# Patient Record
Sex: Male | Born: 1960 | Race: White | Hispanic: No | Marital: Married | Smoking: Never smoker
Health system: Southern US, Community
[De-identification: ages and names within clinical notes are randomized; demographics above are authoritative.]

## PROBLEM LIST (undated history)

## (undated) DIAGNOSIS — E119 Type 2 diabetes mellitus without complications: Secondary | ICD-10-CM

## (undated) DIAGNOSIS — G629 Polyneuropathy, unspecified: Secondary | ICD-10-CM

---

## 2008-07-16 ENCOUNTER — Encounter: Admission: RE | Admit: 2008-07-16 | Discharge: 2008-07-16 | Payer: Self-pay | Admitting: Orthopedic Surgery

## 2008-08-02 ENCOUNTER — Encounter: Admission: RE | Admit: 2008-08-02 | Discharge: 2008-08-02 | Payer: Self-pay | Admitting: Orthopedic Surgery

## 2016-03-28 DIAGNOSIS — E119 Type 2 diabetes mellitus without complications: Secondary | ICD-10-CM

## 2016-03-28 DIAGNOSIS — J181 Lobar pneumonia, unspecified organism: Secondary | ICD-10-CM

## 2016-03-28 DIAGNOSIS — J9691 Respiratory failure, unspecified with hypoxia: Secondary | ICD-10-CM

## 2016-03-28 DIAGNOSIS — R112 Nausea with vomiting, unspecified: Secondary | ICD-10-CM | POA: Diagnosis not present

## 2016-03-28 DIAGNOSIS — D72829 Elevated white blood cell count, unspecified: Secondary | ICD-10-CM

## 2016-03-29 DIAGNOSIS — D72829 Elevated white blood cell count, unspecified: Secondary | ICD-10-CM | POA: Diagnosis not present

## 2016-03-29 DIAGNOSIS — R112 Nausea with vomiting, unspecified: Secondary | ICD-10-CM | POA: Diagnosis not present

## 2016-03-29 DIAGNOSIS — J181 Lobar pneumonia, unspecified organism: Secondary | ICD-10-CM | POA: Diagnosis not present

## 2016-03-29 DIAGNOSIS — J9691 Respiratory failure, unspecified with hypoxia: Secondary | ICD-10-CM | POA: Diagnosis not present

## 2016-03-30 DIAGNOSIS — E119 Type 2 diabetes mellitus without complications: Secondary | ICD-10-CM

## 2016-03-30 DIAGNOSIS — I10 Essential (primary) hypertension: Secondary | ICD-10-CM

## 2016-03-30 DIAGNOSIS — J181 Lobar pneumonia, unspecified organism: Secondary | ICD-10-CM

## 2016-03-30 DIAGNOSIS — D72829 Elevated white blood cell count, unspecified: Secondary | ICD-10-CM

## 2016-03-30 DIAGNOSIS — R05 Cough: Secondary | ICD-10-CM

## 2016-03-30 DIAGNOSIS — J9691 Respiratory failure, unspecified with hypoxia: Secondary | ICD-10-CM

## 2016-03-30 DIAGNOSIS — R112 Nausea with vomiting, unspecified: Secondary | ICD-10-CM

## 2019-03-24 ENCOUNTER — Inpatient Hospital Stay: Admit: 2019-03-24 | Payer: Commercial Managed Care - PPO | Admitting: Family Medicine

## 2019-03-25 ENCOUNTER — Inpatient Hospital Stay (HOSPITAL_COMMUNITY): Payer: PRIVATE HEALTH INSURANCE

## 2019-03-25 ENCOUNTER — Inpatient Hospital Stay: Payer: Self-pay

## 2019-03-25 ENCOUNTER — Inpatient Hospital Stay (HOSPITAL_COMMUNITY)
Admission: AD | Admit: 2019-03-25 | Discharge: 2019-05-31 | DRG: 004 | Disposition: E | Payer: PRIVATE HEALTH INSURANCE | Source: Other Acute Inpatient Hospital | Attending: Pulmonary Disease | Admitting: Pulmonary Disease

## 2019-03-25 DIAGNOSIS — A4189 Other specified sepsis: Principal | ICD-10-CM | POA: Diagnosis present

## 2019-03-25 DIAGNOSIS — G9341 Metabolic encephalopathy: Secondary | ICD-10-CM | POA: Diagnosis not present

## 2019-03-25 DIAGNOSIS — I10 Essential (primary) hypertension: Secondary | ICD-10-CM | POA: Diagnosis present

## 2019-03-25 DIAGNOSIS — Z930 Tracheostomy status: Secondary | ICD-10-CM

## 2019-03-25 DIAGNOSIS — E8770 Fluid overload, unspecified: Secondary | ICD-10-CM | POA: Diagnosis not present

## 2019-03-25 DIAGNOSIS — E876 Hypokalemia: Secondary | ICD-10-CM | POA: Diagnosis present

## 2019-03-25 DIAGNOSIS — J15211 Pneumonia due to Methicillin susceptible Staphylococcus aureus: Secondary | ICD-10-CM | POA: Diagnosis present

## 2019-03-25 DIAGNOSIS — Z66 Do not resuscitate: Secondary | ICD-10-CM | POA: Diagnosis not present

## 2019-03-25 DIAGNOSIS — E669 Obesity, unspecified: Secondary | ICD-10-CM | POA: Diagnosis present

## 2019-03-25 DIAGNOSIS — E875 Hyperkalemia: Secondary | ICD-10-CM | POA: Diagnosis not present

## 2019-03-25 DIAGNOSIS — T380X5A Adverse effect of glucocorticoids and synthetic analogues, initial encounter: Secondary | ICD-10-CM | POA: Diagnosis present

## 2019-03-25 DIAGNOSIS — Z794 Long term (current) use of insulin: Secondary | ICD-10-CM

## 2019-03-25 DIAGNOSIS — Z6834 Body mass index (BMI) 34.0-34.9, adult: Secondary | ICD-10-CM | POA: Diagnosis not present

## 2019-03-25 DIAGNOSIS — J1289 Other viral pneumonia: Secondary | ICD-10-CM | POA: Diagnosis present

## 2019-03-25 DIAGNOSIS — G9340 Encephalopathy, unspecified: Secondary | ICD-10-CM | POA: Diagnosis not present

## 2019-03-25 DIAGNOSIS — J9501 Hemorrhage from tracheostomy stoma: Secondary | ICD-10-CM | POA: Diagnosis not present

## 2019-03-25 DIAGNOSIS — I4891 Unspecified atrial fibrillation: Secondary | ICD-10-CM | POA: Diagnosis not present

## 2019-03-25 DIAGNOSIS — D6489 Other specified anemias: Secondary | ICD-10-CM | POA: Diagnosis not present

## 2019-03-25 DIAGNOSIS — E870 Hyperosmolality and hypernatremia: Secondary | ICD-10-CM | POA: Diagnosis not present

## 2019-03-25 DIAGNOSIS — E785 Hyperlipidemia, unspecified: Secondary | ICD-10-CM | POA: Diagnosis present

## 2019-03-25 DIAGNOSIS — J810 Acute pulmonary edema: Secondary | ICD-10-CM | POA: Diagnosis not present

## 2019-03-25 DIAGNOSIS — Z9289 Personal history of other medical treatment: Secondary | ICD-10-CM

## 2019-03-25 DIAGNOSIS — Z79899 Other long term (current) drug therapy: Secondary | ICD-10-CM

## 2019-03-25 DIAGNOSIS — Z9911 Dependence on respirator [ventilator] status: Secondary | ICD-10-CM | POA: Diagnosis not present

## 2019-03-25 DIAGNOSIS — E860 Dehydration: Secondary | ICD-10-CM | POA: Diagnosis not present

## 2019-03-25 DIAGNOSIS — L8990 Pressure ulcer of unspecified site, unspecified stage: Secondary | ICD-10-CM | POA: Insufficient documentation

## 2019-03-25 DIAGNOSIS — M7989 Other specified soft tissue disorders: Secondary | ICD-10-CM | POA: Diagnosis not present

## 2019-03-25 DIAGNOSIS — E440 Moderate protein-calorie malnutrition: Secondary | ICD-10-CM | POA: Diagnosis present

## 2019-03-25 DIAGNOSIS — J9601 Acute respiratory failure with hypoxia: Secondary | ICD-10-CM

## 2019-03-25 DIAGNOSIS — J151 Pneumonia due to Pseudomonas: Secondary | ICD-10-CM | POA: Diagnosis present

## 2019-03-25 DIAGNOSIS — E874 Mixed disorder of acid-base balance: Secondary | ICD-10-CM | POA: Diagnosis present

## 2019-03-25 DIAGNOSIS — Z9049 Acquired absence of other specified parts of digestive tract: Secondary | ICD-10-CM | POA: Diagnosis not present

## 2019-03-25 DIAGNOSIS — R6521 Severe sepsis with septic shock: Secondary | ICD-10-CM | POA: Diagnosis present

## 2019-03-25 DIAGNOSIS — Z833 Family history of diabetes mellitus: Secondary | ICD-10-CM | POA: Diagnosis not present

## 2019-03-25 DIAGNOSIS — J9690 Respiratory failure, unspecified, unspecified whether with hypoxia or hypercapnia: Secondary | ICD-10-CM

## 2019-03-25 DIAGNOSIS — R009 Unspecified abnormalities of heart beat: Secondary | ICD-10-CM | POA: Diagnosis not present

## 2019-03-25 DIAGNOSIS — J9600 Acute respiratory failure, unspecified whether with hypoxia or hypercapnia: Secondary | ICD-10-CM

## 2019-03-25 DIAGNOSIS — Z0189 Encounter for other specified special examinations: Secondary | ICD-10-CM

## 2019-03-25 DIAGNOSIS — Y848 Other medical procedures as the cause of abnormal reaction of the patient, or of later complication, without mention of misadventure at the time of the procedure: Secondary | ICD-10-CM | POA: Diagnosis not present

## 2019-03-25 DIAGNOSIS — E11649 Type 2 diabetes mellitus with hypoglycemia without coma: Secondary | ICD-10-CM | POA: Diagnosis not present

## 2019-03-25 DIAGNOSIS — N179 Acute kidney failure, unspecified: Secondary | ICD-10-CM | POA: Diagnosis present

## 2019-03-25 DIAGNOSIS — Z4659 Encounter for fitting and adjustment of other gastrointestinal appliance and device: Secondary | ICD-10-CM | POA: Diagnosis not present

## 2019-03-25 DIAGNOSIS — J80 Acute respiratory distress syndrome: Secondary | ICD-10-CM | POA: Diagnosis not present

## 2019-03-25 DIAGNOSIS — E1165 Type 2 diabetes mellitus with hyperglycemia: Secondary | ICD-10-CM | POA: Diagnosis present

## 2019-03-25 DIAGNOSIS — R0600 Dyspnea, unspecified: Secondary | ICD-10-CM | POA: Diagnosis not present

## 2019-03-25 DIAGNOSIS — U071 COVID-19: Secondary | ICD-10-CM

## 2019-03-25 DIAGNOSIS — Z7982 Long term (current) use of aspirin: Secondary | ICD-10-CM

## 2019-03-25 DIAGNOSIS — J9602 Acute respiratory failure with hypercapnia: Secondary | ICD-10-CM | POA: Diagnosis not present

## 2019-03-25 DIAGNOSIS — J189 Pneumonia, unspecified organism: Secondary | ICD-10-CM | POA: Diagnosis not present

## 2019-03-25 DIAGNOSIS — Z0184 Encounter for antibody response examination: Secondary | ICD-10-CM

## 2019-03-25 DIAGNOSIS — A498 Other bacterial infections of unspecified site: Secondary | ICD-10-CM | POA: Diagnosis not present

## 2019-03-25 DIAGNOSIS — L89156 Pressure-induced deep tissue damage of sacral region: Secondary | ICD-10-CM | POA: Diagnosis not present

## 2019-03-25 DIAGNOSIS — R509 Fever, unspecified: Secondary | ICD-10-CM | POA: Diagnosis not present

## 2019-03-25 DIAGNOSIS — Z452 Encounter for adjustment and management of vascular access device: Secondary | ICD-10-CM

## 2019-03-25 DIAGNOSIS — Z7189 Other specified counseling: Secondary | ICD-10-CM | POA: Diagnosis not present

## 2019-03-25 DIAGNOSIS — J1282 Pneumonia due to coronavirus disease 2019: Secondary | ICD-10-CM | POA: Diagnosis present

## 2019-03-25 DIAGNOSIS — L89892 Pressure ulcer of other site, stage 2: Secondary | ICD-10-CM | POA: Diagnosis not present

## 2019-03-25 HISTORY — DX: Type 2 diabetes mellitus without complications: E11.9

## 2019-03-25 HISTORY — DX: Polyneuropathy, unspecified: G62.9

## 2019-03-25 LAB — CBC WITH DIFFERENTIAL/PLATELET
Abs Immature Granulocytes: 0.93 10*3/uL — ABNORMAL HIGH (ref 0.00–0.07)
Basophils Absolute: 0 10*3/uL (ref 0.0–0.1)
Basophils Relative: 0 %
Eosinophils Absolute: 0 10*3/uL (ref 0.0–0.5)
Eosinophils Relative: 0 %
HCT: 37.1 % — ABNORMAL LOW (ref 39.0–52.0)
Hemoglobin: 11.5 g/dL — ABNORMAL LOW (ref 13.0–17.0)
Immature Granulocytes: 4 %
Lymphocytes Relative: 6 %
Lymphs Abs: 1.4 10*3/uL (ref 0.7–4.0)
MCH: 28.5 pg (ref 26.0–34.0)
MCHC: 31 g/dL (ref 30.0–36.0)
MCV: 91.8 fL (ref 80.0–100.0)
Monocytes Absolute: 1.3 10*3/uL — ABNORMAL HIGH (ref 0.1–1.0)
Monocytes Relative: 5 %
Neutro Abs: 21.5 10*3/uL — ABNORMAL HIGH (ref 1.7–7.7)
Neutrophils Relative %: 85 %
Platelets: 625 10*3/uL — ABNORMAL HIGH (ref 150–400)
RBC: 4.04 MIL/uL — ABNORMAL LOW (ref 4.22–5.81)
RDW: 14.1 % (ref 11.5–15.5)
WBC: 25.2 10*3/uL — ABNORMAL HIGH (ref 4.0–10.5)
nRBC: 0.1 % (ref 0.0–0.2)

## 2019-03-25 LAB — COMPREHENSIVE METABOLIC PANEL
ALT: 18 U/L (ref 0–44)
AST: 28 U/L (ref 15–41)
Albumin: 2.7 g/dL — ABNORMAL LOW (ref 3.5–5.0)
Alkaline Phosphatase: 106 U/L (ref 38–126)
Anion gap: 9 (ref 5–15)
BUN: 14 mg/dL (ref 6–20)
CO2: 25 mmol/L (ref 22–32)
Calcium: 8.1 mg/dL — ABNORMAL LOW (ref 8.9–10.3)
Chloride: 102 mmol/L (ref 98–111)
Creatinine, Ser: 0.83 mg/dL (ref 0.61–1.24)
GFR calc Af Amer: >60 mL/min (ref >60)
GFR calc non Af Amer: >60 mL/min (ref >60)
Glucose, Bld: 294 mg/dL — ABNORMAL HIGH (ref 70–99)
Potassium: 5.4 mmol/L — ABNORMAL HIGH (ref 3.5–5.1)
Sodium: 136 mmol/L (ref 135–145)
Total Bilirubin: 0.4 mg/dL (ref 0.3–1.2)
Total Protein: 7.2 g/dL (ref 6.5–8.1)

## 2019-03-25 LAB — POCT I-STAT 7, (LYTES, BLD GAS, ICA,H+H)
Acid-base deficit: 2 mmol/L (ref 0.0–2.0)
Acid-base deficit: 6 mmol/L — ABNORMAL HIGH (ref 0.0–2.0)
Acid-base deficit: 3 mmol/L — ABNORMAL HIGH (ref 0.0–2.0)
Bicarbonate: 23.6 mmol/L (ref 20.0–28.0)
Bicarbonate: 26 mmol/L (ref 20.0–28.0)
Bicarbonate: 23.9 mmol/L (ref 20.0–28.0)
Calcium, Ion: 1.15 mmol/L (ref 1.15–1.40)
Calcium, Ion: 1.24 mmol/L (ref 1.15–1.40)
Calcium, Ion: 1.17 mmol/L (ref 1.15–1.40)
HCT: 31 % — ABNORMAL LOW (ref 39.0–52.0)
HCT: 37 % — ABNORMAL LOW (ref 39.0–52.0)
HCT: 31 % — ABNORMAL LOW (ref 39.0–52.0)
Hemoglobin: 10.5 g/dL — ABNORMAL LOW (ref 13.0–17.0)
Hemoglobin: 12.6 g/dL — ABNORMAL LOW (ref 13.0–17.0)
Hemoglobin: 10.5 g/dL — ABNORMAL LOW (ref 13.0–17.0)
O2 Saturation: 88 %
O2 Saturation: 97 %
O2 Saturation: 91 %
Patient temperature: 36.3
Patient temperature: 37.3
Patient temperature: 37.4
Potassium: 5.1 mmol/L (ref 3.5–5.1)
Potassium: 5.2 mmol/L — ABNORMAL HIGH (ref 3.5–5.1)
Potassium: 5.5 mmol/L — ABNORMAL HIGH (ref 3.5–5.1)
Sodium: 135 mmol/L (ref 135–145)
Sodium: 138 mmol/L (ref 135–145)
Sodium: 136 mmol/L (ref 135–145)
TCO2: 25 mmol/L (ref 22–32)
TCO2: 29 mmol/L (ref 22–32)
TCO2: 25 mmol/L (ref 22–32)
pCO2 arterial: 43.6 mmHg (ref 32.0–48.0)
pCO2 arterial: 87.7 mmHg (ref 32.0–48.0)
pCO2 arterial: 52.3 mmHg — ABNORMAL HIGH (ref 32.0–48.0)
pH, Arterial: 7.337 — ABNORMAL LOW (ref 7.350–7.450)
pH, Arterial: 7.082 — CL (ref 7.350–7.450)
pH, Arterial: 7.269 — ABNORMAL LOW (ref 7.350–7.450)
pO2, Arterial: 56 mmHg — ABNORMAL LOW (ref 83.0–108.0)
pO2, Arterial: 127 mmHg — ABNORMAL HIGH (ref 83.0–108.0)
pO2, Arterial: 72 mmHg — ABNORMAL LOW (ref 83.0–108.0)

## 2019-03-25 LAB — FERRITIN: Ferritin: 235 ng/mL (ref 24–336)

## 2019-03-25 LAB — GLUCOSE, CAPILLARY
Glucose-Capillary: 284 mg/dL — ABNORMAL HIGH (ref 70–99)
Glucose-Capillary: 288 mg/dL — ABNORMAL HIGH (ref 70–99)
Glucose-Capillary: 281 mg/dL — ABNORMAL HIGH (ref 70–99)
Glucose-Capillary: 309 mg/dL — ABNORMAL HIGH (ref 70–99)

## 2019-03-25 LAB — PROCALCITONIN: Procalcitonin: 0.27 ng/mL

## 2019-03-25 LAB — C-REACTIVE PROTEIN: CRP: 23.1 mg/dL — ABNORMAL HIGH (ref <1.0)

## 2019-03-25 LAB — D-DIMER, QUANTITATIVE: D-Dimer, Quant: 5.71 ug/mL-FEU — ABNORMAL HIGH (ref 0.00–0.50)

## 2019-03-25 LAB — HEMOGLOBIN A1C
Hgb A1c MFr Bld: 7.5 % — ABNORMAL HIGH (ref 4.8–5.6)
Mean Plasma Glucose: 168.55 mg/dL

## 2019-03-25 LAB — ABO/RH: ABO/RH(D): A POS

## 2019-03-25 LAB — HIV ANTIBODY (ROUTINE TESTING W REFLEX): HIV Screen 4th Generation wRfx: NONREACTIVE

## 2019-03-25 MED ORDER — INSULIN ASPART 100 UNIT/ML ~~LOC~~ SOLN
0.0000 [IU] | SUBCUTANEOUS | Status: DC
  Administered 2019-03-25: 15 [IU] via SUBCUTANEOUS
  Administered 2019-03-25 – 2019-03-26 (×4): 11 [IU] via SUBCUTANEOUS
  Administered 2019-03-26 (×3): 7 [IU] via SUBCUTANEOUS
  Administered 2019-03-26: 11 [IU] via SUBCUTANEOUS
  Administered 2019-03-27: 15 [IU] via SUBCUTANEOUS
  Administered 2019-03-27: 7 [IU] via SUBCUTANEOUS
  Administered 2019-03-27: 20 [IU] via SUBCUTANEOUS
  Administered 2019-03-27 (×2): 11 [IU] via SUBCUTANEOUS
  Administered 2019-03-27: 15 [IU] via SUBCUTANEOUS
  Administered 2019-03-28: 11 [IU] via SUBCUTANEOUS
  Administered 2019-03-28: 4 [IU] via SUBCUTANEOUS
  Administered 2019-03-28: 16:00:00 20 [IU] via SUBCUTANEOUS
  Administered 2019-03-28: 11 [IU] via SUBCUTANEOUS
  Administered 2019-03-28: 20:00:00 15 [IU] via SUBCUTANEOUS
  Administered 2019-03-28: 11 [IU] via SUBCUTANEOUS
  Administered 2019-03-28: 15 [IU] via SUBCUTANEOUS
  Administered 2019-03-29: 20 [IU] via SUBCUTANEOUS
  Administered 2019-03-29 (×4): 15 [IU] via SUBCUTANEOUS
  Administered 2019-03-30 (×4): 20 [IU] via SUBCUTANEOUS

## 2019-03-25 MED ORDER — VECURONIUM BROMIDE 10 MG IV SOLR
INTRAVENOUS | Status: AC
  Administered 2019-03-25: 10 mg via INTRAVENOUS
  Filled 2019-03-25: qty 10

## 2019-03-25 MED ORDER — ONDANSETRON HCL 4 MG/2ML IJ SOLN: 4.0000 mg | Freq: Four times a day (QID) | INTRAMUSCULAR | Status: DC | PRN

## 2019-03-25 MED ORDER — HYDROMORPHONE BOLUS VIA INFUSION: 0.5000 mg | INTRAVENOUS | Status: DC | PRN

## 2019-03-25 MED ORDER — ZINC SULFATE 220 (50 ZN) MG PO CAPS
220.0000 mg | ORAL_CAPSULE | Freq: Every day | ORAL | Status: DC
  Administered 2019-03-25: 220 mg via ORAL
  Filled 2019-03-25: qty 1

## 2019-03-25 MED ORDER — CLONAZEPAM 0.1 MG/ML ORAL SUSPENSION
1.0000 mg | Freq: Two times a day (BID) | ORAL | Status: DC
  Filled 2019-03-25 (×3): qty 10

## 2019-03-25 MED ORDER — SODIUM CHLORIDE 0.9 % IV SOLN: 0.5000 mg/h | INTRAVENOUS | Status: DC

## 2019-03-25 MED ORDER — ACETAMINOPHEN 325 MG PO TABS: 650.0000 mg | ORAL_TABLET | Freq: Four times a day (QID) | ORAL | Status: DC | PRN

## 2019-03-25 MED ORDER — HYDROCOD POLST-CPM POLST ER 10-8 MG/5ML PO SUER: 5.0000 mL | Freq: Two times a day (BID) | ORAL | Status: DC | PRN

## 2019-03-25 MED ORDER — VECURONIUM BROMIDE 10 MG IV SOLR
0.0000 ug/kg/min | INTRAVENOUS | Status: DC
  Administered 2019-03-25: 1 ug/kg/min via INTRAVENOUS
  Administered 2019-03-26: 0.7 ug/kg/min via INTRAVENOUS
  Filled 2019-03-25 (×4): qty 100

## 2019-03-25 MED ORDER — VITAL HIGH PROTEIN PO LIQD
1000.0000 mL | ORAL | Status: DC
  Administered 2019-03-25: 09:00:00 1000 mL

## 2019-03-25 MED ORDER — VITAL AF 1.2 CAL PO LIQD
1000.0000 mL | ORAL | Status: DC
  Administered 2019-03-26 – 2019-03-27 (×2): 1000 mL

## 2019-03-25 MED ORDER — FENTANYL 2500MCG IN NS 250ML (10MCG/ML) PREMIX INFUSION
50.0000 ug/h | INTRAVENOUS | Status: DC
  Administered 2019-03-25: 250 ug/h via INTRAVENOUS
  Administered 2019-03-25 – 2019-03-27 (×4): 200 ug/h via INTRAVENOUS
  Filled 2019-03-25 (×3): qty 250

## 2019-03-25 MED ORDER — FENTANYL BOLUS VIA INFUSION
50.0000 ug | INTRAVENOUS | Status: DC | PRN
  Filled 2019-03-25: qty 50

## 2019-03-25 MED ORDER — CLONAZEPAM 1 MG PO TABS
1.0000 mg | ORAL_TABLET | Freq: Two times a day (BID) | ORAL | Status: DC
  Administered 2019-03-26 – 2019-04-15 (×40): 1 mg
  Filled 2019-03-25 (×41): qty 1

## 2019-03-25 MED ORDER — CHLORHEXIDINE GLUCONATE 0.12 % MT SOLN
15.0000 mL | Freq: Two times a day (BID) | OROMUCOSAL | Status: DC
  Administered 2019-03-25 – 2019-04-30 (×72): 15 mL via OROMUCOSAL
  Filled 2019-03-25 (×40): qty 15

## 2019-03-25 MED ORDER — VITAMIN C 500 MG PO TABS
500.0000 mg | ORAL_TABLET | Freq: Every day | ORAL | Status: DC
  Administered 2019-03-26 – 2019-04-02 (×8): 500 mg
  Filled 2019-03-25 (×8): qty 1

## 2019-03-25 MED ORDER — SENNA 8.6 MG PO TABS
1.0000 | ORAL_TABLET | Freq: Two times a day (BID) | ORAL | Status: DC
  Administered 2019-03-26 – 2019-03-28 (×6): 8.6 mg
  Filled 2019-03-25 (×6): qty 1

## 2019-03-25 MED ORDER — DEXAMETHASONE SODIUM PHOSPHATE 10 MG/ML IJ SOLN
6.0000 mg | INTRAMUSCULAR | Status: DC
  Administered 2019-03-25 – 2019-03-30 (×6): 6 mg via INTRAVENOUS
  Filled 2019-03-25 (×5): qty 1

## 2019-03-25 MED ORDER — MIDAZOLAM HCL 2 MG/2ML IJ SOLN: 2.0000 mg | INTRAMUSCULAR | Status: DC | PRN

## 2019-03-25 MED ORDER — ORAL CARE MOUTH RINSE
15.0000 mL | OROMUCOSAL | Status: DC
  Administered 2019-03-25 – 2019-04-30 (×365): 15 mL via OROMUCOSAL

## 2019-03-25 MED ORDER — MIDAZOLAM 50MG/50ML (1MG/ML) PREMIX INFUSION
0.5000 mg/h | INTRAVENOUS | Status: DC
  Filled 2019-03-25: qty 50

## 2019-03-25 MED ORDER — PRO-STAT SUGAR FREE PO LIQD
30.0000 mL | Freq: Two times a day (BID) | ORAL | Status: DC
  Administered 2019-03-25: 09:00:00 30 mL
  Filled 2019-03-25: qty 30

## 2019-03-25 MED ORDER — PRO-STAT SUGAR FREE PO LIQD
60.0000 mL | Freq: Three times a day (TID) | ORAL | Status: DC
  Administered 2019-03-26 – 2019-03-28 (×7): 60 mL
  Filled 2019-03-25 (×7): qty 60

## 2019-03-25 MED ORDER — FENTANYL 2500MCG IN NS 250ML (10MCG/ML) PREMIX INFUSION
0.0000 ug/h | INTRAVENOUS | Status: DC
  Administered 2019-03-25: 100 ug/h via INTRAVENOUS
  Filled 2019-03-25 (×2): qty 250

## 2019-03-25 MED ORDER — ONDANSETRON HCL 4 MG PO TABS: 4.0000 mg | ORAL_TABLET | Freq: Four times a day (QID) | ORAL | Status: DC | PRN

## 2019-03-25 MED ORDER — SODIUM CHLORIDE 0.9 % IV SOLN
100.0000 mg | INTRAVENOUS | Status: AC
  Administered 2019-03-26 – 2019-03-29 (×4): 100 mg via INTRAVENOUS
  Filled 2019-03-25 (×4): qty 20

## 2019-03-25 MED ORDER — VITAMIN C 500 MG PO TABS
500.0000 mg | ORAL_TABLET | Freq: Every day | ORAL | Status: DC
  Administered 2019-03-25: 500 mg via ORAL
  Filled 2019-03-25: qty 1

## 2019-03-25 MED ORDER — HYDROCOD POLST-CPM POLST ER 10-8 MG/5ML PO SUER
5.0000 mL | Freq: Two times a day (BID) | ORAL | Status: DC | PRN
  Administered 2019-03-27 – 2019-04-13 (×2): 5 mL
  Filled 2019-03-25 (×2): qty 5

## 2019-03-25 MED ORDER — OXYCODONE HCL 5 MG/5ML PO SOLN
5.0000 mg | Freq: Four times a day (QID) | ORAL | Status: DC
  Administered 2019-03-26 – 2019-03-27 (×5): 5 mg via ORAL
  Filled 2019-03-25 (×6): qty 5

## 2019-03-25 MED ORDER — GUAIFENESIN-DM 100-10 MG/5ML PO SYRP: 10.0000 mL | ORAL_SOLUTION | ORAL | Status: DC | PRN

## 2019-03-25 MED ORDER — ARTIFICIAL TEARS OPHTHALMIC OINT
1.0000 "application " | TOPICAL_OINTMENT | Freq: Three times a day (TID) | OPHTHALMIC | Status: DC
  Administered 2019-03-25 – 2019-03-27 (×6): 1 via OPHTHALMIC
  Filled 2019-03-25: qty 3.5

## 2019-03-25 MED ORDER — BISACODYL 10 MG RE SUPP
10.0000 mg | Freq: Every day | RECTAL | Status: DC | PRN
  Filled 2019-03-25: qty 1

## 2019-03-25 MED ORDER — NOREPINEPHRINE 4 MG/250ML-% IV SOLN
0.0000 ug/min | INTRAVENOUS | Status: DC
  Administered 2019-03-25 – 2019-03-29 (×2): 2 ug/min via INTRAVENOUS
  Administered 2019-03-30: 8 ug/min via INTRAVENOUS
  Administered 2019-03-31: 2 ug/min via INTRAVENOUS
  Administered 2019-04-03: 3 ug/min via INTRAVENOUS
  Administered 2019-04-03: 4 ug/min via INTRAVENOUS
  Administered 2019-04-04: 2 ug/min via INTRAVENOUS
  Filled 2019-03-25 (×7): qty 250

## 2019-03-25 MED ORDER — CHLORHEXIDINE GLUCONATE 0.12% ORAL RINSE (MEDLINE KIT)
15.0000 mL | Freq: Two times a day (BID) | OROMUCOSAL | Status: DC
  Administered 2019-03-25: 15 mL via OROMUCOSAL

## 2019-03-25 MED ORDER — SODIUM CHLORIDE 0.9% IV SOLUTION
Freq: Once | INTRAVENOUS | Status: AC
  Administered 2019-03-25: 10 mL/h via INTRAVENOUS

## 2019-03-25 MED ORDER — ACETAMINOPHEN 325 MG PO TABS
650.0000 mg | ORAL_TABLET | Freq: Four times a day (QID) | ORAL | Status: DC | PRN
  Administered 2019-03-26 – 2019-04-01 (×16): 650 mg
  Filled 2019-03-25 (×16): qty 2

## 2019-03-25 MED ORDER — MIDAZOLAM BOLUS VIA INFUSION
1.0000 mg | INTRAVENOUS | Status: DC | PRN
  Administered 2019-03-25: 2 mg via INTRAVENOUS
  Filled 2019-03-25: qty 2

## 2019-03-25 MED ORDER — VECURONIUM BROMIDE 10 MG IV SOLR
10.0000 mg | Freq: Once | INTRAVENOUS | Status: AC
  Administered 2019-03-25: 12:00:00 10 mg via INTRAVENOUS

## 2019-03-25 MED ORDER — HYDROMORPHONE HCL 1 MG/ML IJ SOLN: 1.0000 mg | Freq: Once | INTRAMUSCULAR | Status: DC

## 2019-03-25 MED ORDER — SENNOSIDES 8.8 MG/5ML PO SYRP
5.0000 mL | ORAL_SOLUTION | Freq: Two times a day (BID) | ORAL | Status: DC | PRN
  Filled 2019-03-25: qty 5

## 2019-03-25 MED ORDER — PANTOPRAZOLE SODIUM 40 MG PO PACK
40.0000 mg | PACK | Freq: Every day | ORAL | Status: DC
  Administered 2019-03-26 – 2019-04-15 (×21): 40 mg
  Filled 2019-03-25 (×23): qty 20

## 2019-03-25 MED ORDER — VECURONIUM BROMIDE 10 MG IV SOLR: 10.0000 mg | INTRAVENOUS | Status: DC | PRN

## 2019-03-25 MED ORDER — ENOXAPARIN SODIUM 60 MG/0.6ML ~~LOC~~ SOLN
55.0000 mg | Freq: Two times a day (BID) | SUBCUTANEOUS | Status: DC
  Administered 2019-03-25 – 2019-04-07 (×26): 55 mg via SUBCUTANEOUS
  Filled 2019-03-25 (×25): qty 0.6

## 2019-03-25 MED ORDER — ZINC SULFATE 220 (50 ZN) MG PO CAPS
220.0000 mg | ORAL_CAPSULE | Freq: Every day | ORAL | Status: DC
  Administered 2019-03-26 – 2019-04-02 (×8): 220 mg
  Filled 2019-03-25 (×8): qty 1

## 2019-03-25 MED ORDER — ENOXAPARIN SODIUM 40 MG/0.4ML ~~LOC~~ SOLN
40.0000 mg | SUBCUTANEOUS | Status: DC
  Administered 2019-03-25: 40 mg via SUBCUTANEOUS
  Filled 2019-03-25: qty 0.4

## 2019-03-25 MED ORDER — MIDAZOLAM 50MG/50ML (1MG/ML) PREMIX INFUSION
2.0000 mg/h | INTRAVENOUS | Status: DC
  Administered 2019-03-25: 4 mg/h via INTRAVENOUS
  Administered 2019-03-25: 5 mg/h via INTRAVENOUS
  Administered 2019-03-26 – 2019-03-27 (×4): 6 mg/h via INTRAVENOUS
  Filled 2019-03-25 (×5): qty 50

## 2019-03-25 MED ORDER — CHLORHEXIDINE GLUCONATE CLOTH 2 % EX PADS
6.0000 | MEDICATED_PAD | Freq: Every day | CUTANEOUS | Status: DC
  Administered 2019-03-25 – 2019-04-29 (×38): 6 via TOPICAL

## 2019-03-25 MED ORDER — SODIUM BICARBONATE 8.4 % IV SOLN
INTRAVENOUS | Status: DC
  Administered 2019-03-25: 15:00:00 via INTRAVENOUS
  Filled 2019-03-25 (×4): qty 150

## 2019-03-25 MED ORDER — SENNA 8.6 MG PO TABS: 1.0000 | ORAL_TABLET | Freq: Two times a day (BID) | ORAL | Status: DC

## 2019-03-25 MED ORDER — STERILE WATER FOR INJECTION IJ SOLN
INTRAMUSCULAR | Status: AC
  Administered 2019-03-25: 12:00:00 10 mL
  Filled 2019-03-25: qty 10

## 2019-03-25 NOTE — Progress Notes (Signed)
NAME:  Angel Stuart, MRN:  [id number], DOB:  13-Feb-1961, LOS: 0 ADMISSION DATE:  03/31/19, CONSULTATION DATE:  10/26 REFERRING MD:  Thereasa Solo, CHIEF COMPLAINT:  Dyspnea   Brief History   58 y/o male admitted on 10/26 in setting of ARDS from [place] 19 pneumonia, required intubation in the [facility] hospital ER.  History of present illness   This is a 58 y/o male with HTN, HLD, DM2 who was admitted to [facility] ICU on 10/26 in the setting of ARDS from COVID 19 pneumonia.  He went to the [facility] ER where he was noted to have an O2 saturation of 53% on RA.  He was briefly treated with BIPAP, then intubated.  He was intubated on arrival here so I could not obtain a history from him.  History was obtained by reviewing records from [place].  Past Medical History  HTN HLD DM2  Significant Hospital Events   10/26 admission to ICU  Consults:  PCCM  Procedures:  10/26 ETT >  10/26 L subclavian CVL >   Significant Diagnostic Tests:    Micro Data:  10/26 SARS COV 2 [place]) > positive  Antimicrobials:  10/26 decadron>  10/26 remdesivir >   Interim history/subjective:    Objective   Blood pressure 98/69, pulse (!) 112, temperature (!) 97.3 F (36.3 C), temperature source Oral, resp. rate (!) 25, height 5\' 11"  (1.803 m), weight 111.4 kg, SpO2 96 %.    Vent Mode: PRVC FiO2 (%):  [100 %] 100 % Set Rate:  [24 bmp-35 bmp] 35 bmp Vt Set:  [450 mL-600 mL] 450 mL PEEP:  [10 cmH20-16 cmH20] 16 cmH20 Plateau Pressure:  [35 cmH20] 35 cmH20  No intake or output data in the 24 hours ending March 31, 2019 1311 Filed Weights   03/31/19 0945  Weight: 111.4 kg    Examination:  General:  In bed on vent HENT: NCAT ETT in place PULM: Crackles bilaterally B, vent supported breathing CV: RRR, no mgr GI: BS+, soft, nontender MSK: normal bulk and tone Derm: diaphoretic Neuro: sedated on vent   Resolved Hospital Problem list     Assessment & Plan:  ARDS due to COVID 19 pneumonia:  severe hypoxemia, worsening hypercarbia Continue mechanical ventilation per ARDS protocol Target TVol 6-8cc/kgIBW Target Plateau Pressure < 30cm H20 Target driving pressure less than 15 cm of water Target PaO2 55-65: titrate PEEP/FiO2 per protocol As long as PaO2 to FiO2 ratio is less than 1:150 position in prone position for 16 hours a day Check CVP daily if CVL in place Target CVP less than 4, diurese as necessary Ventilator associated pneumonia prevention protocol 10/26 given worsening respiratory acidosis, change to 8cc/kg/IBW, start paralytic protocol, prone position, decadron, remdesivir, consent for plasma (if family interested)  Need for sedation for mechanical ventilation: high sedation needs Start oral sedation: oxycodone, clonazepam Start continuous paralytic protocol given severe ventilator dyssynchrony RASS target -5  Admit ICU Check standard labs 12 lead EKG CXR ABG   Best practice:  Diet: start tube feeding Pain/Anxiety/Delirium protocol (if indicated): as above, continuous paralytic protocol VAP protocol (if indicated): yes DVT prophylaxis: lovenox per protocol, discuss COVID PACT with the patient's family GI prophylaxis: pantoprazole Glucose control: SSI Mobility: bed rest Code Status: full Family Communication: per TRH Disposition: remain in ICU  Labs   CBC: Recent Labs  Lab 31-Mar-2019 0932 2019/03/31 1249  HGB 10.5* 12.6*  HCT 31.0* 37.0*    Basic Metabolic Panel: Recent Labs  Lab March 31, 2019 0932 Mar 31, 2019 1249  NA  135 138  K 5.1 5.2*   GFR: CrCl cannot be calculated (No successful lab value found.). No results for input(s): PROCALCITON, WBC, LATICACIDVEN in the last 168 hours.  Liver Function Tests: No results for input(s): AST, ALT, ALKPHOS, BILITOT, PROT, ALBUMIN in the last 168 hours. No results for input(s): LIPASE, AMYLASE in the last 168 hours. No results for input(s): AMMONIA in the last 168 hours.  ABG    Component Value Date/Time    PHART 7.082 (LL) 02/28/2019 1249   PCO2ART 87.7 (HH) 03/07/2019 1249   PO2ART 127.0 (H) 03/14/2019 1249   HCO3 26.0 03/13/2019 1249   TCO2 29 03/28/2019 1249   ACIDBASEDEF 6.0 (H) 03/11/2019 1249   O2SAT 97.0 03/10/2019 1249     Coagulation Profile: No results for input(s): INR, PROTIME in the last 168 hours.  Cardiac Enzymes: No results for input(s): CKTOTAL, CKMB, CKMBINDEX, TROPONINI in the last 168 hours.  HbA1C: No results found for: HGBA1C  CBG: Recent Labs  Lab 03/12/2019 0754  GLUCAP 284*    Review of Systems:   Cannot obtain due to intubation  Past Medical History  Hypertenion, hyperlipidemia, DM2  Surgical History   Cannot obtain due to intubation  Social History    Cannot obtain due to intubation  Family History   Cannot obtain due to intubation  Allergies Not on File   Home Medications  Prior to Admission medications   Medication Sig Start Date End Date Taking? Authorizing Provider  linagliptin (TRADJENTA) 5 MG TABS tablet Take 5 mg by mouth daily. 06/22/16  Yes [provider]  losartan (COZAAR) 50 MG tablet Take 50 mg by mouth daily. 02/01/19   [provider]  rosuvastatin (CRESTOR) 20 MG tablet Take 20 mg by mouth at bedtime. 01/21/19   [provider]     Critical care time: 70     Heber Somers, MD [place] PCCM Pager: [phone] Cell: [phone] If no response, call [phone]

## 2019-03-25 NOTE — Progress Notes (Signed)
Spoke with patient's wife and gave her brief update.  Patient's nurse currently busy with patient care.  Wife will call back to speak with nurse later tonight.

## 2019-03-25 NOTE — Progress Notes (Signed)
Patient prepared for prone position. Foam pads placed on his back of the neck, cheeks, and above the upper lip.  ETT secured in the center with cloth tape at 27 cm. Patient moved to prone bed and then placed in prone position by RT x 2 and RN x 4 without complications.  Z-Flo prone pillow used.

## 2019-03-25 NOTE — Progress Notes (Signed)
Initial Nutrition Assessment RD working remotely.  DOCUMENTATION CODES:   Obesity unspecified  INTERVENTION:    Vital AF 1.2 at 40 ml/h (960 ml per day)   Pro-stat 30 ml TID   Provides 1752 kcal, 162 gm protein, 779 ml free water daily  NUTRITION DIAGNOSIS:   Increased nutrient needs related to acute illness as evidenced by estimated needs.  GOAL:   Provide needs based on ASPEN/SCCM guidelines  MONITOR:   Vent status, Labs, I & O's, TF tolerance  REASON FOR ASSESSMENT:   Ventilator, Consult Enteral/tube feeding initiation and management  ASSESSMENT:   58 yo male admitted with progressive SOB after testing positive for COVID-19 on 10/9 (symptoms started 10/5). PMH includes DM, HLD.   Received MD Consult for TF initiation and management.  Patient is currently intubated on ventilator support MV: 18.9 L/min Temp (24hrs), Avg:97.3 F (36.3 C), Min:97.3 F (36.3 C), Max:97.3 F (36.3 C)    Labs reviewed.  CBG's: 284  Medications reviewed and include decadron, novolog, senokot, vitamin C, zinc sulfate, levophed.  NUTRITION - FOCUSED PHYSICAL EXAM:  deferred  Diet Order:   Diet Order            Diet NPO time specified  Diet effective now              EDUCATION NEEDS:   Not appropriate for education at this time  Skin:  Skin Assessment: Reviewed RN Assessment  Last BM:  no BM documented  Height:   Ht Readings from Last 1 Encounters:  03/12/2019 5\' 11"  (1.803 m)    Weight:   Wt Readings from Last 1 Encounters:  03/08/2019 111.4 kg    Ideal Body Weight:  78.2 kg  BMI:  Body mass index is 34.25 kg/m.  Estimated Nutritional Needs:   Kcal:  1460-1700  Protein:  >/= 156 gm  Fluid:  >/= 1.8 L    Molli Barrows, RD, LDN, [place] Pager [phone] After Hours Pager [phone]

## 2019-03-25 NOTE — Procedures (Signed)
Central Venous Catheter Insertion Procedure Note Angel Stuart [id number] Apr 26, 1961  Procedure: Insertion of Central Venous Catheter Indications: Drug and/or fluid administration  Procedure Details Consent: Unable to obtain consent because of emergent medical necessity. Time Out: Verified patient identification, verified procedure, site/side was marked, verified correct patient position, special equipment/implants available, medications/allergies/relevent history reviewed, required imaging and test results available.  Performed  Maximum sterile technique was used including antiseptics, cap, gloves, gown, hand hygiene, mask and sheet. Skin prep: Chlorhexidine; local anesthetic administered A antimicrobial bonded/coated triple lumen catheter was placed in the left subclavian vein using the Seldinger technique.  Ultrasound was used to verify the patency of the vein and for real time needle guidance.  Evaluation Blood flow good Complications: No apparent complications Patient did tolerate procedure well. Chest X-ray ordered to verify placement.  CXR: normal.  Angel Stuart 2019-04-13, 1:10 PM

## 2019-03-25 NOTE — H&P (Addendum)
History and Physical    Angel Stuart KTG:[id number] DOB: 1960-06-10 DOA: 03/17/2019  PCP: No primary care provider on file. Patient coming from: [facility] ED  Chief Complaint: Shortness of breath  HPI: 58 year old with a history of hyperlipidemia and DM 2 who began to develop upper respiratory symptoms on 10/5, and ultimately tested positive for Covid on 10/9.  Records suggest he experienced a gradual progression of symptoms with acute worsening of shortness of breath on 10/25, which prompted him to present to the [facility] ED.  In the [place] ED he was noted to have a saturation of 63% on room air.  Chest x-ray noted severe bilateral infiltrates.  He rapidly decompensated in the emergency room.  He failed a trial of BiPAP and had to be intubated.  He was subsequently transferred to [facility] to the ICU.  At the time of my exam he is intubated and sedated.  Significant Events: 10/05 symptom onset  10/09 positive COVID test 10/25 presented to [facility] ED - intubated  10/26 transfer to [facility] ICU  Assessment/Plan  Covid pneumonia -ARDS -acute hypoxic respiratory failure Ventilator management per PCCM -Decadron and remdesivir -consider Actemra -consider convalescent plasma  Hypotension - SIRS  Due to above -pressors per PCCM -central line to be placed  DM 2 Status of home management not clear at this time -administer sliding scale insulin -check A1c  Obesity - Body mass index is 34.25 kg/m.   DVT prophylaxis: lovenox  Code Status: FULL Family Communication:  Disposition Plan: ICU Consults called: PCCM   Review of Systems: Unable to be accomplished as patient critically ill/intubated on ventilator  Med and Surgical Hx: HLD, DM S/P cholecystectomy, tonsillectomy, R knee surgery   Family History  Noncontributory per review of records  Social History  Retired EMS - married to Therapist, sports - no tobacco   Allergies Clindamycin (anaphylaxis) Hydromorphone  (nausea/vomiting)  Prior to Admission medications   Not on File    Physical Exam: Vitals:   03/14/2019 0900 03/26/2019 0915 03/15/2019 0930 03/28/2019 0945  BP: (!) 80/61 97/71 93/67 105/72  Pulse: 89 92 92 93  Resp: (!) 33 (!) 34 (!) 36 (!) 35  Temp:      TempSrc:      SpO2: 92% 94% 94% 96%  Weight:    111.4 kg  Height:        Constitutional: Critically ill on ventilator ENMT: Dry mucous membranes Neck: normal, supple, no masses, no thyromegaly Respiratory: Fine crackles throughout all fields with no wheezing Cardiovascular: Tachycardic but regular with no appreciable murmur or rub Abdomen: No masses palpated. No hepatosplenomegaly. Bowel sounds positive.  Musculoskeletal: no clubbing / cyanosis. No joint deformity upper and lower extremities. Good ROM, no contractures. Normal muscle tone.  Skin: Superficial wound mid aspect upper abdomen proximately 1 cm in diameter with no evidence of surrounding induration or fluctuance Neurologic: Sedated on ventilator   Labs on Admission:   WBC 20.9 - Hgb 13.1 MCV 86 PLT larg plt present   INR 1.2 - D-dimer 2390 (<500) - CRP 173.9   Procalcitonin 0.15 - lactic acid 1.9  Na 137 K 4.2 CO2 24 gluc 213 BUN 7 crt 0.5 LDH 1110 SGOT 37 SGPT 198 alk phos 125  Radiological Exams on Admission: Dg Chest Port 1 View  Result Date: 03/03/2019 CLINICAL DATA:  Acute respiratory failure EXAM: PORTABLE CHEST 1 VIEW COMPARISON:  Chest radiograph 03/19/2019 FINDINGS: The endotracheal tube tip terminates between the thoracic inlet and carina. Nasogastric tube remains in place  with distal aspect below the diaphragm and out of field of view. Similar appearance of the lungs with extensive bilateral airspace opacities. No pneumothorax or large pleural effusion. No acute finding in the visualized skeleton. IMPRESSION: Stable support apparatus and extensive bilateral airspace disease. Electronically Signed   By: Audie Pinto M.D.   On: 03/30/2019 09:03   [place] Ekg  Site Rite  Result Date: 03/17/2019 If Site Rite image not attached, placement could not be confirmed due to current cardiac rhythm.   EKG: Independently reviewed.   Cherene Altes, MD [facility] Office  [phone] Pager - Text Page per Amion as per below:  On-Call/Text Page:      [email or web address]  If 7PM-7AM, please contact night-coverage [email or web address] 03/29/2019, 11:50 AM

## 2019-03-25 NOTE — Progress Notes (Signed)
Spoke with pt wife.  Updated on status and all questions answered at this time.

## 2019-03-25 NOTE — Progress Notes (Addendum)
Patients head repositioned. RN x3, RT x2 at bedside. ETT remains secured 27 cm @lip  with cloth tape. Arms rotated. Z-flo prone pillow used.

## 2019-03-25 NOTE — Progress Notes (Signed)
Vent changes made post critical ABG results per Dr. Lake Bells.  VT 600 ( 8ccs).

## 2019-03-25 NOTE — Progress Notes (Signed)
pts wife [name] called and updated on patients condition. All questions answered. Will continue to update.

## 2019-03-25 NOTE — Progress Notes (Signed)
Turned pt.  HR increased to 140s BP increased.  Bolus of Versed given.  HR still 140s and BP increased.  Fentanyl increased.  See MAR.

## 2019-03-25 NOTE — Progress Notes (Signed)
ETT flushed with 10 mL normal saline followed by suctioning.  Small tan/pink secretions obtained without complications.

## 2019-03-25 NOTE — Progress Notes (Addendum)
Patient remains proned. Head repositioned to the right. Arms rotated. ETT secured in center, with cloth tape at 27cm.  Z-Flo prone pillow used.

## 2019-03-26 ENCOUNTER — Inpatient Hospital Stay (HOSPITAL_COMMUNITY): Payer: PRIVATE HEALTH INSURANCE

## 2019-03-26 DIAGNOSIS — G9340 Encephalopathy, unspecified: Secondary | ICD-10-CM

## 2019-03-26 DIAGNOSIS — E8770 Fluid overload, unspecified: Secondary | ICD-10-CM | POA: Diagnosis not present

## 2019-03-26 DIAGNOSIS — J9601 Acute respiratory failure with hypoxia: Secondary | ICD-10-CM | POA: Diagnosis not present

## 2019-03-26 DIAGNOSIS — U071 COVID-19: Secondary | ICD-10-CM | POA: Diagnosis not present

## 2019-03-26 LAB — POCT I-STAT 7, (LYTES, BLD GAS, ICA,H+H)
Acid-Base Excess: 8 mmol/L — ABNORMAL HIGH (ref 0.0–2.0)
Bicarbonate: 32 mmol/L — ABNORMAL HIGH (ref 20.0–28.0)
Calcium, Ion: 1.11 mmol/L — ABNORMAL LOW (ref 1.15–1.40)
HCT: 27 % — ABNORMAL LOW (ref 39.0–52.0)
Hemoglobin: 9.2 g/dL — ABNORMAL LOW (ref 13.0–17.0)
O2 Saturation: 99 %
Potassium: 4.4 mmol/L (ref 3.5–5.1)
Sodium: 140 mmol/L (ref 135–145)
TCO2: 33 mmol/L — ABNORMAL HIGH (ref 22–32)
pCO2 arterial: 41.9 mmHg (ref 32.0–48.0)
pH, Arterial: 7.49 — ABNORMAL HIGH (ref 7.350–7.450)
pO2, Arterial: 119 mmHg — ABNORMAL HIGH (ref 83.0–108.0)

## 2019-03-26 LAB — CBC WITH DIFFERENTIAL/PLATELET
Abs Immature Granulocytes: 0.18 10*3/uL — ABNORMAL HIGH (ref 0.00–0.07)
Basophils Absolute: 0 10*3/uL (ref 0.0–0.1)
Basophils Relative: 0 %
Eosinophils Absolute: 0 10*3/uL (ref 0.0–0.5)
Eosinophils Relative: 0 %
HCT: 31.5 % — ABNORMAL LOW (ref 39.0–52.0)
Hemoglobin: 9.8 g/dL — ABNORMAL LOW (ref 13.0–17.0)
Immature Granulocytes: 1 %
Lymphocytes Relative: 10 %
Lymphs Abs: 1.6 10*3/uL (ref 0.7–4.0)
MCH: 28.1 pg (ref 26.0–34.0)
MCHC: 31.1 g/dL (ref 30.0–36.0)
MCV: 90.3 fL (ref 80.0–100.0)
Monocytes Absolute: 1.3 10*3/uL — ABNORMAL HIGH (ref 0.1–1.0)
Monocytes Relative: 8 %
Neutro Abs: 13 10*3/uL — ABNORMAL HIGH (ref 1.7–7.7)
Neutrophils Relative %: 81 %
Platelets: 544 10*3/uL — ABNORMAL HIGH (ref 150–400)
RBC: 3.49 MIL/uL — ABNORMAL LOW (ref 4.22–5.81)
RDW: 14 % (ref 11.5–15.5)
WBC: 16.1 10*3/uL — ABNORMAL HIGH (ref 4.0–10.5)
nRBC: 0.1 % (ref 0.0–0.2)

## 2019-03-26 LAB — GLUCOSE, CAPILLARY
Glucose-Capillary: 201 mg/dL — ABNORMAL HIGH (ref 70–99)
Glucose-Capillary: 209 mg/dL — ABNORMAL HIGH (ref 70–99)
Glucose-Capillary: 224 mg/dL — ABNORMAL HIGH (ref 70–99)
Glucose-Capillary: 244 mg/dL — ABNORMAL HIGH (ref 70–99)
Glucose-Capillary: 276 mg/dL — ABNORMAL HIGH (ref 70–99)
Glucose-Capillary: 286 mg/dL — ABNORMAL HIGH (ref 70–99)
Glucose-Capillary: 293 mg/dL — ABNORMAL HIGH (ref 70–99)

## 2019-03-26 LAB — COMPREHENSIVE METABOLIC PANEL
ALT: 17 U/L (ref 0–44)
AST: 27 U/L (ref 15–41)
Albumin: 2.3 g/dL — ABNORMAL LOW (ref 3.5–5.0)
Alkaline Phosphatase: 90 U/L (ref 38–126)
Anion gap: 9 (ref 5–15)
BUN: 28 mg/dL — ABNORMAL HIGH (ref 6–20)
CO2: 30 mmol/L (ref 22–32)
Calcium: 7.9 mg/dL — ABNORMAL LOW (ref 8.9–10.3)
Chloride: 102 mmol/L (ref 98–111)
Creatinine, Ser: 1.2 mg/dL (ref 0.61–1.24)
GFR calc Af Amer: >60 mL/min (ref >60)
GFR calc non Af Amer: >60 mL/min (ref >60)
Glucose, Bld: 204 mg/dL — ABNORMAL HIGH (ref 70–99)
Potassium: 4.4 mmol/L (ref 3.5–5.1)
Sodium: 141 mmol/L (ref 135–145)
Total Bilirubin: 0.2 mg/dL — ABNORMAL LOW (ref 0.3–1.2)
Total Protein: 6.4 g/dL — ABNORMAL LOW (ref 6.5–8.1)

## 2019-03-26 LAB — MAGNESIUM: Magnesium: 1.9 mg/dL (ref 1.7–2.4)

## 2019-03-26 LAB — PREPARE FRESH FROZEN PLASMA

## 2019-03-26 LAB — BPAM FFP
Blood Product Expiration Date: 202010271618
ISSUE DATE / TIME: 202010261645
Unit Type and Rh: 6200

## 2019-03-26 LAB — PROCALCITONIN: Procalcitonin: 0.21 ng/mL

## 2019-03-26 LAB — C-REACTIVE PROTEIN: CRP: 16.6 mg/dL — ABNORMAL HIGH (ref <1.0)

## 2019-03-26 LAB — FERRITIN: Ferritin: 267 ng/mL (ref 24–336)

## 2019-03-26 LAB — MRSA PCR SCREENING: MRSA by PCR: NEGATIVE

## 2019-03-26 LAB — D-DIMER, QUANTITATIVE: D-Dimer, Quant: 3.78 ug/mL-FEU — ABNORMAL HIGH (ref 0.00–0.50)

## 2019-03-26 MED ORDER — METOLAZONE 10 MG PO TABS
10.0000 mg | ORAL_TABLET | Freq: Once | ORAL | Status: AC
  Administered 2019-03-26: 10 mg via ORAL
  Filled 2019-03-26: qty 1

## 2019-03-26 MED ORDER — INSULIN GLARGINE 100 UNIT/ML ~~LOC~~ SOLN
24.0000 [IU] | Freq: Every day | SUBCUTANEOUS | Status: DC
  Administered 2019-03-26 – 2019-03-28 (×3): 24 [IU] via SUBCUTANEOUS
  Filled 2019-03-26 (×4): qty 0.24

## 2019-03-26 MED ORDER — FUROSEMIDE 10 MG/ML IJ SOLN
40.0000 mg | Freq: Four times a day (QID) | INTRAMUSCULAR | Status: AC
  Administered 2019-03-26 – 2019-03-27 (×3): 40 mg via INTRAVENOUS
  Filled 2019-03-26 (×3): qty 4

## 2019-03-26 NOTE — Progress Notes (Signed)
NAME:  Angel Stuart, MRN:  [id number], DOB:  [birth year], LOS: 1 ADMISSION DATE:  03/20/2019, CONSULTATION DATE:  10/26 REFERRING MD:  Sharon Seller, CHIEF COMPLAINT:  Dyspnea   Brief History   58 y/o male admitted on 10/26 in setting of ARDS from COVID 19 pneumonia, required intubation in the [facility] hospital ER.  History of present illness   This is a 58 y/o male with HTN, HLD, DM2 who was admitted to [facility] ICU on 10/26 in the setting of ARDS from COVID 19 pneumonia.  He went to the [facility] ER where he was noted to have an O2 saturation of 53% on RA.  He was briefly treated with BIPAP, then intubated.  He was intubated on arrival here so I could not obtain a history from him.  History was obtained by reviewing records from [place].  Past Medical History  HTN HLD DM2  Significant Hospital Events   10/26 admission to ICU  Consults:  PCCM  Procedures:  10/26 ETT >  10/26 L subclavian CVL >   Significant Diagnostic Tests:    Micro Data:  10/26 SARS COV 2 [place]) > positive  Antimicrobials:  10/26 decadron>  10/26 remdesivir >   Interim history/subjective:    Objective   Blood pressure 105/68, pulse (!) 105, temperature 99.9 F (37.7 C), temperature source Axillary, resp. rate (!) 35, height 5\' 11"  (1.803 m), weight 114.1 kg, SpO2 100 %. CVP:  [19 mmHg] 19 mmHg  Vent Mode: PRVC FiO2 (%):  [70 %-100 %] 80 % Set Rate:  [35 bmp] 35 bmp Vt Set:  [450 mL-600 mL] 600 mL PEEP:  [16 cmH20] 16 cmH20 Plateau Pressure:  [33 cmH20-43 cmH20] 34 cmH20   Intake/Output Summary (Last 24 hours) at 03/26/2019 0920 Last data filed at 03/26/2019 0900 Gross per 24 hour  Intake 3088.71 ml  Output 725 ml  Net 2363.71 ml   Filed Weights   03/29/2019 0945 03/26/19 0500  Weight: 111.4 kg 114.1 kg   Examination:  General:  Acutely ill appearing male, NAD on vent HENT: [place]/AT, PERRL, EOM-I and MMM, ETT in place PULM: Diffuse crackles CV: RRR, Nl S1/S2 and -M/R/G GI: Soft,  NT, ND and +BS MSK: 1+ edema and -tenderness Neuro: Sedated and intubated Skin: Intact  I reviewed CXR myself, ETT too high and infiltrate noted  Resolved Hospital Problem list   N/A  Assessment & Plan:  ARDS due to COVID 19 pneumonia: severe hypoxemia, worsening hypercarbia Continue mechanical ventilation per ARDS protocol Target TVol 6-8cc/kgIBW Target Plateau Pressure < 30cm H20 Target driving pressure less than 15 cm of water Target PaO2 55-65: titrate PEEP/FiO2 per protocol As long as PaO2 to FiO2 ratio is less than 1:150 position in prone position for 16 hours a day Follow CVP with target of 4 Hold diureses given pressor need but will need to Ventilator associated pneumonia prevention protocol 10/26 given worsening respiratory acidosis, change to 8cc/kg/IBW, start paralytic protocol, prone position, decadron, remdesivir, consent for plasma (if family interested) Active diureses today  Need for sedation for mechanical ventilation: high sedation needs Start oral sedation: oxycodone, clonazepam Start continuous paralytic protocol given severe ventilator dyssynchrony RASS target -5  Admit ICU Check standard labs 12 lead EKG CXR ABG   Best practice:  Diet: start tube feeding Pain/Anxiety/Delirium protocol (if indicated): as above, continuous paralytic protocol VAP protocol (if indicated): yes DVT prophylaxis: lovenox per protocol, discuss COVID PACT with the patient's family GI prophylaxis: pantoprazole Glucose control: SSI Mobility: bed rest Code  Status: full Family Communication: per [facility] Disposition: remain in ICU  Labs   CBC: Recent Labs  Lab 03/13/2019 0932 03/17/2019 1249 03/22/2019 1300 03/24/2019 1442 03/26/19 0529  WBC  --   --  25.2*  --  16.1*  NEUTROABS  --   --  21.5*  --  13.0*  HGB 10.5* 12.6* 11.5* 10.5* 9.8*  HCT 31.0* 37.0* 37.1* 31.0* 31.5*  MCV  --   --  91.8  --  90.3  PLT  --   --  625*  --  544*    Basic Metabolic Panel: Recent Labs   Lab 03/07/2019 0932 03/26/2019 1249 03/07/2019 1300 03/11/2019 1442 03/26/19 0529  NA 135 138 136 136 141  K 5.1 5.2* 5.4* 5.5* 4.4  CL  --   --  102  --  102  CO2  --   --  25  --  30  GLUCOSE  --   --  294*  --  204*  BUN  --   --  14  --  28*  CREATININE  --   --  0.83  --  1.20  CALCIUM  --   --  8.1*  --  7.9*  MG  --   --   --   --  1.9   GFR: Estimated Creatinine Clearance: 85.1 mL/min (by C-G formula based on SCr of 1.2 mg/dL). Recent Labs  Lab 03/02/2019 1300 03/26/19 0529  PROCALCITON 0.27 0.21  WBC 25.2* 16.1*    Liver Function Tests: Recent Labs  Lab 03/30/2019 1300 03/26/19 0529  AST 28 27  ALT 18 17  ALKPHOS 106 90  BILITOT 0.4 0.2*  PROT 7.2 6.4*  ALBUMIN 2.7* 2.3*   No results for input(s): LIPASE, AMYLASE in the last 168 hours. No results for input(s): AMMONIA in the last 168 hours.  ABG    Component Value Date/Time   PHART 7.269 (L) 03/20/2019 1442   PCO2ART 52.3 (H) 03/16/2019 1442   PO2ART 72.0 (L) 03/24/2019 1442   HCO3 23.9 03/26/2019 1442   TCO2 25 03/30/2019 1442   ACIDBASEDEF 3.0 (H) 03/15/2019 1442   O2SAT 91.0 03/23/2019 1442     Coagulation Profile: No results for input(s): INR, PROTIME in the last 168 hours.  Cardiac Enzymes: No results for input(s): CKTOTAL, CKMB, CKMBINDEX, TROPONINI in the last 168 hours.  HbA1C: Hgb A1c MFr Bld  Date/Time Value Ref Range Status  03/18/2019 01:00 PM 7.5 (H) 4.8 - 5.6 % Final    Comment:    (NOTE) Pre diabetes:          5.7%-6.4% Diabetes:              >6.4% Glycemic control for   <7.0% adults with diabetes     CBG: Recent Labs  Lab 03/14/2019 2028 02/28/2019 2354 03/26/19 0335 03/26/19 0425 03/26/19 0806  GLUCAP 309* 276* 224* 209* 201*   The patient is critically ill with multiple organ systems failure and requires high complexity decision making for assessment and support, frequent evaluation and titration of therapies, application of advanced monitoring technologies and extensive  interpretation of multiple databases.   Critical Care Time devoted to patient care services described in this note is  32  Minutes. This time reflects time of care of this signee Dr [name]. This critical care time does not reflect procedure time, or teaching time or supervisory time of PA/NP/Med student/Med Resident etc but could involve care discussion time.  Rush Farmer, M.D. [facility]  Pulmonary/Critical Care Medicine.

## 2019-03-26 NOTE — Progress Notes (Signed)
ETT flushed with 11mL normal saline.

## 2019-03-26 NOTE — Progress Notes (Signed)
Patient head repositioned to the left. Arms rotated. Z-Flow proning pillow in place. ETT remains secured 27 cm @ lip. RNx3 RT at bedside. Tolerated well.

## 2019-03-26 NOTE — Progress Notes (Signed)
ETT flushed with 77mL normal saline per CCm Q-shift.  Small, thick yellow secretions obtained.

## 2019-03-26 NOTE — Progress Notes (Signed)
ETT flushed with 10 mL normal saline. Small clear/tan secretions obtained. Patient tolerated well.

## 2019-03-26 NOTE — Progress Notes (Signed)
Rt Note:  Patient remains proned. Head repositioned to the right.  ETT remains secured 27 cm @lip .  Arms rotated.  Z-flo pillow in use. Tolerated well. No issues.

## 2019-03-26 NOTE — Progress Notes (Signed)
Spoke to RN, patient does not need PICC at this time. Temporary subclavian was placed yesterday and PICC is not needed tonight as per RN. Order will be D/C'd by RN if PICC is not needed.

## 2019-03-26 NOTE — Progress Notes (Signed)
Patient turned to supine position by RT x 2 and RN x 5 without complications.  ETT re-secured with a commercial tube holder at 27 cm on the left.

## 2019-03-26 NOTE — Progress Notes (Signed)
Inpatient Diabetes Program Recommendations  AACE/ADA: New Consensus Statement on Inpatient Glycemic Control   Target Ranges:  Prepandial:   less than 140 mg/dL      Peak postprandial:   less than 180 mg/dL (1-2 hours)      Critically ill patients:  140 - 180 mg/dL   Results for SYLUS, STGERMAIN (MRN [id number]) as of 03/26/2019 11:35  Ref. Range 04/12/19 07:54 2019-04-12 13:28 04-12-2019 17:36 04-12-2019 20:28 April 12, 2019 23:54 03/26/2019 03:35 03/26/2019 04:25 03/26/2019 08:06  Glucose-Capillary Latest Ref Range: 70 - 99 mg/dL 284 (H)   288 (H)  Novolog 11 units 281 (H)  Novolog 11 units 309 (H)  Novolog 15 units 276 (H)  Novolog 11 units 224 (H)  Novolog 7 units 209 (H) 201 (H)  Novolog 7 units  Results for CONWAY, FEDORA (MRN [id number]) as of 03/26/2019 11:35  Ref. Range 04/12/19 13:00  Hemoglobin A1C Latest Ref Range: 4.8 - 5.6 % 7.5 (H)   Review of Glycemic Control  Diabetes history: DM2 Outpatient Diabetes medications: Lantus 60 units daily, Tradjenta 5 mg daily, Metformin 1000 mg BID Current orders for Inpatient glycemic control: Novolog 0-20 units Q4H; Decadron 6 mg Q24H, Vital @ 40 ml/hr  Inpatient Diabetes Program Recommendations:   Insulin-Basal: Please consider ordering Lantus 23 units Q24H starting now (based on 114 kg x 0.2 units).  Insulin-Tube Feeding Coverage: Please consider ordering Novolog 4 units Q4H for tube feeding coverage. If tube feeding is stopped or held then Novolog tube feeding coverage should also be stopped or held.  Thanks, Barnie Alderman, RN, MSN, CDE Diabetes Coordinator Inpatient Diabetes Program [phone] (Team Pager from 8am to 5pm)

## 2019-03-26 NOTE — Progress Notes (Addendum)
Angel Stuart  OIP:[id number] DOB: 01/22/1961 DOA: 04-10-2019 PCP: Lenox Ponds, MD    Brief Narrative:  58 year old with a history of hyperlipidemia and DM 2 who began to develop upper respiratory symptoms on 10/5, and ultimately tested positive for Covid on 10/9.  Records suggest he experienced a gradual progression of symptoms with acute worsening of shortness of breath on 10/25, which prompted him to present to the [facility] ED.  In the [place] ED he was noted to have a saturation of 63% on room air.  Chest x-ray noted severe bilateral infiltrates.  He rapidly decompensated in the emergency room.  He failed a trial of BiPAP and had to be intubated.  He was subsequently transferred to [facility] to the ICU.  At the time of my exam he is intubated and sedated.  Significant Events: 10/05 symptom onset  10/09 positive COVID test 10/25 presented to [facility] ED - intubated  10/26 transfer to [facility] ICU  COVID-19 specific Treatment: Decadron 10/26 > Remdesivir 10/26 > Convalescent plasma 10/27  Subjective: Sedated on ventilator in prone position.  Assessment & Plan:  Covid pneumonia -ARDS -acute hypoxic respiratory failure Ventilator management per PCCM - Decadron and remdesivir continue - transfused convalescent plasma 10/27 AM  Recent Labs  Lab 2019/04/10 1300 03/26/19 0529  DDIMER 5.71* 3.78*  FERRITIN 235 267  CRP 23.1* 16.6*  ALT 18 17  PROCALCITON 0.27 0.21    Hypotension - SIRS  Due to above -pressors per PCCM -blood pressure has stabilized on norepinephrine  DM 2 A1c 7.5 -CBG not at goal -adjust treatment and follow trend  Obesity - Estimated body mass index is 35.08 kg/m as calculated from the following:   Height as of this encounter: 5\' 11"  (1.803 m).   Weight as of this encounter: 114.1 kg.   DVT prophylaxis: lovenox  Code Status: FULL CODE Family Communication: called and spoke w/ patient's wife at 7:30PM Disposition Plan: ICU   Consultants:  none  Antimicrobials:  None  Objective: Blood pressure 105/68, pulse (!) 105, temperature 99.2 F (37.3 C), resp. rate (!) 35, height 5\' 11"  (1.803 m), weight 114.1 kg, SpO2 100 %.  Intake/Output Summary (Last 24 hours) at 03/26/2019 0830 Last data filed at 03/26/2019 0600 Gross per 24 hour  Intake 2638.34 ml  Output 625 ml  Net 2013.34 ml   Filed Weights   Apr 10, 2019 0945 03/26/19 0500  Weight: 111.4 kg 114.1 kg    Examination: General: In prone position and sedated Lungs: Fine diffuse crackles Cardiovascular: RRR without murmur Abdomen: Obese, soft Extremities: No significant edema bilateral lower extremities  CBC: Recent Labs  Lab 10-Apr-2019 1300 Apr 10, 2019 1442 03/26/19 0529  WBC 25.2*  --  16.1*  NEUTROABS 21.5*  --  13.0*  HGB 11.5* 10.5* 9.8*  HCT 37.1* 31.0* 31.5*  MCV 91.8  --  90.3  PLT 625*  --  544*   Basic Metabolic Panel: Recent Labs  Lab 10-Apr-2019 1300 2019/04/10 1442 03/26/19 0529  NA 136 136 141  K 5.4* 5.5* 4.4  CL 102  --  102  CO2 25  --  30  GLUCOSE 294*  --  204*  BUN 14  --  28*  CREATININE 0.83  --  1.20  CALCIUM 8.1*  --  7.9*  MG  --   --  1.9   GFR: Estimated Creatinine Clearance: 85.1 mL/min (by C-G formula based on SCr of 1.2 mg/dL).  Liver Function Tests: Recent Labs  Lab 2019-04-10 1300 03/26/19  0529  AST 28 27  ALT 18 17  ALKPHOS 106 90  BILITOT 0.4 0.2*  PROT 7.2 6.4*  ALBUMIN 2.7* 2.3*    HbA1C: Hgb A1c MFr Bld  Date/Time Value Ref Range Status  02/28/2019 01:00 PM 7.5 (H) 4.8 - 5.6 % Final    Comment:    (NOTE) Pre diabetes:          5.7%-6.4% Diabetes:              >6.4% Glycemic control for   <7.0% adults with diabetes     CBG: Recent Labs  Lab 03/09/2019 2028 03/27/2019 2354 03/26/19 0335 03/26/19 0425 03/26/19 0806  GLUCAP 309* 276* 224* 209* 201*    Recent Results (from the past 240 hour(s))  MRSA PCR Screening     Status: None   Collection Time: 03/07/2019  8:57 PM   Specimen:  Nasal Mucosa; Nasopharyngeal  Result Value Ref Range Status   MRSA by PCR NEGATIVE NEGATIVE Final    Comment:        The GeneXpert MRSA Assay (FDA approved for NASAL specimens only), is one component of a comprehensive MRSA colonization surveillance program. It is not intended to diagnose MRSA infection nor to guide or monitor treatment for MRSA infections. Performed at [facility], [place] [address] [place], [place] [address]      Scheduled Meds: . artificial tears  1 application Both Eyes G2I  . chlorhexidine  15 mL Mouth/Throat BID  . Chlorhexidine Gluconate Cloth  6 each Topical Daily  . clonazePAM  1 mg Per Tube BID  . dexamethasone (DECADRON) injection  6 mg Intravenous Q24H  . enoxaparin (LOVENOX) injection  55 mg Subcutaneous Q12H  . feeding supplement (PRO-STAT SUGAR FREE 64)  60 mL Per Tube TID  . insulin aspart  0-20 Units Subcutaneous Q4H  . mouth rinse  15 mL Mouth Rinse 10 times per day  . oxyCODONE  5 mg Oral Q6H  . pantoprazole sodium  40 mg Per Tube Daily  . senna  1 tablet Per Tube BID  . vitamin C  500 mg Per Tube Daily  . zinc sulfate  220 mg Per Tube Daily   Continuous Infusions: . feeding supplement (VITAL AF 1.2 CAL)    . fentaNYL infusion INTRAVENOUS 200 mcg/hr (03/26/19 0650)  . midazolam 6 mg/hr (03/26/19 0749)  . norepinephrine (LEVOPHED) Adult infusion Stopped (03/17/2019 1847)  . remdesivir 100 mg in NS 250 mL    .  sodium bicarbonate  infusion 1000 mL 100 mL/hr at 03/01/2019 1900  . vecuronium (NORCURON) infusion 0.5 mcg/kg/min (03/15/2019 1900)     LOS: 1 day   Cherene Altes, MD [facility] Office  [phone] Pager - Text Page per Amion  If 7PM-7AM, please contact night-coverage per Amion 03/26/2019, 8:30 AM

## 2019-03-26 NOTE — Progress Notes (Signed)
Wife called and updated on patient status and plan of care. Let her know that Ventilator requirements were decreasing and he tolerated being supine well. No further questions at this time.

## 2019-03-27 ENCOUNTER — Inpatient Hospital Stay (HOSPITAL_COMMUNITY): Payer: PRIVATE HEALTH INSURANCE

## 2019-03-27 DIAGNOSIS — G9340 Encephalopathy, unspecified: Secondary | ICD-10-CM | POA: Diagnosis not present

## 2019-03-27 DIAGNOSIS — E1165 Type 2 diabetes mellitus with hyperglycemia: Secondary | ICD-10-CM

## 2019-03-27 DIAGNOSIS — U071 COVID-19: Secondary | ICD-10-CM | POA: Diagnosis not present

## 2019-03-27 DIAGNOSIS — J810 Acute pulmonary edema: Secondary | ICD-10-CM

## 2019-03-27 DIAGNOSIS — J9601 Acute respiratory failure with hypoxia: Secondary | ICD-10-CM | POA: Diagnosis not present

## 2019-03-27 LAB — COMPREHENSIVE METABOLIC PANEL
ALT: 21 U/L (ref 0–44)
AST: 39 U/L (ref 15–41)
Albumin: 2.4 g/dL — ABNORMAL LOW (ref 3.5–5.0)
Alkaline Phosphatase: 82 U/L (ref 38–126)
Anion gap: 13 (ref 5–15)
BUN: 45 mg/dL — ABNORMAL HIGH (ref 6–20)
CO2: 33 mmol/L — ABNORMAL HIGH (ref 22–32)
Calcium: 8.7 mg/dL — ABNORMAL LOW (ref 8.9–10.3)
Chloride: 94 mmol/L — ABNORMAL LOW (ref 98–111)
Creatinine, Ser: 1.7 mg/dL — ABNORMAL HIGH (ref 0.61–1.24)
GFR calc Af Amer: 50 mL/min — ABNORMAL LOW (ref >60)
GFR calc non Af Amer: 43 mL/min — ABNORMAL LOW (ref >60)
Glucose, Bld: 263 mg/dL — ABNORMAL HIGH (ref 70–99)
Potassium: 3.8 mmol/L (ref 3.5–5.1)
Sodium: 140 mmol/L (ref 135–145)
Total Bilirubin: 0.4 mg/dL (ref 0.3–1.2)
Total Protein: 6.8 g/dL (ref 6.5–8.1)

## 2019-03-27 LAB — POCT I-STAT 7, (LYTES, BLD GAS, ICA,H+H)
Acid-Base Excess: 13 mmol/L — ABNORMAL HIGH (ref 0.0–2.0)
Acid-Base Excess: 11 mmol/L — ABNORMAL HIGH (ref 0.0–2.0)
Bicarbonate: 37.4 mmol/L — ABNORMAL HIGH (ref 20.0–28.0)
Bicarbonate: 35.3 mmol/L — ABNORMAL HIGH (ref 20.0–28.0)
Calcium, Ion: 1.17 mmol/L (ref 1.15–1.40)
Calcium, Ion: 1.18 mmol/L (ref 1.15–1.40)
HCT: 29 % — ABNORMAL LOW (ref 39.0–52.0)
HCT: 30 % — ABNORMAL LOW (ref 39.0–52.0)
Hemoglobin: 9.9 g/dL — ABNORMAL LOW (ref 13.0–17.0)
Hemoglobin: 10.2 g/dL — ABNORMAL LOW (ref 13.0–17.0)
O2 Saturation: 95 %
O2 Saturation: 86 %
Patient temperature: 101.4
Patient temperature: 101.5
Potassium: 3.8 mmol/L (ref 3.5–5.1)
Potassium: 4.2 mmol/L (ref 3.5–5.1)
Sodium: 138 mmol/L (ref 135–145)
Sodium: 138 mmol/L (ref 135–145)
TCO2: 39 mmol/L — ABNORMAL HIGH (ref 22–32)
TCO2: 37 mmol/L — ABNORMAL HIGH (ref 22–32)
pCO2 arterial: 48.9 mmHg — ABNORMAL HIGH (ref 32.0–48.0)
pCO2 arterial: 49.7 mmHg — ABNORMAL HIGH (ref 32.0–48.0)
pH, Arterial: 7.497 — ABNORMAL HIGH (ref 7.350–7.450)
pH, Arterial: 7.465 — ABNORMAL HIGH (ref 7.350–7.450)
pO2, Arterial: 77 mmHg — ABNORMAL LOW (ref 83.0–108.0)
pO2, Arterial: 54 mmHg — ABNORMAL LOW (ref 83.0–108.0)

## 2019-03-27 LAB — GLUCOSE, CAPILLARY
Glucose-Capillary: 242 mg/dL — ABNORMAL HIGH (ref 70–99)
Glucose-Capillary: 272 mg/dL — ABNORMAL HIGH (ref 70–99)
Glucose-Capillary: 287 mg/dL — ABNORMAL HIGH (ref 70–99)
Glucose-Capillary: 313 mg/dL — ABNORMAL HIGH (ref 70–99)
Glucose-Capillary: 329 mg/dL — ABNORMAL HIGH (ref 70–99)
Glucose-Capillary: 362 mg/dL — ABNORMAL HIGH (ref 70–99)

## 2019-03-27 LAB — CBC WITH DIFFERENTIAL/PLATELET
Abs Immature Granulocytes: 0.1 10*3/uL — ABNORMAL HIGH (ref 0.00–0.07)
Basophils Absolute: 0 10*3/uL (ref 0.0–0.1)
Basophils Relative: 0 %
Eosinophils Absolute: 0 10*3/uL (ref 0.0–0.5)
Eosinophils Relative: 0 %
HCT: 32.1 % — ABNORMAL LOW (ref 39.0–52.0)
Hemoglobin: 10 g/dL — ABNORMAL LOW (ref 13.0–17.0)
Immature Granulocytes: 1 %
Lymphocytes Relative: 15 %
Lymphs Abs: 1.8 10*3/uL (ref 0.7–4.0)
MCH: 28 pg (ref 26.0–34.0)
MCHC: 31.2 g/dL (ref 30.0–36.0)
MCV: 89.9 fL (ref 80.0–100.0)
Monocytes Absolute: 1.3 10*3/uL — ABNORMAL HIGH (ref 0.1–1.0)
Monocytes Relative: 11 %
Neutro Abs: 8.6 10*3/uL — ABNORMAL HIGH (ref 1.7–7.7)
Neutrophils Relative %: 73 %
Platelets: 476 10*3/uL — ABNORMAL HIGH (ref 150–400)
RBC: 3.57 MIL/uL — ABNORMAL LOW (ref 4.22–5.81)
RDW: 13.9 % (ref 11.5–15.5)
WBC: 11.8 10*3/uL — ABNORMAL HIGH (ref 4.0–10.5)
nRBC: 0 % (ref 0.0–0.2)

## 2019-03-27 LAB — FERRITIN: Ferritin: 286 ng/mL (ref 24–336)

## 2019-03-27 LAB — MAGNESIUM: Magnesium: 1.8 mg/dL (ref 1.7–2.4)

## 2019-03-27 LAB — C-REACTIVE PROTEIN: CRP: 9.7 mg/dL — ABNORMAL HIGH (ref <1.0)

## 2019-03-27 LAB — TRIGLYCERIDES: Triglycerides: 79 mg/dL (ref <150)

## 2019-03-27 LAB — D-DIMER, QUANTITATIVE: D-Dimer, Quant: 3.11 ug/mL-FEU — ABNORMAL HIGH (ref 0.00–0.50)

## 2019-03-27 LAB — PROCALCITONIN: Procalcitonin: 0.22 ng/mL

## 2019-03-27 LAB — PHOSPHORUS: Phosphorus: 3 mg/dL (ref 2.5–4.6)

## 2019-03-27 MED ORDER — DEXMEDETOMIDINE HCL IN NACL 400 MCG/100ML IV SOLN
0.4000 ug/kg/h | INTRAVENOUS | Status: DC
  Administered 2019-03-27: 0.4 ug/kg/h via INTRAVENOUS
  Filled 2019-03-27: qty 100

## 2019-03-27 MED ORDER — FENTANYL 2500MCG IN NS 250ML (10MCG/ML) PREMIX INFUSION
50.0000 ug/h | INTRAVENOUS | Status: DC
  Administered 2019-03-27 (×2): 200 ug/h via INTRAVENOUS
  Filled 2019-03-27: qty 250

## 2019-03-27 MED ORDER — METOLAZONE 10 MG PO TABS
10.0000 mg | ORAL_TABLET | Freq: Once | ORAL | Status: AC
  Administered 2019-03-27: 10 mg via ORAL
  Filled 2019-03-27: qty 1

## 2019-03-27 MED ORDER — PROPOFOL 1000 MG/100ML IV EMUL
5.0000 ug/kg/min | INTRAVENOUS | Status: DC
  Administered 2019-03-27: 40 ug/kg/min via INTRAVENOUS
  Administered 2019-03-27: 30 ug/kg/min via INTRAVENOUS
  Administered 2019-03-27: 5 ug/kg/min via INTRAVENOUS
  Administered 2019-03-28: 30 ug/kg/min via INTRAVENOUS
  Administered 2019-03-28: 40 ug/kg/min via INTRAVENOUS
  Administered 2019-03-28 (×2): 30 ug/kg/min via INTRAVENOUS
  Administered 2019-03-29: 40 ug/kg/min via INTRAVENOUS
  Administered 2019-03-29: 20 ug/kg/min via INTRAVENOUS
  Administered 2019-03-29: 40 ug/kg/min via INTRAVENOUS
  Filled 2019-03-27 (×11): qty 100

## 2019-03-27 MED ORDER — FENTANYL BOLUS VIA INFUSION
50.0000 ug | INTRAVENOUS | Status: DC | PRN
  Administered 2019-03-29 – 2019-04-02 (×12): 50 ug via INTRAVENOUS
  Filled 2019-03-27: qty 50

## 2019-03-27 MED ORDER — FUROSEMIDE 10 MG/ML IJ SOLN
40.0000 mg | Freq: Three times a day (TID) | INTRAMUSCULAR | Status: AC
  Administered 2019-03-27 (×2): 40 mg via INTRAVENOUS
  Filled 2019-03-27 (×2): qty 4

## 2019-03-27 MED ORDER — POTASSIUM CHLORIDE 20 MEQ/15ML (10%) PO SOLN
40.0000 meq | Freq: Once | ORAL | Status: AC
  Administered 2019-03-27: 40 meq
  Filled 2019-03-27: qty 30

## 2019-03-27 MED ORDER — INSULIN GLARGINE 100 UNIT/ML ~~LOC~~ SOLN
10.0000 [IU] | Freq: Every day | SUBCUTANEOUS | Status: DC
  Administered 2019-03-27: 10 [IU] via SUBCUTANEOUS
  Filled 2019-03-27 (×2): qty 0.1

## 2019-03-27 MED ORDER — OXYCODONE HCL 5 MG/5ML PO SOLN
5.0000 mg | Freq: Four times a day (QID) | ORAL | Status: DC
  Administered 2019-03-27 – 2019-04-01 (×19): 5 mg
  Filled 2019-03-27 (×19): qty 5

## 2019-03-27 NOTE — Procedures (Signed)
Cortrak  Person Inserting Tube:  Choua Chalker C, RD Tube Type:  Cortrak - 43 inches Tube Location:  Left nare Initial Placement:  Postpyloric Secured by: Bridle Technique Used to Measure Tube Placement:  Documented cm marking at nare/ corner of mouth Cortrak Secured At:  89 cm    Cortrak Tube Team Note:  Consult received to place a Cortrak feeding tube.   No x-ray is required. RN may begin using tube.   If the tube becomes dislodged please keep the tube and contact the Cortrak team at [email or web address] (password [email or web address]) for replacement.  If after hours and replacement cannot be delayed, place a NG tube and confirm placement with an abdominal x-ray.    Garnetta Fedrick RD, LDN, CNSC [phone] Pager [phone] After Hours Pager   

## 2019-03-27 NOTE — Progress Notes (Signed)
Spoke to patient's wife, [name] and gave her updates on patient's care and condition. Answered all of Melissa's questions regarding patient care. She will call back later and check on patient.

## 2019-03-27 NOTE — Progress Notes (Signed)
NAME:  Arun Herrod, MRN:  [id number], DOB:  1960/10/18, LOS: 2 ADMISSION DATE:  04/02/19, CONSULTATION DATE:  10/26 REFERRING MD:  Thereasa Solo, CHIEF COMPLAINT:  Dyspnea   Brief History   58 y/o male admitted on 10/26 in setting of ARDS from [place] 19 pneumonia, required intubation in the [facility] hospital ER.  History of present illness   This is a 58 y/o male with HTN, HLD, DM2 who was admitted to [facility] ICU on 10/26 in the setting of ARDS from COVID 19 pneumonia.  He went to the [place] ER where he was noted to have an O2 saturation of 53% on RA.  He was briefly treated with BIPAP, then intubated.  He was intubated on arrival here so I could not obtain a history from him.  History was obtained by reviewing records from [place].  Past Medical History  HTN HLD DM2  Significant Hospital Events   10/26 admission to ICU  Consults:  PCCM  Procedures:  10/26 ETT >  10/26 L subclavian CVL >   Significant Diagnostic Tests:    Micro Data:  10/26 SARS COV 2 [place]) > positive  Antimicrobials:  10/26 decadron>  10/26 remdesivir >   Interim history/subjective:  Tolerated supine positioning well overnight FiO2 and PEEP demand improving Diuresed well overnight but Cr increased  Objective   Blood pressure (!) 162/90, pulse (!) 113, temperature (!) 101.7 F (38.7 C), temperature source Axillary, resp. rate (!) 35, height 5\' 11"  (1.803 m), weight 109.2 kg, SpO2 98 %.    Vent Mode: PRVC FiO2 (%):  [50 %-60 %] 50 % Set Rate:  [35 bmp] 35 bmp Vt Set:  [600 mL] 600 mL PEEP:  [10 cmH20-14 cmH20] 10 cmH20 Plateau Pressure:  [27 cmH20-31 cmH20] 30 cmH20   Intake/Output Summary (Last 24 hours) at 03/27/2019 0823 Last data filed at 03/27/2019 0500 Gross per 24 hour  Intake 1486.42 ml  Output 4225 ml  Net -2738.58 ml   Filed Weights   04/02/19 0945 03/26/19 0500 03/27/19 0500  Weight: 111.4 kg 114.1 kg 109.2 kg   Examination:  General:  Acutely ill appearing  male, on vent, NAD HENT: [place]/AT, PERRL, EOM-I and MMM, ETT in place at 28 cm PULM: Coarse diffusely CV: RRR, Nl S1/S2 and -M/R/G GI: Soft, NT, ND and +BS MSK: 1+ edema and -tenderness Neuro: Sedated and intubated Skin: Intact  I reviewed CXR myself, ETT remains high but below cords and infiltrate noted  Discussed with TRH-MD  Resolved Hospital Problem list   N/A  Assessment & Plan:  ARDS due to COVID 19 pneumonia: severe hypoxemia, worsening hypercarbia Continue mechanical ventilation per ARDS protocol Target TVol 6-8cc/kgIBW Target Plateau Pressure < 30cm H20 Target driving pressure less than 15 cm of water Target PaO2 55-65: titrate PEEP/FiO2 per protocol As long as PaO2 to FiO2 ratio is less than 1:150 position in prone position for 16 hours a day Follow CVP with target of 4 Decrease lasix given Cr bump but continue to diurese Ventilator associated pneumonia prevention protocol Goal is 40 and 8 today D/C proning D/C versed drip and change to PRN pushes Add precedex but careful with hemodynamics  Need for sedation for mechanical ventilation: high sedation needs Continue oral sedation: oxycodone, clonazepam D/C paralytic orderset, no longer needing proning Change RASS score to -2 as target  PCCM will continue to follow  Best practice:  Diet: start tube feeding Pain/Anxiety/Delirium protocol (if indicated): as above, continuous paralytic protocol VAP protocol (if indicated): yes  DVT prophylaxis: lovenox per protocol, discuss COVID PACT with the patient's family GI prophylaxis: pantoprazole Glucose control: SSI Mobility: bed rest Code Status: full Family Communication: per [facility] Disposition: remain in ICU  Labs   CBC: Recent Labs  Lab 03/23/2019 1300 03/21/2019 1442 03/26/19 0529 03/26/19 1319 03/27/19 0445 03/27/19 0523  WBC 25.2*  --  16.1*  --  11.8*  --   NEUTROABS 21.5*  --  13.0*  --  8.6*  --   HGB 11.5* 10.5* 9.8* 9.2* 10.0* 9.9*  HCT 37.1* 31.0* 31.5*  27.0* 32.1* 29.0*  MCV 91.8  --  90.3  --  89.9  --   PLT 625*  --  544*  --  476*  --     Basic Metabolic Panel: Recent Labs  Lab 03/29/2019 1300 03/24/2019 1442 03/26/19 0529 03/26/19 1319 03/27/19 0445 03/27/19 0523  NA 136 136 141 140 140 138  K 5.4* 5.5* 4.4 4.4 3.8 3.8  CL 102  --  102  --  94*  --   CO2 25  --  30  --  33*  --   GLUCOSE 294*  --  204*  --  263*  --   BUN 14  --  28*  --  45*  --   CREATININE 0.83  --  1.20  --  1.70*  --   CALCIUM 8.1*  --  7.9*  --  8.7*  --   MG  --   --  1.9  --  1.8  --   PHOS  --   --   --   --  3.0  --    GFR: Estimated Creatinine Clearance: 58.8 mL/min (A) (by C-G formula based on SCr of 1.7 mg/dL (H)). Recent Labs  Lab 02/28/2019 1300 03/26/19 0529 03/27/19 0445  PROCALCITON 0.27 0.21  --   WBC 25.2* 16.1* 11.8*    Liver Function Tests: Recent Labs  Lab 03/06/2019 1300 03/26/19 0529 03/27/19 0445  AST 28 27 39  ALT 18 17 21   ALKPHOS 106 90 82  BILITOT 0.4 0.2* 0.4  PROT 7.2 6.4* 6.8  ALBUMIN 2.7* 2.3* 2.4*   No results for input(s): LIPASE, AMYLASE in the last 168 hours. No results for input(s): AMMONIA in the last 168 hours.  ABG    Component Value Date/Time   PHART 7.497 (H) 03/27/2019 0523   PCO2ART 48.9 (H) 03/27/2019 0523   PO2ART 77.0 (L) 03/27/2019 0523   HCO3 37.4 (H) 03/27/2019 0523   TCO2 39 (H) 03/27/2019 0523   ACIDBASEDEF 3.0 (H) 03/19/2019 1442   O2SAT 95.0 03/27/2019 0523     Coagulation Profile: No results for input(s): INR, PROTIME in the last 168 hours.  Cardiac Enzymes: No results for input(s): CKTOTAL, CKMB, CKMBINDEX, TROPONINI in the last 168 hours.  HbA1C: Hgb A1c MFr Bld  Date/Time Value Ref Range Status  03/01/2019 01:00 PM 7.5 (H) 4.8 - 5.6 % Final    Comment:    (NOTE) Pre diabetes:          5.7%-6.4% Diabetes:              >6.4% Glycemic control for   <7.0% adults with diabetes     CBG: Recent Labs  Lab 03/26/19 1202 03/26/19 1602 03/26/19 2043 03/27/19 0024  03/27/19 0356  GLUCAP 244* 286* 293* 272* 287*   The patient is critically ill with multiple organ systems failure and requires high complexity decision making for assessment and support, frequent evaluation and  titration of therapies, application of advanced monitoring technologies and extensive interpretation of multiple databases.   Critical Care Time devoted to patient care services described in this note is  32  Minutes. This time reflects time of care of this signee Dr Koren BoundWesam Yacoub. This critical care time does not reflect procedure time, or teaching time or supervisory time of PA/NP/Med student/Med Resident etc but could involve care discussion time.  Alyson ReedyWesam G. Yacoub, M.D. [facility].

## 2019-03-27 NOTE — Progress Notes (Signed)
PROGRESS NOTE  Angel Stuart ZOX:[id number] DOB: 08/16/1960 DOA: 03/17/2019  PCP: Lenox PondsSilva Zapata, Edwin, MD  Brief History/Interval Summary: 58 year old with a history of hyperlipidemia and DM 2 who began to develop upper respiratory symptoms on 10/5, and ultimately tested positive for Covid on 10/9. Records suggest he experienced a gradual progression of symptoms with acute worsening of shortness of breath on 10/25, which prompted him to [facility]. In [facility] he was noted to have a saturation of 63% on room air. Chest x-ray noted severe bilateral infiltrates. He rapidly decompensated in the emergency room. He failed a trial of BiPAP and had to be intubated. [facility]. At the time of my exam he is intubated and sedated.  Reason for Visit: Acute respiratory failure with hypoxia.  Pneumonia due to COVID-19  Consultants: Pulmonology  Procedures: [facility]  Antibiotics: Anti-infectives (From admission, onward)   Start     Dose/Rate Route Frequency Ordered Stop   03/26/19 1000  remdesivir 100 mg in sodium chloride 0.9 % 250 mL IVPB     100 mg 500 mL/hr over 30 Minutes Intravenous Every 24 hours 03/24/2019 1031 03/30/19 0959      Subjective/Interval History: Patient is intubated and sedated.   Assessment/Plan:  Acute Hypoxic Resp. Failure/Pneumonia due to COVID-19  Vent Mode: PRVC FiO2 (%):  [40 %-60 %] 40 % Set Rate:  [24 bmp-35 bmp] 24 bmp Vt Set:  [600 mL] 600 mL PEEP:  [8 cmH20-12 cmH20] 10 cmH20 Plateau Pressure:  [26 cmH20-31 cmH20] 26 cmH20     Component Value Date/Time   PHART 7.465 (H) 03/27/2019 1141   PCO2ART 49.7 (H) 03/27/2019 1141   PO2ART 54.0 (L) 03/27/2019 1141   HCO3 35.3 (H) 03/27/2019 1141   TCO2 37 (H) 03/27/2019 1141   ACIDBASEDEF 3.0 (H) 03/30/2019 1442   O2SAT 86.0 03/27/2019 1141    COVID-19 Labs  Recent Labs    03/24/2019 1300 03/26/19 0529  03/27/19 0445  DDIMER 5.71* 3.78* 3.11*  FERRITIN 235 267 286  CRP 23.1* 16.6* 9.7*    Patient had positive COVID-19 result on 10/9.  This [facility] or one of the clinics in that area.  Fever: Noted to be febrile with T-max of 101.7 Oxygen requirements: 50% FiO2.  Saturating in the 90s. Antibacterials: None Remdesivir: Day 3 Steroids: Dexamethasone 6 mg daily Diuretics: Given 3 doses of Lasix yesterday and 1 dose of metolazone. Actemra: Not given Convalescent plasma: Transfused on 10/26 Vitamin C and Zinc: Continue DVT Prophylaxis:  Lovenox 55 mg every 12 hours  Patient remains intubated and sedated.  Pulmonology is following and managing.  From a COVID-19 standpoint he remains on steroids and remdesivir.  He was given convalescent plasma on 10/26.  No Actemra was given.  He was given Lasix as well yesterday.  Additional doses of furosemide to be given today.  Patient's inflammatory markers have improved.  CRP is down to 9.7.  D-dimer has improved to 3.11 from a peak of 5.71.  Ferritin is normal.  Levophed being weaned down.  Fever Most likely due to COVID-19.  Procalcitonin was 0.21.  At this time do not suspect any superimposed bacterial component.  Septic shock Started on pressors per CCM.  Patient is on Levophed which is being weaned down.  Critical care medicine is following.  Diabetes mellitus type 2, uncontrolled with hyperglycemia HbA1c 7.5.  Elevated CBGs most likely due  to steroids.  Continue SSI.  Patient on Lantus 24 units daily.  May need to go up on the dose of Lantus.  Acute renal failure  Rise in BUN and creatinine noted this morning.  Most likely due to diuretics.  Monitor urine output.  Recheck labs tomorrow.  Normocytic anemia No evidence of overt bleeding.  Continue to monitor hemoglobin.  Obesity Estimated body mass index is 33.58 kg/m as calculated from the following:   Height as of this encounter:  (1.803 m).   Weight as of  this encounter: 109.2 kg.  FEN Pressors being weaned down.  Monitor electrolytes.  He is getting tube feedings.  DVT Prophylaxis: On Lovenox adjusted for weight PUD Prophylaxis: Protonix once daily Code Status: Full code Family Communication: We will discuss with family Disposition Plan: Remain in ICU for now   Medications:  Scheduled:  chlorhexidine  15 mL Mouth/Throat BID   Chlorhexidine Gluconate Cloth  6 each Topical Daily   clonazePAM  1 mg Per Tube BID   dexamethasone (DECADRON) injection  6 mg Intravenous Q24H   enoxaparin (LOVENOX) injection  55 mg Subcutaneous Q12H   feeding supplement (PRO-STAT SUGAR FREE 64)  60 mL Per Tube TID   furosemide  40 mg Intravenous Q8H   insulin aspart  0-20 Units Subcutaneous Q4H   insulin glargine  24 Units Subcutaneous Daily   mouth rinse  15 mL Mouth Rinse 10 times per day   metolazone  10 mg Oral Once   oxyCODONE  5 mg Oral Q6H   pantoprazole sodium  40 mg Per Tube Daily   potassium chloride  40 mEq Per Tube Once   senna  1 tablet Per Tube BID   vitamin C  500 mg Per Tube Daily   zinc sulfate  220 mg Per Tube Daily   Continuous:  dexmedetomidine (PRECEDEX) IV infusion 0.4 mcg/kg/hr (03/27/19 1140)   feeding supplement (VITAL AF 1.2 CAL) 1,000 mL (03/26/19 1609)   norepinephrine (LEVOPHED) Adult infusion Stopped (03/26/19 1006)   propofol (DIPRIVAN) infusion     remdesivir 100 mg in NS 250 mL 500 mL/hr at 03/27/19 1000   ZOX:WRUEAVWUJWJXB, bisacodyl, chlorpheniramine-HYDROcodone, guaiFENesin-dextromethorphan, ondansetron **OR** ondansetron (ZOFRAN) IV, sennosides   Objective:  Vital Signs  Vitals:   03/27/19 0910 03/27/19 1009 03/27/19 1100 03/27/19 1142  BP: 121/74   131/76  Pulse: 93     Resp: (!) 35     Temp:    (!) 101.5 F (38.6 C)  TempSrc:    Axillary  SpO2: 99% 94% 90% 92%  Weight:      Height:        Intake/Output Summary (Last 24 hours) at 03/27/2019 1204 Last data filed at  03/27/2019 1011 Gross per 24 hour  Intake 1468.41 ml  Output 4775 ml  Net -3306.59 ml   Filed Weights   03/26/2019 0945 03/26/19 0500 03/27/19 0500  Weight: 111.4 kg 114.1 kg 109.2 kg    General appearance: Intubated and sedated Resp: Coarse breath sounds bilaterally.  Crackles bilateral bases.  No wheezing or rhonchi. Cardio: S1-S2 is normal regular.  No S3-S4.  No rubs murmurs or bruit GI: Abdomen is soft.  Nontender nondistended.  Bowel sounds are present normal.  No masses organomegaly Extremities: Minimal edema bilateral lower extremities Neurologic: No obvious focal neurological deficits.  He is intubated and sedated and was on paralytic agents.   Lab Results:  Data Reviewed: I have personally reviewed following labs and imaging studies  CBC: Recent Labs  Lab 04/06/2019 1300  03/26/19 0529 03/26/19 1319 03/27/19 0445 03/27/19 0523 03/27/19 1141  WBC 25.2*  --  16.1*  --  11.8*  --   --   NEUTROABS 21.5*  --  13.0*  --  8.6*  --   --   HGB 11.5*   < > 9.8* 9.2* 10.0* 9.9* 10.2*  HCT 37.1*   < > 31.5* 27.0* 32.1* 29.0* 30.0*  MCV 91.8  --  90.3  --  89.9  --   --   PLT 625*  --  544*  --  476*  --   --    < > = values in this interval not displayed.    Basic Metabolic Panel: Recent Labs  Lab April 06, 2019 1300  03/26/19 0529 03/26/19 1319 03/27/19 0445 03/27/19 0523 03/27/19 1141  NA 136   < > 141 140 140 138 138  K 5.4*   < > 4.4 4.4 3.8 3.8 4.2  CL 102  --  102  --  94*  --   --   CO2 25  --  30  --  33*  --   --   GLUCOSE 294*  --  204*  --  263*  --   --   BUN 14  --  28*  --  45*  --   --   CREATININE 0.83  --  1.20  --  1.70*  --   --   CALCIUM 8.1*  --  7.9*  --  8.7*  --   --   MG  --   --  1.9  --  1.8  --   --   PHOS  --   --   --   --  3.0  --   --    < > = values in this interval not displayed.    GFR: Estimated Creatinine Clearance: 58.8 mL/min (A) (by C-G formula based on SCr of 1.7 mg/dL (H)).  Liver Function Tests: Recent Labs  Lab  Apr 06, 2019 1300 03/26/19 0529 03/27/19 0445  AST 28 27 39  ALT ALKPHOS 106 90 82  BILITOT 0.4 0.2* 0.4  PROT 7.2 6.4* 6.8  ALBUMIN 2.7* 2.3* 2.4*    HbA1C: Recent Labs    04/06/19 1300  HGBA1C 7.5*    CBG: Recent Labs  Lab 03/26/19 1602 03/26/19 2043 03/27/19 0024 03/27/19 0356 03/27/19 0854  GLUCAP 286* 293* 272* 287* 242*     Anemia Panel: Recent Labs    03/26/19 0529 03/27/19 0445  FERRITIN 267 286    Recent Results (from the past 240 hour(s))  MRSA PCR Screening     Status: None   Collection Time: 04-06-19  8:57 PM   Specimen: Nasal Mucosa; Nasopharyngeal  Result Value Ref Range Status   MRSA by PCR NEGATIVE NEGATIVE Final    Comment:        The GeneXpert MRSA Assay (FDA approved for NASAL specimens only), is one component of a comprehensive MRSA colonization surveillance program. It is not intended to diagnose MRSA infection nor to guide or monitor treatment for MRSA infections. Performed at [facility], [address], [place], [place] [address]       Radiology Studies: Dg Chest 1 View  Result Date: 03/26/2019 CLINICAL DATA:  Intubation.  NG tube placement. EXAM: CHEST  1 VIEW COMPARISON:  Prior chest x-ray same day. FINDINGS: Endotracheal tube and left subclavian line stable position. NG tube noted with tip below  left hemidiaphragm. Heart size stable. Diffuse bilateral pulmonary infiltrates/edema again noted. No pneumothorax. IMPRESSION: 1. Endotracheal tube and left subclavian line stable position. Interim placement of NG tube with noted with tip below left hemidiaphragm. 2. Diffuse bilateral pulmonary infiltrates/edema again noted. No interim change. Electronically Signed   By: Marcello Moores  Register   On: 03/26/2019 12:55   Dg Abd 1 View  Result Date: 03/26/2019 CLINICAL DATA:  NG tube advancement. EXAM: ABDOMEN - 1 VIEW COMPARISON:  Chest x-ray same day.  KUB 03/19/2019. FINDINGS: NG tube noted with tip coiled  stomach. No bowel distention. Bibasilar pulmonary infiltrates. Surgical clips right upper quadrant. IMPRESSION: NG tube noted with tip coiled in stomach. Electronically Signed   By: Marcello Moores  Register   On: 03/26/2019 12:57   Dg Abd 1 View  Result Date: 03/30/2019 CLINICAL DATA:  Central line and NG tube placement EXAM: ABDOMEN - 1 VIEW COMPARISON:  Abdominal radiograph 03/14/2019 at 12:24 p.m. FINDINGS: The nasogastric tube side port projects over the distal esophagus, similar to prior. Gastric distention. Extensive bilateral pulmonary airspace disease. The bowel gas pattern cannot be adequately assessed given the limited field of view. IMPRESSION: The nasogastric tube side port projects over the distal esophagus, similar to prior. Recommend advancement of approximately 15 cm into the stomach. Electronically Signed   By: Audie Pinto M.D.   On: 03/12/2019 13:25   Dg Abd 1 View  Result Date: 03/08/2019 CLINICAL DATA:  Central line and NG tube placement EXAM: ABDOMEN - 1 VIEW COMPARISON:  None. FINDINGS: The nasogastric tube side port projects over the distal esophagus. Gastric distention. The bowel gas pattern cannot be adequately assessed given the limited field of view. No evidence for free air. IMPRESSION: 1. The nasogastric tube side-port projects over the distal esophagus. Recommend advancement of approximately 15 cm into the stomach. 2. Gastric distention. Electronically Signed   By: Audie Pinto M.D.   On: 03/12/2019 13:24   Dg Chest Port 1 View  Result Date: 03/27/2019 CLINICAL DATA:  Endotracheal tube placement. EXAM: PORTABLE CHEST 1 VIEW COMPARISON:  March 26, 2019. FINDINGS: Stable cardiomediastinal silhouette. Endotracheal and nasogastric tubes are unchanged in position. Left subclavian catheter is unchanged. No pneumothorax is noted. Stable bilateral lung opacities are noted concerning for multifocal pneumonia. Bony thorax is unremarkable. IMPRESSION: Stable support apparatus.  Stable bilateral lung opacities are noted concerning for multifocal pneumonia. Electronically Signed   By: Marijo Conception M.D.   On: 03/27/2019 07:51   Dg Chest Port 1 View  Result Date: 03/26/2019 CLINICAL DATA:  Pneumonia due to COVID-19. EXAM: PORTABLE CHEST 1 VIEW COMPARISON:  March 25, 2019. FINDINGS: Stable cardiomegaly. Endotracheal tube is in good position. Nasogastric tube has been removed. Left subclavian catheter is unchanged. No pneumothorax is noted. No significant pleural effusion is noted. Stable bilateral lung opacities are noted most consistent with multifocal pneumonia. Bony thorax is unremarkable. IMPRESSION: Endotracheal tube in good position. Stable bilateral lung opacities consistent with multifocal pneumonia. Electronically Signed   By: Marijo Conception M.D.   On: 03/26/2019 07:52   Dg Chest Port 1 View  Result Date: 03/12/2019 CLINICAL DATA:  Central line and NG tube placement EXAM: PORTABLE CHEST 1 VIEW COMPARISON:  Chest radiograph 03/28/2019 FINDINGS: The endotracheal tube tip terminates between the thoracic inlet and carina. The nasogastric tube tip is poorly visualized beyond the diaphragm, see separate abdominal radiograph. Interval placement of a left central venous catheter with tip projecting over the distal SVC. No pneumothorax. No large pleural effusion.  Bilateral diffuse airspace opacities. IMPRESSION: 1. Interval placement of a left central venous catheter with tip projecting over the distal SVC. No pneumothorax. 2. The nasogastric tube tip is poorly visualized beyond the diaphragm. See separately dictated abdominal radiograph. Electronically Signed   By: Emmaline Kluver M.D.   On: 03/03/2019 13:22       LOS: 2 days   Ellorie Kindall Rito Ehrlich  [facility] Pager on [email or web address]  03/27/2019, 12:04 PM

## 2019-03-27 NOTE — Progress Notes (Signed)
Inpatient Diabetes Program Recommendations  AACE/ADA: New Consensus Statement on Inpatient Glycemic Control   Target Ranges:  Prepandial:   less than 140 mg/dL      Peak postprandial:   less than 180 mg/dL (1-2 hours)      Critically ill patients:  140 - 180 mg/dL   Results for Angel Stuart, Angel Stuart (MRN [id number]) as of 03/27/2019 13:22  Ref. Range 03/26/2019 08:06 03/26/2019 12:02 03/26/2019 16:02 03/26/2019 20:43 03/27/2019 00:24 03/27/2019 03:56 03/27/2019 08:54  Glucose-Capillary Latest Ref Range: 70 - 99 mg/dL 201 (H) 244 (H) 286 (H) 293 (H) 272 (H) 287 (H) 242 (H)   Review of Glycemic Control  Diabetes history: DM2 Outpatient Diabetes medications: Lantus 60 units daily, Tradjenta 5 mg daily, Metformin 1000 mg BID Current orders for Inpatient glycemic control: Lantus 24 units daily, Lantus 10 units QHS, Novolog 0-20 units Q4H; Decadron 6 mg Q24H, Vital @ 40 ml/hr  Inpatient Diabetes Program Recommendations:   Insulin-Basal: Noted Lantus 10 units QHS added today. May want to consider increasing Lanuts further to Lantus 24 units BID (to start at bedtime today).  Insulin-Tube Feeding Coverage: Please consider ordering Novolog 4 units Q4H for tube feeding coverage. If tube feeding is stopped or held then Novolog tube feeding coverage should also be stopped or held.  Thanks, Barnie Alderman, RN, MSN, CDE Diabetes Coordinator Inpatient Diabetes Program [phone] (Team Pager from 8am to 5pm)

## 2019-03-28 ENCOUNTER — Inpatient Hospital Stay (HOSPITAL_COMMUNITY): Payer: PRIVATE HEALTH INSURANCE

## 2019-03-28 DIAGNOSIS — U071 COVID-19: Secondary | ICD-10-CM | POA: Diagnosis not present

## 2019-03-28 DIAGNOSIS — J810 Acute pulmonary edema: Secondary | ICD-10-CM | POA: Diagnosis not present

## 2019-03-28 DIAGNOSIS — J9601 Acute respiratory failure with hypoxia: Secondary | ICD-10-CM | POA: Diagnosis not present

## 2019-03-28 DIAGNOSIS — G9340 Encephalopathy, unspecified: Secondary | ICD-10-CM | POA: Diagnosis not present

## 2019-03-28 LAB — GLUCOSE, CAPILLARY
Glucose-Capillary: 197 mg/dL — ABNORMAL HIGH (ref 70–99)
Glucose-Capillary: 204 mg/dL — ABNORMAL HIGH (ref 70–99)
Glucose-Capillary: 281 mg/dL — ABNORMAL HIGH (ref 70–99)
Glucose-Capillary: 297 mg/dL — ABNORMAL HIGH (ref 70–99)
Glucose-Capillary: 322 mg/dL — ABNORMAL HIGH (ref 70–99)
Glucose-Capillary: 331 mg/dL — ABNORMAL HIGH (ref 70–99)
Glucose-Capillary: 360 mg/dL — ABNORMAL HIGH (ref 70–99)

## 2019-03-28 LAB — CBC WITH DIFFERENTIAL/PLATELET
Abs Immature Granulocytes: 0.08 10*3/uL — ABNORMAL HIGH (ref 0.00–0.07)
Basophils Absolute: 0 10*3/uL (ref 0.0–0.1)
Basophils Relative: 0 %
Eosinophils Absolute: 0 10*3/uL (ref 0.0–0.5)
Eosinophils Relative: 0 %
HCT: 33.2 % — ABNORMAL LOW (ref 39.0–52.0)
Hemoglobin: 10.3 g/dL — ABNORMAL LOW (ref 13.0–17.0)
Immature Granulocytes: 1 %
Lymphocytes Relative: 19 %
Lymphs Abs: 2.1 10*3/uL (ref 0.7–4.0)
MCH: 27.9 pg (ref 26.0–34.0)
MCHC: 31 g/dL (ref 30.0–36.0)
MCV: 90 fL (ref 80.0–100.0)
Monocytes Absolute: 1.7 10*3/uL — ABNORMAL HIGH (ref 0.1–1.0)
Monocytes Relative: 15 %
Neutro Abs: 7.3 10*3/uL (ref 1.7–7.7)
Neutrophils Relative %: 65 %
Platelets: 452 10*3/uL — ABNORMAL HIGH (ref 150–400)
RBC: 3.69 MIL/uL — ABNORMAL LOW (ref 4.22–5.81)
RDW: 14 % (ref 11.5–15.5)
WBC: 11.3 10*3/uL — ABNORMAL HIGH (ref 4.0–10.5)
nRBC: 0 % (ref 0.0–0.2)

## 2019-03-28 LAB — POCT I-STAT 7, (LYTES, BLD GAS, ICA,H+H)
Acid-Base Excess: 13 mmol/L — ABNORMAL HIGH (ref 0.0–2.0)
Bicarbonate: 38.4 mmol/L — ABNORMAL HIGH (ref 20.0–28.0)
Calcium, Ion: 1.17 mmol/L (ref 1.15–1.40)
HCT: 31 % — ABNORMAL LOW (ref 39.0–52.0)
Hemoglobin: 10.5 g/dL — ABNORMAL LOW (ref 13.0–17.0)
O2 Saturation: 91 %
Patient temperature: 99.5
Potassium: 4.1 mmol/L (ref 3.5–5.1)
Sodium: 138 mmol/L (ref 135–145)
TCO2: 40 mmol/L — ABNORMAL HIGH (ref 22–32)
pCO2 arterial: 54.9 mmHg — ABNORMAL HIGH (ref 32.0–48.0)
pH, Arterial: 7.455 — ABNORMAL HIGH (ref 7.350–7.450)
pO2, Arterial: 62 mmHg — ABNORMAL LOW (ref 83.0–108.0)

## 2019-03-28 LAB — COMPREHENSIVE METABOLIC PANEL
ALT: 24 U/L (ref 0–44)
AST: 34 U/L (ref 15–41)
Albumin: 2.5 g/dL — ABNORMAL LOW (ref 3.5–5.0)
Alkaline Phosphatase: 73 U/L (ref 38–126)
Anion gap: 10 (ref 5–15)
BUN: 55 mg/dL — ABNORMAL HIGH (ref 6–20)
CO2: 35 mmol/L — ABNORMAL HIGH (ref 22–32)
Calcium: 8.9 mg/dL (ref 8.9–10.3)
Chloride: 92 mmol/L — ABNORMAL LOW (ref 98–111)
Creatinine, Ser: 1.7 mg/dL — ABNORMAL HIGH (ref 0.61–1.24)
GFR calc Af Amer: 50 mL/min — ABNORMAL LOW (ref >60)
GFR calc non Af Amer: 43 mL/min — ABNORMAL LOW (ref >60)
Glucose, Bld: 221 mg/dL — ABNORMAL HIGH (ref 70–99)
Potassium: 4 mmol/L (ref 3.5–5.1)
Sodium: 137 mmol/L (ref 135–145)
Total Bilirubin: 0.4 mg/dL (ref 0.3–1.2)
Total Protein: 7.2 g/dL (ref 6.5–8.1)

## 2019-03-28 LAB — D-DIMER, QUANTITATIVE: D-Dimer, Quant: 3.1 ug/mL-FEU — ABNORMAL HIGH (ref 0.00–0.50)

## 2019-03-28 LAB — PHOSPHORUS: Phosphorus: 4.1 mg/dL (ref 2.5–4.6)

## 2019-03-28 LAB — MAGNESIUM: Magnesium: 1.7 mg/dL (ref 1.7–2.4)

## 2019-03-28 LAB — FERRITIN: Ferritin: 289 ng/mL (ref 24–336)

## 2019-03-28 LAB — C-REACTIVE PROTEIN: CRP: 5.2 mg/dL — ABNORMAL HIGH (ref <1.0)

## 2019-03-28 MED ORDER — INSULIN GLARGINE 100 UNIT/ML ~~LOC~~ SOLN
20.0000 [IU] | Freq: Every day | SUBCUTANEOUS | Status: DC
  Administered 2019-03-28: 20 [IU] via SUBCUTANEOUS
  Filled 2019-03-28: qty 0.2

## 2019-03-28 MED ORDER — FENTANYL CITRATE (PF) 2500 MCG/50ML IJ SOLN
50.0000 ug/h | INTRAMUSCULAR | Status: DC
  Administered 2019-03-28 (×2): 200 ug/h via INTRAVENOUS
  Administered 2019-03-29: 100 ug/h via INTRAVENOUS
  Administered 2019-03-30: 200 ug/h via INTRAVENOUS
  Administered 2019-03-31: 150 ug/h via INTRAVENOUS
  Administered 2019-03-31 – 2019-04-02 (×4): 200 ug/h via INTRAVENOUS
  Administered 2019-04-03 (×2): 100 ug/h via INTRAVENOUS
  Filled 2019-03-28 (×11): qty 50

## 2019-03-28 MED ORDER — VITAL AF 1.2 CAL PO LIQD
1000.0000 mL | ORAL | Status: DC
  Administered 2019-03-28 – 2019-04-03 (×5): 1000 mL

## 2019-03-28 MED ORDER — METOLAZONE 5 MG PO TABS
5.0000 mg | ORAL_TABLET | Freq: Every day | ORAL | Status: AC
  Administered 2019-03-28: 5 mg via ORAL
  Filled 2019-03-28: qty 1

## 2019-03-28 MED ORDER — PRO-STAT SUGAR FREE PO LIQD
30.0000 mL | Freq: Two times a day (BID) | ORAL | Status: DC
  Administered 2019-03-28 – 2019-04-03 (×12): 30 mL
  Filled 2019-03-28 (×12): qty 30

## 2019-03-28 MED ORDER — FENTANYL CITRATE (PF) 2500 MCG/50ML IJ SOLN: 50.0000 ug/h | INTRAMUSCULAR | Status: DC

## 2019-03-28 MED ORDER — QUETIAPINE FUMARATE 25 MG PO TABS
25.0000 mg | ORAL_TABLET | Freq: Two times a day (BID) | ORAL | Status: DC
  Administered 2019-03-28 – 2019-04-04 (×15): 25 mg via ORAL
  Filled 2019-03-28 (×15): qty 1

## 2019-03-28 MED ORDER — FENTANYL 2500MCG IN NS 250ML (10MCG/ML) PREMIX INFUSION: 50.0000 ug/h | INTRAVENOUS | Status: DC

## 2019-03-28 MED ORDER — FUROSEMIDE 10 MG/ML IJ SOLN
40.0000 mg | Freq: Three times a day (TID) | INTRAMUSCULAR | Status: AC
  Administered 2019-03-28 (×2): 40 mg via INTRAVENOUS
  Filled 2019-03-28 (×2): qty 4

## 2019-03-28 NOTE — Progress Notes (Signed)
Inpatient Diabetes Program Recommendations  AACE/ADA: New Consensus Statement on Inpatient Glycemic Control   Target Ranges:  Prepandial:   less than 140 mg/dL      Peak postprandial:   less than 180 mg/dL (1-2 hours)      Critically ill patients:  140 - 180 mg/dL   Results for AMBER, GUTHRIDGE (MRN [id number]) as of 03/28/2019 09:30  Ref. Range 03/27/2019 08:54 03/27/2019 12:35 03/27/2019 17:00 03/27/2019 20:21 03/28/2019 00:15 03/28/2019 03:59 03/28/2019 07:49  Glucose-Capillary Latest Ref Range: 70 - 99 mg/dL 242 (H)  Novolog 7 units  Lantus 24 units 313 (H)  Novolog 15 units 329 (H)  Novolog 15 units 362 (H)  Novolog 20 units  Lantus 10 units 281 (H)  Novolog 11 units 204 (H)  Novolog 11 units 197 (H)  Novolog 4 units  Lantus 24 units   Review of Glycemic Control  Diabetes history:DM2 Outpatient Diabetes medications:Lantus 60 units daily, Tradjenta 5 mg daily, Metformin 1000 mg BID Current orders for Inpatient glycemic control: Lantus 24 units daily, Lantus 10 units QHS,Novolog 0-20 units Q4H; Decadron 6 mg Q24H, Vital @ 40 ml/hr  Inpatient Diabetes Program Recommendations:  Insulin-Basal: If steroids are continued, please consider increasing Lanuts to 30 units BID (to start at bedtime today).  Insulin-Tube Feeding Coverage:Please consider ordering Novolog 5 units Q4H for tube feeding coverage. If tube feeding is stopped or held then Novolog tube feeding coverage should also be stopped or held.  Thanks, Barnie Alderman, RN, MSN, CDE Diabetes Coordinator Inpatient Diabetes Program [phone] (Team Pager from 8am to 5pm)

## 2019-03-28 NOTE — Progress Notes (Signed)
Called and updated pt's spouse on pt's conditon.  Continue to fight fever today.  Other questions answered to best of RN ability.

## 2019-03-28 NOTE — Progress Notes (Addendum)
Phlebotomy at bedside to draw blood cx.

## 2019-03-28 NOTE — Progress Notes (Signed)
NAME:  Angel Stuart, MRN:  [id number], DOB:  25-Sep-1960, LOS: 3 ADMISSION DATE:  03/04/2019, CONSULTATION DATE:  10/26 REFERRING MD:  Thereasa Solo, CHIEF COMPLAINT:  Dyspnea   Brief History   58 y/o male admitted on 10/26 in setting of ARDS from [place] 19 pneumonia, required intubation in the [facility] hospital ER.  History of present illness   This is a 58 y/o male with HTN, HLD, DM2 who was admitted to [facility] ICU on 10/26 in the setting of ARDS from COVID 19 pneumonia.  He went to the [facility] ER where he was noted to have an O2 saturation of 53% on RA.  He was briefly treated with BIPAP, then intubated.  He was intubated on arrival here so I could not obtain a history from him.  History was obtained by reviewing records from [facility].  Past Medical History  HTN HLD DM2  Significant Hospital Events   10/26 admission to ICU  Consults:  PCCM  Procedures:  10/26 ETT >  10/26 L subclavian CVL >   Significant Diagnostic Tests:    Micro Data:  10/26 SARS COV 2 [place]) > positive  Antimicrobials:  10/26 decadron>  10/26 remdesivir >   Interim history/subjective:  Agitation overnight, required propofol  Objective   Blood pressure 113/78, pulse (!) 108, temperature (!) 101 F (38.3 C), temperature source Axillary, resp. rate (!) 31, height 5\' 11"  (1.803 m), weight 109 kg, SpO2 93 %.    Vent Mode: PRVC FiO2 (%):  [40 %-60 %] 40 % Set Rate:  [24 bmp-35 bmp] 24 bmp Vt Set:  [600 mL] 600 mL PEEP:  [8 cmH20-10 cmH20] 8 cmH20 Plateau Pressure:  [21 cmH20-26 cmH20] 24 cmH20   Intake/Output Summary (Last 24 hours) at 03/28/2019 0849 Last data filed at 03/28/2019 0700 Gross per 24 hour  Intake 2493.55 ml  Output 5710 ml  Net -3216.45 ml   Filed Weights   03/26/19 0500 03/27/19 0500 03/28/19 0500  Weight: 114.1 kg 109.2 kg 109 kg   Examination:  General:  Acutely ill appearing male, NAD HENT: [place]/AT, PERRL, EOM-I and MMM, ETT in place PULM: Diffuse rales CV:  RRR, Nl S1/S2 and -M/R/G GI: Soft, NT, ND and +BS MSK: 1+ edema and -tenderness Neuro: Sedated and intubated Skin: Intact  I reviewed CXR myself, ETT high but below cords, infiltrate noted  Discussed with TRH-MD  Resolved Hospital Problem list   N/A  Assessment & Plan:  ARDS due to COVID 19 pneumonia: severe hypoxemia, worsening hypercarbia Continue mechanical ventilation per ARDS protocol Target TVol 6-8cc/kgIBW Target Plateau Pressure < 30cm H20 Target driving pressure less than 15 cm of water Target PaO2 55-65: titrate PEEP/FiO2 per protocol As long as PaO2 to FiO2 ratio is less than 1:150 position in prone position for 16 hours a day Follow CVP with target of 4 Cr holding, reorder dose of lasix from yesterday Ventilator associated pneumonia prevention protocol Goal is 40 and 5 today If tolerated then begin PS trials D/Ced proning D/Ced versed drip and change to PRN pushes  Need for sedation for mechanical ventilation: high sedation needs Continue oral sedation: oxycodone, clonazepam D/C paralytic orderset, no longer needing proning Change RASS score to -2 as target Will reattempt precedex inplace of propofol to assist with weaning  PCCM will continue to follow  Best practice:  Diet: start tube feeding Pain/Anxiety/Delirium protocol (if indicated): as above, continuous paralytic protocol VAP protocol (if indicated): yes DVT prophylaxis: lovenox per protocol, discuss COVID PACT with the  patient's family GI prophylaxis: pantoprazole Glucose control: SSI Mobility: bed rest Code Status: full Family [facility] Disposition: remain in ICU  Labs   CBC: Recent Labs  Lab 10-07-18 1300  03/26/19 0529  03/27/19 0445 03/27/19 0523 03/27/19 1141 03/28/19 0344 03/28/19 0450  WBC 25.2*  --  16.1*  --  11.8*  --   --   --  11.3*  NEUTROABS 21.5*  --  13.0*  --  8.6*  --   --   --  7.3  HGB 11.5*   < > 9.8*   < > 10.0* 9.9* 10.2* 10.5* 10.3*  HCT 37.1*   <  > 31.5*   < > 32.1* 29.0* 30.0* 31.0* 33.2*  MCV 91.8  --  90.3  --  89.9  --   --   --  90.0  PLT 625*  --  544*  --  476*  --   --   --  452*   < > = values in this interval not displayed.    Basic Metabolic Panel: Recent Labs  Lab 10-07-18 1300  03/26/19 0529  03/27/19 0445 03/27/19 0523 03/27/19 1141 03/28/19 0344 03/28/19 0450  NA 136   < > 141   < > 140 138 138 138 137  K 5.4*   < > 4.4   < > 3.8 3.8 4.2 4.1 4.0  CL 102  --  102  --  94*  --   --   --  92*  CO2 25  --  30  --  33*  --   --   --  35*  GLUCOSE 294*  --  204*  --  263*  --   --   --  221*  BUN 14  --  28*  --  45*  --   --   --  55*  CREATININE 0.83  --  1.20  --  1.70*  --   --   --  1.70*  CALCIUM 8.1*  --  7.9*  --  8.7*  --   --   --  8.9  MG  --   --  1.9  --  1.8  --   --   --  1.7  PHOS  --   --   --   --  3.0  --   --   --  4.1   < > = values in this interval not displayed.   GFR: Estimated Creatinine Clearance: 58.8 mL/min (A) (by C-G formula based on SCr of 1.7 mg/dL (H)). Recent Labs  Lab 10-07-18 1300 03/26/19 0529 03/27/19 0445 03/28/19 0450  PROCALCITON 0.27 0.21 0.22  --   WBC 25.2* 16.1* 11.8* 11.3*    Liver Function Tests: Recent Labs  Lab 10-07-18 1300 03/26/19 0529 03/27/19 0445 03/28/19 0450  AST 28 27 39 34  ALT 18 17 21 24   ALKPHOS 106 90 82 73  BILITOT 0.4 0.2* 0.4 0.4  PROT 7.2 6.4* 6.8 7.2  ALBUMIN 2.7* 2.3* 2.4* 2.5*   No results for input(s): LIPASE, AMYLASE in the last 168 hours. No results for input(s): AMMONIA in the last 168 hours.  ABG    Component Value Date/Time   PHART 7.455 (H) 03/28/2019 0344   PCO2ART 54.9 (H) 03/28/2019 0344   PO2ART 62.0 (L) 03/28/2019 0344   HCO3 38.4 (H) 03/28/2019 0344   TCO2 40 (H) 03/28/2019 0344   ACIDBASEDEF 3.0 (H) 10-07-2018 1442   O2SAT 91.0 03/28/2019 0344  Coagulation Profile: No results for input(s): INR, PROTIME in the last 168 hours.  Cardiac Enzymes: No results for input(s): CKTOTAL, CKMB, CKMBINDEX,  TROPONINI in the last 168 hours.  HbA1C: Hgb A1c MFr Bld  Date/Time Value Ref Range Status  03/28/2019 01:00 PM 7.5 (H) 4.8 - 5.6 % Final    Comment:    (NOTE) Pre diabetes:          5.7%-6.4% Diabetes:              >6.4% Glycemic control for   <7.0% adults with diabetes     CBG: Recent Labs  Lab 03/27/19 1700 03/27/19 2021 03/28/19 0015 03/28/19 0359 03/28/19 0749  GLUCAP 329* 362* 281* 204* 197*   The patient is critically ill with multiple organ systems failure and requires high complexity decision making for assessment and support, frequent evaluation and titration of therapies, application of advanced monitoring technologies and extensive interpretation of multiple databases.   Critical Care Time devoted to patient care services described in this note is  32  Minutes. This time reflects time of care of this signee Dr Koren Bound. This critical care time does not reflect procedure time, or teaching time or supervisory time of PA/NP/Med student/Med Resident etc but could involve care discussion time.  Alyson Reedy, M.D. [facility] Pulmonary/Critical Care Medicine.

## 2019-03-28 NOTE — Progress Notes (Signed)
Spoke to patients wife and updated her on patient condition.

## 2019-03-28 NOTE — Progress Notes (Signed)
Spoke to [place], patient's wife and gave updates on patient and answered all questions regarding patient care.  Melissa will fax FMLA papers to [facility] to be filled out, signed and sent back to her. Spoke to charge nurse regarding  This and she will call Melissa with the fax number to [facility].

## 2019-03-28 NOTE — Progress Notes (Signed)
Per CCM MD pt ETT instilled with 10cc normal saline. Pt then suctioned for a moderate amount of pale yellow secretions. Pt tolerated well, RT will continue to monitor.

## 2019-03-28 NOTE — Progress Notes (Signed)
Wasted 60 of Versed in the [place] with second nurse, Estil Daft

## 2019-03-28 NOTE — Progress Notes (Signed)
Nutrition Follow-up RD working remotely.  DOCUMENTATION CODES:   Obesity unspecified  INTERVENTION:    Vital AF 1.2 at 70 ml/h (1680 ml per day)   Pro-stat 30 ml BID   Provides 2216 kcal (2736 kcal with Propofol), 156 gm protein, 1362 ml free water daily  NUTRITION DIAGNOSIS:   Increased nutrient needs related to acute illness as evidenced by estimated needs.  Ongoing   GOAL:   Provide needs based on ASPEN/SCCM guidelines   Met with TF  MONITOR:   Vent status, Labs, I & O's, TF tolerance  ASSESSMENT:   58 yo male admitted with progressive SOB after testing positive for COVID-19 on 10/9 (symptoms started 10/5). PMH includes DM, HLD.   Cortrak feeding tube placed 10/28, tip is postpyloric. Currently receiving Vital AF 1.2 at 40 ml/h with Pro-stat 60 ml TID.  Patient remains intubated on ventilator support, now requiring propofol for sedation. No longer being proned. MV: 17.4 L/min Temp (24hrs), Avg:101.4 F (38.6 C), Min:100.4 F (38 C), Max:102.8 F (39.3 C)  Propofol at 19.7 ml/h providing 520 kcal from lipid.   Labs reviewed.  CBG's: [phone]  Medications reviewed and include decadron, lasix, novolog, lantus, senokot, vitamin C, zinc sulfate, propofol.  I/O -3.2 L since admission  NUTRITION - FOCUSED PHYSICAL EXAM:  deferred  Diet Order:   Diet Order            Diet NPO time specified  Diet effective now              EDUCATION NEEDS:   Not appropriate for education at this time  Skin:  Skin Assessment: Reviewed RN Assessment  Last BM:  no BM documented  Height:   Ht Readings from Last 1 Encounters:  03/07/2019 5' 11" (1.803 m)    Weight:   Wt Readings from Last 1 Encounters:  03/28/19 109 kg    Ideal Body Weight:  78.2 kg  BMI:  Body mass index is 33.52 kg/m.  Estimated Nutritional Needs:   Kcal:  2700  Protein:  >/= 156 gm  Fluid:  >/= 1.8 L    Molli Barrows, RD, LDN, [place] Pager [phone] After Hours  Pager [phone]

## 2019-03-28 NOTE — Progress Notes (Signed)
PROGRESS NOTE  Angel Stuart OEU:[id number] DOB: April 09, 1961 DOA: 20-Apr-2019  PCP: Doreatha Lew, MD  Brief History/Interval Summary: 58 year old with a history of hyperlipidemia and DM 2 who began to develop upper respiratory symptoms on 10/5, and ultimately tested positive for Covid on 10/9. Records suggest he experienced a gradual progression of symptoms with acute worsening of shortness of breath on 10/25, which prompted him to present to the [facility] ED. In the [facility] ED he was noted to have a saturation of 63% on room air. Chest x-ray noted severe bilateral infiltrates. He rapidly decompensated in the emergency room. He failed a trial of BiPAP and had to be intubated. He was subsequently transferred to [facility] to the ICU. At the time of my exam he is intubated and sedated.  Reason for Visit: Acute respiratory failure with hypoxia.  Pneumonia due to COVID-19  Consultants: Pulmonology  Procedures: Intubated at [facility] ED  Antibiotics: Anti-infectives (From admission, onward)   Start     Dose/Rate Route Frequency Ordered Stop   03/26/19 1000  remdesivir 100 mg in sodium chloride 0.9 % 250 mL IVPB     100 mg 500 mL/hr over 30 Minutes Intravenous Every 24 hours 04-20-2019 1031 03/30/19 0959      Subjective/Interval History: Patient is intubated and sedated.   Assessment/Plan:  Acute Hypoxic Resp. Failure/Pneumonia due to COVID-19  Vent Mode: PRVC FiO2 (%):  [40 %-60 %] 40 % Set Rate:  [24 bmp] 24 bmp Vt Set:  [600 mL] 600 mL PEEP:  [8 cmH20-10 cmH20] 8 cmH20 Plateau Pressure:  [21 cmH20-24 cmH20] 24 cmH20     Component Value Date/Time   PHART 7.455 (H) 03/28/2019 0344   PCO2ART 54.9 (H) 03/28/2019 0344   PO2ART 62.0 (L) 03/28/2019 0344   HCO3 38.4 (H) 03/28/2019 0344   TCO2 40 (H) 03/28/2019 0344   ACIDBASEDEF 3.0 (H) Apr 20, 2019 1442   O2SAT 91.0 03/28/2019 0344    COVID-19 Labs  Recent Labs    03/26/19 0529 03/27/19 0445 03/28/19  0450  DDIMER 3.78* 3.11* 3.10*  FERRITIN 267 286 289  CRP 16.6* 9.7* 5.2*    Patient had positive COVID-19 result on 10/9.  This was either at [facility] or one of the clinics in that area.  Fever: Continues to be afebrile.  Temperature of 101 F this morning. Oxygen requirements: Mechanical ventilation.  40% FiO2.  Saturating in the 90s.  Antibacterials: None Remdesivir: Day 4 Steroids: Dexamethasone 6 mg daily Diuretics: Being given diuretics including Lasix and metolazone on a daily basis depending on volume status. Actemra: Not given Convalescent plasma: Transfused on 10/26 Vitamin C and Zinc: Continue DVT Prophylaxis:  Lovenox 55 mg every 12 hours   Patient remains intubated and sedated.  Pulmonology is following and managing.  From a COVID-19 standpoint patient remains on remdesivir and steroids.  He was given convalescent plasma.  Actemra was not given due to elevated procalcitonin.  Inflammatory markers have improved.  CRP is down to 5.2.  Ferritin is normal.  D-dimer relatively stable at 3.10 today.  Levophed being weaned down.  Fever Continues to be febrile.  Procalcitonin was 0.21.  Mildly elevated WBC most likely due to steroids.  Fever most likely due to COVID-19.  Continue to monitor for now without any further interventions.    Septic shock Started on pressors per CCM.  Patient on Levophed which is being weaned down gradually.  Blood pressures appear to be stable.    Diabetes mellitus type 2, uncontrolled  with hyperglycemia HbA1c 7.5.  Most likely due to steroids.  Patient on 24 units of Lantus during the daytime.  10 units of Lantus added during the nighttime.  Improvement noted.  Continue to monitor.  May need to further adjust the dose of Lantus.  Continue SSI.    Acute renal failure  BUN and creatinine elevated but stable compared to yesterday.  Most likely due to diuretics.  Monitor urine output.    Normocytic anemia No evidence of overt bleeding.  Continue  to monitor hemoglobin.  Obesity Estimated body mass index is 33.52 kg/m as calculated from the following:   Height as of this encounter:  (1.803 m).   Weight as of this encounter: 109 kg.  FEN Pressors being weaned down.  Monitor electrolytes.  He is getting tube feedings.  DVT Prophylaxis: On Lovenox adjusted for weight PUD Prophylaxis: Protonix once daily Code Status: Full code Family Communication: Wife was updated yesterday. Disposition Plan: Remain in ICU for now   Medications:  Scheduled: . chlorhexidine  15 mL Mouth/Throat BID  . Chlorhexidine Gluconate Cloth  6 each Topical Daily  . clonazePAM  1 mg Per Tube BID  . dexamethasone (DECADRON) injection  6 mg Intravenous Q24H  . enoxaparin (LOVENOX) injection  55 mg Subcutaneous Q12H  . feeding supplement (PRO-STAT SUGAR FREE 64)  60 mL Per Tube TID  . furosemide  40 mg Intravenous Q8H  . insulin aspart  0-20 Units Subcutaneous Q4H  . insulin glargine  10 Units Subcutaneous QHS  . insulin glargine  24 Units Subcutaneous Daily  . mouth rinse  15 mL Mouth Rinse 10 times per day  . metolazone  5 mg Oral Daily  . oxyCODONE  5 mg Per Tube Q6H  . pantoprazole sodium  40 mg Per Tube Daily  . QUEtiapine  25 mg Oral BID  . senna  1 tablet Per Tube BID  . vitamin C  500 mg Per Tube Daily  . zinc sulfate  220 mg Per Tube Daily   Continuous: . feeding supplement (VITAL AF 1.2 CAL) 40 mL/hr at 03/28/19 0600  . fentaNYL infusion INTRAVENOUS    . norepinephrine (LEVOPHED) Adult infusion Stopped (03/26/19 1006)  . propofol (DIPRIVAN) infusion 30 mcg/kg/min (03/28/19 1202)  . remdesivir 100 mg in NS 250 mL Stopped (03/28/19 1023)   ZOX:[id number], bisacodyl, chlorpheniramine-HYDROcodone, fentaNYL, guaiFENesin-dextromethorphan, ondansetron **OR** ondansetron (ZOFRAN) IV, sennosides   Objective:  Vital Signs  Vitals:   03/28/19 0800 03/28/19 0900 03/28/19 1000 03/28/19 1100  BP: 124/78 130/77 112/70 116/77  Pulse:  (!) 102 (!) 103 (!) 103 100  Resp: (!) 26 (!) 24 (!) 24 (!) 24  Temp:    (!) 102.8 F (39.3 C)  TempSrc:    Axillary  SpO2: 91% 92% 92% 90%  Weight:      Height:        Intake/Output Summary (Last 24 hours) at 03/28/2019 1224 Last data filed at 03/28/2019 1100 Gross per 24 hour  Intake 2380.24 ml  Output 5010 ml  Net -2629.76 ml   Filed Weights   03/26/19 0500 03/27/19 0500 03/28/19 0500  Weight: 114.1 kg 109.2 kg 109 kg    General appearance: Intubated and sedated Resp: Coarse breath sound bilaterally with crackles at the bases.  No wheezing or rhonchi.   Cardio: S1-S2 is normal regular.  No S3-S4.  No rubs murmurs or bruit GI: Abdomen is soft.  Nontender nondistended.  Bowel sounds are present normal.  No masses organomegaly Extremities:  Minimal edema noted bilateral lower extremities Neurologic: Sedated.  Agitated at times per nursing staff.    Lab Results:  Data Reviewed: I have personally reviewed following labs and imaging studies  CBC: Recent Labs  Lab 03/28/2019 1300  03/26/19 0529  03/27/19 0445 03/27/19 0523 03/27/19 1141 03/28/19 0344 03/28/19 0450  WBC 25.2*  --  16.1*  --  11.8*  --   --   --  11.3*  NEUTROABS 21.5*  --  13.0*  --  8.6*  --   --   --  7.3  HGB 11.5*   < > 9.8*   < > 10.0* 9.9* 10.2* 10.5* 10.3*  HCT 37.1*   < > 31.5*   < > 32.1* 29.0* 30.0* 31.0* 33.2*  MCV 91.8  --  90.3  --  89.9  --   --   --  90.0  PLT 625*  --  544*  --  476*  --   --   --  452*   < > = values in this interval not displayed.    Basic Metabolic Panel: Recent Labs  Lab 03/26/2019 1300  03/26/19 0529  03/27/19 0445 03/27/19 0523 03/27/19 1141 03/28/19 0344 03/28/19 0450  NA 136   < > 141   < > 140 138 138 138 137  K 5.4*   < > 4.4   < > 3.8 3.8 4.2 4.1 4.0  CL 102  --  102  --  94*  --   --   --  92*  CO2 25  --  30  --  33*  --   --   --  35*  GLUCOSE 294*  --  204*  --  263*  --   --   --  221*  BUN 14  --  28*  --  45*  --   --   --  55*  CREATININE  0.83  --  1.20  --  1.70*  --   --   --  1.70*  CALCIUM 8.1*  --  7.9*  --  8.7*  --   --   --  8.9  MG  --   --  1.9  --  1.8  --   --   --  1.7  PHOS  --   --   --   --  3.0  --   --   --  4.1   < > = values in this interval not displayed.    GFR: Estimated Creatinine Clearance: 58.8 mL/min (A) (by C-G formula based on SCr of 1.7 mg/dL (H)).  Liver Function Tests: Recent Labs  Lab 03/18/2019 1300 03/26/19 0529 03/27/19 0445 03/28/19 0450  AST 28 27 39 34  ALT 18 17 21 24   ALKPHOS 106 90 82 73  BILITOT 0.4 0.2* 0.4 0.4  PROT 7.2 6.4* 6.8 7.2  ALBUMIN 2.7* 2.3* 2.4* 2.5*    HbA1C: Recent Labs    03/13/2019 1300  HGBA1C 7.5*    CBG: Recent Labs  Lab 03/27/19 2021 03/28/19 0015 03/28/19 0359 03/28/19 0749 03/28/19 1140  GLUCAP 362* 281* 204* 197* 297*     Anemia Panel: Recent Labs    03/27/19 0445 03/28/19 0450  FERRITIN 286 289    Recent Results (from the past 240 hour(s))  MRSA PCR Screening     Status: None   Collection Time: 03/20/2019  8:57 PM   Specimen: Nasal Mucosa; Nasopharyngeal  Result Value Ref Range Status  MRSA by PCR NEGATIVE NEGATIVE Final    Comment:        The GeneXpert MRSA Assay (FDA approved for NASAL specimens only), is one component of a comprehensive MRSA colonization surveillance program. It is not intended to diagnose MRSA infection nor to guide or monitor treatment for MRSA infections. Performed at [facility], [address] [place], [place] [address]       Radiology Studies: Dg Chest 1 View  Result Date: 03/26/2019 CLINICAL DATA:  Intubation.  NG tube placement. EXAM: CHEST  1 VIEW COMPARISON:  Prior chest x-ray same day. FINDINGS: Endotracheal tube and left subclavian line stable position. NG tube noted with tip below left hemidiaphragm. Heart size stable. Diffuse bilateral pulmonary infiltrates/edema again noted. No pneumothorax. IMPRESSION: 1. Endotracheal tube and left subclavian line stable  position. Interim placement of NG tube with noted with tip below left hemidiaphragm. 2. Diffuse bilateral pulmonary infiltrates/edema again noted. No interim change. Electronically Signed   By: Maisie Fus  Register   On: 03/26/2019 12:55   Dg Abd 1 View  Result Date: 03/26/2019 CLINICAL DATA:  NG tube advancement. EXAM: ABDOMEN - 1 VIEW COMPARISON:  Chest x-ray same day.  KUB 03/04/2019. FINDINGS: NG tube noted with tip coiled stomach. No bowel distention. Bibasilar pulmonary infiltrates. Surgical clips right upper quadrant. IMPRESSION: NG tube noted with tip coiled in stomach. Electronically Signed   By: Maisie Fus  Register   On: 03/26/2019 12:57   Dg Chest Port 1 View  Result Date: 03/28/2019 CLINICAL DATA:  Intubation, COVID-19 EXAM: PORTABLE CHEST 1 VIEW COMPARISON:  Portable exam 0530 hours FINDINGS: Tip of endotracheal tube projects 6.2 cm above carina. Feeding tube extends into stomach. LEFT subclavian line with tip projecting over SVC. Stable heart size and mediastinal contours. BILATERAL airspace infiltrates greatest at RIGHT base, slightly improved. No pleural effusion or pneumothorax. Bones demineralized. IMPRESSION: Slightly improved airspace infiltrates. Electronically Signed   By: Ulyses Southward M.D.   On: 03/28/2019 08:45   Dg Chest Port 1 View  Result Date: 03/27/2019 CLINICAL DATA:  Endotracheal tube placement. EXAM: PORTABLE CHEST 1 VIEW COMPARISON:  March 26, 2019. FINDINGS: Stable cardiomediastinal silhouette. Endotracheal and nasogastric tubes are unchanged in position. Left subclavian catheter is unchanged. No pneumothorax is noted. Stable bilateral lung opacities are noted concerning for multifocal pneumonia. Bony thorax is unremarkable. IMPRESSION: Stable support apparatus. Stable bilateral lung opacities are noted concerning for multifocal pneumonia. Electronically Signed   By: Lupita Raider M.D.   On: 03/27/2019 07:51       LOS: 3 days   Kimberlin Scheel Rito Ehrlich  [facility]  Pager on [email or web address]  03/28/2019, 12:24 PM

## 2019-03-28 NOTE — Progress Notes (Signed)
Pt becomes agitated during assessment, pulling against wrist restraints, flopping legs. Respirations become labored, with abdominal. breathing, high pressuring on vent. Patient does not calm to command or emotional support, will not follow commands. Respiratory rate up in 40's, heart rate up to 120''s. Increased patients Propofol due to increased agitation and increase work of breathing.

## 2019-03-29 ENCOUNTER — Inpatient Hospital Stay (HOSPITAL_COMMUNITY): Payer: PRIVATE HEALTH INSURANCE

## 2019-03-29 DIAGNOSIS — J9601 Acute respiratory failure with hypoxia: Secondary | ICD-10-CM | POA: Diagnosis not present

## 2019-03-29 DIAGNOSIS — G9340 Encephalopathy, unspecified: Secondary | ICD-10-CM | POA: Diagnosis not present

## 2019-03-29 DIAGNOSIS — J810 Acute pulmonary edema: Secondary | ICD-10-CM | POA: Diagnosis not present

## 2019-03-29 DIAGNOSIS — U071 COVID-19: Secondary | ICD-10-CM | POA: Diagnosis not present

## 2019-03-29 LAB — CBC WITH DIFFERENTIAL/PLATELET
Abs Immature Granulocytes: 0.06 10*3/uL (ref 0.00–0.07)
Basophils Absolute: 0 10*3/uL (ref 0.0–0.1)
Basophils Relative: 0 %
Eosinophils Absolute: 0 10*3/uL (ref 0.0–0.5)
Eosinophils Relative: 0 %
HCT: 38.8 % — ABNORMAL LOW (ref 39.0–52.0)
Hemoglobin: 12 g/dL — ABNORMAL LOW (ref 13.0–17.0)
Immature Granulocytes: 0 %
Lymphocytes Relative: 12 %
Lymphs Abs: 1.7 10*3/uL (ref 0.7–4.0)
MCH: 27.5 pg (ref 26.0–34.0)
MCHC: 30.9 g/dL (ref 30.0–36.0)
MCV: 89 fL (ref 80.0–100.0)
Monocytes Absolute: 1.9 10*3/uL — ABNORMAL HIGH (ref 0.1–1.0)
Monocytes Relative: 13 %
Neutro Abs: 11 10*3/uL — ABNORMAL HIGH (ref 1.7–7.7)
Neutrophils Relative %: 75 %
Platelets: 465 10*3/uL — ABNORMAL HIGH (ref 150–400)
RBC: 4.36 MIL/uL (ref 4.22–5.81)
RDW: 13.7 % (ref 11.5–15.5)
WBC: 14.7 10*3/uL — ABNORMAL HIGH (ref 4.0–10.5)
nRBC: 0 % (ref 0.0–0.2)

## 2019-03-29 LAB — COMPREHENSIVE METABOLIC PANEL
ALT: 24 U/L (ref 0–44)
AST: 34 U/L (ref 15–41)
Albumin: 2.8 g/dL — ABNORMAL LOW (ref 3.5–5.0)
Alkaline Phosphatase: 73 U/L (ref 38–126)
Anion gap: 15 (ref 5–15)
BUN: 67 mg/dL — ABNORMAL HIGH (ref 6–20)
CO2: 33 mmol/L — ABNORMAL HIGH (ref 22–32)
Calcium: 9.1 mg/dL (ref 8.9–10.3)
Chloride: 88 mmol/L — ABNORMAL LOW (ref 98–111)
Creatinine, Ser: 1.94 mg/dL — ABNORMAL HIGH (ref 0.61–1.24)
GFR calc Af Amer: 43 mL/min — ABNORMAL LOW (ref >60)
GFR calc non Af Amer: 37 mL/min — ABNORMAL LOW (ref >60)
Glucose, Bld: 319 mg/dL — ABNORMAL HIGH (ref 70–99)
Potassium: 4 mmol/L (ref 3.5–5.1)
Sodium: 136 mmol/L (ref 135–145)
Total Bilirubin: 0.6 mg/dL (ref 0.3–1.2)
Total Protein: 8 g/dL (ref 6.5–8.1)

## 2019-03-29 LAB — GLUCOSE, CAPILLARY
Glucose-Capillary: 312 mg/dL — ABNORMAL HIGH (ref 70–99)
Glucose-Capillary: 384 mg/dL — ABNORMAL HIGH (ref 70–99)
Glucose-Capillary: 344 mg/dL — ABNORMAL HIGH (ref 70–99)
Glucose-Capillary: 331 mg/dL — ABNORMAL HIGH (ref 70–99)
Glucose-Capillary: 340 mg/dL — ABNORMAL HIGH (ref 70–99)
Glucose-Capillary: 393 mg/dL — ABNORMAL HIGH (ref 70–99)

## 2019-03-29 LAB — POCT I-STAT 7, (LYTES, BLD GAS, ICA,H+H)
Acid-Base Excess: 11 mmol/L — ABNORMAL HIGH (ref 0.0–2.0)
Acid-Base Excess: 8 mmol/L — ABNORMAL HIGH (ref 0.0–2.0)
Bicarbonate: 36.1 mmol/L — ABNORMAL HIGH (ref 20.0–28.0)
Bicarbonate: 33.1 mmol/L — ABNORMAL HIGH (ref 20.0–28.0)
Calcium, Ion: 1.14 mmol/L — ABNORMAL LOW (ref 1.15–1.40)
Calcium, Ion: 1.06 mmol/L — ABNORMAL LOW (ref 1.15–1.40)
HCT: 39 % (ref 39.0–52.0)
HCT: 38 % — ABNORMAL LOW (ref 39.0–52.0)
Hemoglobin: 13.3 g/dL (ref 13.0–17.0)
Hemoglobin: 12.9 g/dL — ABNORMAL LOW (ref 13.0–17.0)
O2 Saturation: 90 %
O2 Saturation: 92 %
Patient temperature: 102
Patient temperature: 39
Potassium: 3.9 mmol/L (ref 3.5–5.1)
Potassium: 3.6 mmol/L (ref 3.5–5.1)
Sodium: 136 mmol/L (ref 135–145)
Sodium: 138 mmol/L (ref 135–145)
TCO2: 38 mmol/L — ABNORMAL HIGH (ref 22–32)
TCO2: 35 mmol/L — ABNORMAL HIGH (ref 22–32)
pCO2 arterial: 51.8 mmHg — ABNORMAL HIGH (ref 32.0–48.0)
pCO2 arterial: 52.1 mmHg — ABNORMAL HIGH (ref 32.0–48.0)
pH, Arterial: 7.458 — ABNORMAL HIGH (ref 7.350–7.450)
pH, Arterial: 7.42 (ref 7.350–7.450)
pO2, Arterial: 62 mmHg — ABNORMAL LOW (ref 83.0–108.0)
pO2, Arterial: 71 mmHg — ABNORMAL LOW (ref 83.0–108.0)

## 2019-03-29 LAB — URINALYSIS, ROUTINE W REFLEX MICROSCOPIC
Bilirubin Urine: NEGATIVE
Glucose, UA: NEGATIVE mg/dL
Hgb urine dipstick: NEGATIVE
Ketones, ur: NEGATIVE mg/dL
Leukocytes,Ua: NEGATIVE
Nitrite: NEGATIVE
Protein, ur: NEGATIVE mg/dL
Specific Gravity, Urine: 1.02 (ref 1.005–1.030)
pH: 5 (ref 5.0–8.0)

## 2019-03-29 LAB — PHOSPHORUS: Phosphorus: 4 mg/dL (ref 2.5–4.6)

## 2019-03-29 LAB — EXPECTORATED SPUTUM ASSESSMENT W GRAM STAIN, RFLX TO RESP C

## 2019-03-29 LAB — PROCALCITONIN: Procalcitonin: 4.01 ng/mL

## 2019-03-29 LAB — C-REACTIVE PROTEIN: CRP: 3.8 mg/dL — ABNORMAL HIGH (ref <1.0)

## 2019-03-29 LAB — FERRITIN: Ferritin: 372 ng/mL — ABNORMAL HIGH (ref 24–336)

## 2019-03-29 LAB — MAGNESIUM: Magnesium: 1.7 mg/dL (ref 1.7–2.4)

## 2019-03-29 LAB — D-DIMER, QUANTITATIVE: D-Dimer, Quant: 2.61 ug/mL-FEU — ABNORMAL HIGH (ref 0.00–0.50)

## 2019-03-29 MED ORDER — PIPERACILLIN-TAZOBACTAM 3.375 G IVPB
3.3750 g | Freq: Three times a day (TID) | INTRAVENOUS | Status: DC
  Administered 2019-03-29 – 2019-04-01 (×9): 3.375 g via INTRAVENOUS
  Filled 2019-03-29 (×10): qty 50

## 2019-03-29 MED ORDER — VANCOMYCIN HCL 10 G IV SOLR
2000.0000 mg | Freq: Once | INTRAVENOUS | Status: AC
  Administered 2019-03-29: 2000 mg via INTRAVENOUS
  Filled 2019-03-29: qty 2000

## 2019-03-29 MED ORDER — INSULIN GLARGINE 100 UNIT/ML ~~LOC~~ SOLN
35.0000 [IU] | Freq: Two times a day (BID) | SUBCUTANEOUS | Status: DC
  Administered 2019-03-29 (×2): 35 [IU] via SUBCUTANEOUS
  Filled 2019-03-29 (×3): qty 0.35

## 2019-03-29 MED ORDER — INSULIN ASPART 100 UNIT/ML ~~LOC~~ SOLN
8.0000 [IU] | SUBCUTANEOUS | Status: DC
  Administered 2019-03-29 – 2019-03-30 (×4): 8 [IU] via SUBCUTANEOUS

## 2019-03-29 MED ORDER — PIPERACILLIN-TAZOBACTAM 3.375 G IVPB 30 MIN
3.3750 g | Freq: Once | INTRAVENOUS | Status: AC
  Administered 2019-03-29: 3.375 g via INTRAVENOUS
  Filled 2019-03-29: qty 50

## 2019-03-29 MED ORDER — DEXMEDETOMIDINE HCL IN NACL 400 MCG/100ML IV SOLN
0.4000 ug/kg/h | INTRAVENOUS | Status: DC
  Administered 2019-03-29: 1.1 ug/kg/h via INTRAVENOUS
  Administered 2019-03-29: 0.4 ug/kg/h via INTRAVENOUS
  Administered 2019-03-29: 0.8 ug/kg/h via INTRAVENOUS
  Administered 2019-03-30 – 2019-04-01 (×14): 1.2 ug/kg/h via INTRAVENOUS
  Administered 2019-04-01: 15:00:00 0.6 ug/kg/h via INTRAVENOUS
  Administered 2019-04-01: 06:00:00 0.5 ug/kg/h via INTRAVENOUS
  Administered 2019-04-01 – 2019-04-02 (×2): 0.8 ug/kg/h via INTRAVENOUS
  Administered 2019-04-02: 0.4 ug/kg/h via INTRAVENOUS
  Administered 2019-04-02: 06:00:00 0.8 ug/kg/h via INTRAVENOUS
  Administered 2019-04-03 – 2019-04-05 (×7): 0.4 ug/kg/h via INTRAVENOUS
  Administered 2019-04-06 (×3): 1 ug/kg/h via INTRAVENOUS
  Administered 2019-04-06: 1.2 ug/kg/h via INTRAVENOUS
  Administered 2019-04-06: 1 ug/kg/h via INTRAVENOUS
  Administered 2019-04-06: 1.4 ug/kg/h via INTRAVENOUS
  Filled 2019-03-29 (×37): qty 100

## 2019-03-29 MED ORDER — VANCOMYCIN HCL IN DEXTROSE 1-5 GM/200ML-% IV SOLN
1000.0000 mg | INTRAVENOUS | Status: DC
  Administered 2019-03-30: 1000 mg via INTRAVENOUS
  Filled 2019-03-29 (×2): qty 200

## 2019-03-29 MED ORDER — ACETAMINOPHEN 10 MG/ML IV SOLN
1000.0000 mg | Freq: Once | INTRAVENOUS | Status: AC
  Administered 2019-03-29: 1000 mg via INTRAVENOUS
  Filled 2019-03-29: qty 100

## 2019-03-29 NOTE — Plan of Care (Signed)
Assumed care of Angel Stuart at 0700. Angel Stuart sedated and on the ventilator. Angel Stuart not responding to verbal or painful stimuli. Dr. Nelda Marseille and Dr. Maryland Pink notified. Stopped sedation gtts for further neuro assessment.  Angel Stuart still not responding to painful stimulus. Dr. Maryland Pink aware. Restarted sedation gtt for vent dyssynchrony. Angel Stuart febrile. Placed ice packs and cooling blanket on Angel Stuart. Tylenol PRN per order parameters. Dr. Maryland Pink aware of Angel Stuart's continued fevering state. No new orders. Will continue to monitor Angel Stuart.    Problem: Coping: Goal: Psychosocial and spiritual needs will be supported Outcome: Progressing   Problem: Respiratory: Goal: Will maintain a patent airway Outcome: Progressing Goal: Complications related to the disease process, condition or treatment will be avoided or minimized Outcome: Progressing   Problem: Education: Goal: Knowledge of General Education information will improve Description: Including pain rating scale, medication(s)/side effects and non-pharmacologic comfort measures Outcome: Progressing   Problem: Health Behavior/Discharge Planning: Goal: Ability to manage health-related needs will improve Outcome: Progressing   Problem: Clinical Measurements: Goal: Ability to maintain clinical measurements within normal limits will improve Outcome: Progressing Goal: Will remain free from infection Outcome: Progressing Goal: Diagnostic test results will improve Outcome: Progressing Goal: Respiratory complications will improve Outcome: Progressing Goal: Cardiovascular complication will be avoided Outcome: Progressing   Problem: Activity: Goal: Risk for activity intolerance will decrease Outcome: Progressing   Problem: Nutrition: Goal: Adequate nutrition will be maintained Outcome: Progressing   Problem: Coping: Goal: Level of anxiety will decrease Outcome: Progressing   Problem: Elimination: Goal: Will not experience complications related to bowel motility Outcome:  Progressing Goal: Will not experience complications related to urinary retention Outcome: Progressing   Problem: Pain Managment: Goal: General experience of comfort will improve Outcome: Progressing   Problem: Safety: Goal: Ability to remain free from injury will improve Outcome: Progressing   Problem: Skin Integrity: Goal: Risk for impaired skin integrity will decrease Outcome: Progressing

## 2019-03-29 NOTE — Plan of Care (Signed)
Pt continued with fever throughout shift.  Administered Tylenol PO q6hrs with one dose of IV Tylenol for fever 103.3 Ax.  Placed esopheal temp probe on pt to closely monitor temp.  Continue to use ice packs around body: armspits, groin, behind head/neck area; also placed couple on abdomen.  Pt also pan cultured during shift- awaiting results.  Problem: Clinical Measurements: Goal: Will remain free from infection Outcome: Not Progressing Goal: Respiratory complications will improve Outcome: Not Progressing

## 2019-03-29 NOTE — Progress Notes (Signed)
   03/29/19 0059  Vitals  ECG Heart Rate (!) 121  Resp (!) 31  Oxygen Therapy  SpO2 (!) 86 %  O2 Device Ventilator  FiO2 (%) 70 %    Sats staying down to 86%.  Temp remains elevated at 102.3 F Axillary.  Discussed issue with RT.  Increased FiO2 from 50 to 70% on Vent per RT recommendation.  Also obtained a sputum specimen.

## 2019-03-29 NOTE — Progress Notes (Signed)
Pharmacy Antibiotic Note  Angel Stuart is a 58 y.o. male admitted on 04-15-2019 with pneumonia.  Pharmacy has been consulted for Zosyn and vancomycin dosing. Patient has been febrile throughout the night. Tm 103.8. Worsening leukocytosis. SCr has trended up to 1.94.   Plan: -Zosyn 3.375 gm IV Q 8 hours (EI infusion) -Vancomycin 2000 mg IV once, then start Vancomycin 1000 mg IV Q 24 hrs. Goal AUC 400-550. Expected AUC: 464 SCr used: 1.94 -Monitor CBC, renal fx, cultures and clinical progress -Vanc peak/trough as indicated     Height: 5\' 11"  (180.3 cm) Weight: 234 lb 2.1 oz (106.2 kg) IBW/kg (Calculated) : 75.3  Temp (24hrs), Avg:102.9 F (39.4 C), Min:101 F (38.3 C), Max:104.1 F (40.1 C)  Recent Labs  Lab 15-Apr-2019 1300 03/26/19 0529 03/27/19 0445 03/28/19 0450 03/29/19 0050  WBC 25.2* 16.1* 11.8* 11.3* 14.7*  CREATININE 0.83 1.20 1.70* 1.70* 1.94*    Estimated Creatinine Clearance: 50.9 mL/min (A) (by C-G formula based on SCr of 1.94 mg/dL (H)).    Allergies  Allergen Reactions  . Clindamycin Hcl Other (See Comments)    Other reaction(s): Other (See Comments) Other reaction(s): ANAPHYLAXIS Other reaction(s): ANAPHYLAXIS Other reaction(s): Other (See Comments) Other reaction(s): ANAPHYLAXIS Other reaction(s): ANAPHYLAXIS Other reaction(s): ANAPHYLAXIS   . Semaglutide Nausea And Vomiting  . Hydromorphone Nausea And Vomiting    Per patient Per patient Per patient     Antimicrobials this admission: Vanc 10/30 >>  Zosyn 10/30 >>   Dose adjustments this admission:   Microbiology results: 10/30 Sputum: pending 10/29 BCx: ngtd  10/26 MRSA PCR: neg   Thank you for allowing pharmacy to be a part of this patient's care.  Albertina Parr, PharmD., BCPS Clinical Pharmacist Clinical phone for 03/29/19 until 5pm: [phone]

## 2019-03-29 NOTE — Progress Notes (Signed)
Spoke with pt's wife Melissa. Updated her on pt's condition. Informed her of pt's temperature and current vent settings. Informed her of pt's current plan of care. No other questions at this time. Told wife to call if any were to arise.

## 2019-03-29 NOTE — Progress Notes (Signed)
Inpatient Diabetes Program Recommendations  AACE/ADA: New Consensus Statement on Inpatient Glycemic Control  Target Ranges:  Prepandial:   less than 140 mg/dL      Peak postprandial:   less than 180 mg/dL (1-2 hours)      Critically ill patients:  140 - 180 mg/dL   Results for Angel Stuart, Angel Stuart (MRN [id number]) as of 03/29/2019 10:13  Ref. Range 03/28/2019 07:49 03/28/2019 11:40 03/28/2019 15:47 03/28/2019 19:46 03/28/2019 23:31 03/29/2019 04:33 03/29/2019 07:54  Glucose-Capillary Latest Ref Range: 70 - 99 mg/dL 197 (H) 297 (H) 360 (H) 331 (H) 322 (H) 312 (H) 384 (H)   Review of Glycemic Control  Diabetes history:DM2 Outpatient Diabetes medications:Lantus 60 units daily, Tradjenta 5 mg daily, Metformin 1000 mg BID Current orders for Inpatient glycemic control:Lantus 35 units BID, Novolog 0-20 units Q4H; Decadron 6 mg Q24H, Vital @ 70 ml/hr  Inpatient Diabetes Program Recommendations:  Insulin-Basal:Noted Lanuts increased to 35 units BID (to start at bedtime today).  Insulin-Tube Feeding Coverage:Please consider ordering Novolog 8 units Q4H for tube feeding coverage. If tube feeding is stopped or held then Novolog tube feeding coverage should also be stopped or held.  Insulin--If glucose continues to be consistently greater than 200 mg/dl, please consider discontinuing SQ insulin and ordering ICU Glycemic Control Phase 2 IV insulin to improve glycemic control and help determine insulin needs.  Thanks, Barnie Alderman, RN, MSN, CDE Diabetes Coordinator Inpatient Diabetes Program [phone] (Team Pager from 8am to 5pm)

## 2019-03-29 NOTE — Progress Notes (Signed)
NAME:  Angel Stuart, MRN:  [id number], DOB:  12/11/1960, LOS: 4 ADMISSION DATE:  03/30/2019, CONSULTATION DATE:  10/26 REFERRING MD:  Sharon SellerMcClung, CHIEF COMPLAINT:  Dyspnea   Brief History   58 y/o male admitted on 10/26 in setting of ARDS from COVID 19 pneumonia, required intubation in [facility].  History of present illness   This is a 58 y/o male with HTN, HLD, DM2 who was [facility] to [facility] ICU on 10/26 in the setting of ARDS from COVID 19 pneumonia.  He went to the [facility] ER where he was noted to have an O2 saturation of 53% on RA.  He was briefly treated with BIPAP, then intubated.  He was intubated on arrival here so I could not obtain a history from him.  History was obtained by reviewing records from [facility].  Past Medical History  HTN HLD DM2  Significant Hospital Events   10/26 admission to ICU  Consults:  PCCM  Procedures:  10/26 ETT >  10/26 L subclavian CVL >   Significant Diagnostic Tests:    Micro Data:  10/26 SARS COV 2 [facility]) > positive Blood 10/30>>> Urine 10/30>>> Sputum 10/30>>>  Antimicrobials:  10/26 decadron>  10/26 remdesivir >  Vancomycin 10/30>>> Zosyn 10/30>>>  Interim history/subjective:  Febrile overnight Higher O2 demand overnight Intermittent agitation  Objective   Blood pressure 95/66, pulse (!) 124, temperature (!) 103.3 F (39.6 C), temperature source Esophageal, resp. rate (!) 23, height 5\' 11"  (1.803 m), weight 106.2 kg, SpO2 92 %.    Vent Mode: PRVC FiO2 (%):  [40 %-70 %] 70 % Set Rate:  [24 bmp] 24 bmp Vt Set:  [600 mL] 600 mL PEEP:  [5 cmH20-10 cmH20] 10 cmH20 Plateau Pressure:  [19 cmH20-30 cmH20] 25 cmH20   Intake/Output Summary (Last 24 hours) at 03/29/2019 0907 Last data filed at 03/29/2019 0800 Gross per 24 hour  Intake 2873.79 ml  Output 3775 ml  Net -901.21 ml   Filed Weights   03/27/19 0500 03/28/19 0500 03/29/19 0500  Weight: 109.2 kg 109 kg 106.2 kg   Examination:   General:  Acutely ill appearing male, NAD, sedated on the vent HENT: [place]/AT, PERRL, EOM-I and MMM, ETT in place PULM: Diffuse infiltrate CV: RRR, Nl S1/S2 and -M/R/G GI: Soft, NT, ND and +BS MSK: 1+ edema and -tenderness Neuro: Sedated and intubated Skin: Intact  I reviewed CXR myself,   Discussed with TRH-MD  Resolved Hospital Problem list   N/A  Assessment & Plan:  ARDS due to COVID 19 pneumonia: severe hypoxemia, worsening hypercarbia Continue mechanical ventilation per ARDS protocol Target TVol 6-8cc/kgIBW Target Plateau Pressure < 30cm H20 Target driving pressure less than 15 cm of water Target PaO2 55-65: titrate PEEP/FiO2 per protocol As long as PaO2 to FiO2 ratio is less than 1:150 position in prone position for 16 hours a day Follow CVP with target of 4 Cr holding, reorder dose of lasix from yesterday Ventilator associated pneumonia prevention protocol Advance ETT down 2 cm  If tolerated then begin PS trials D/Ced proning D/Ced versed drip and change to PRN pushes  Need for sedation for mechanical ventilation: high sedation needs Continue oral sedation: oxycodone, clonazepam D/C paralytic orderset, no longer needing proning Change RASS score to -2 as target D/C propofol Precedex Fentanyl drip  PCCM will continue to follow  Best practice:  Diet: start tube feeding Pain/Anxiety/Delirium protocol (if indicated): as above, continuous paralytic protocol VAP protocol (if indicated): yes DVT prophylaxis: lovenox per  protocol, discuss COVID PACT with the patient's family GI prophylaxis: pantoprazole Glucose control: SSI Mobility: bed rest Code Status: full Family Communication: per [facility] Disposition: remain in ICU  Labs   CBC: Recent Labs  Lab 2019-03-30 1300  03/26/19 0529  03/27/19 0445  03/27/19 1141 03/28/19 0344 03/28/19 0450 03/29/19 0050 03/29/19 0259  WBC 25.2*  --  16.1*  --  11.8*  --   --   --  11.3* 14.7*  --   NEUTROABS 21.5*  --  13.0*   --  8.6*  --   --   --  7.3 11.0*  --   HGB 11.5*   < > 9.8*   < > 10.0*   < > 10.2* 10.5* 10.3* 12.0* 13.3  HCT 37.1*   < > 31.5*   < > 32.1*   < > 30.0* 31.0* 33.2* 38.8* 39.0  MCV 91.8  --  90.3  --  89.9  --   --   --  90.0 89.0  --   PLT 625*  --  544*  --  476*  --   --   --  452* 465*  --    < > = values in this interval not displayed.    Basic Metabolic Panel: Recent Labs  Lab 2019-03-30 1300  03/26/19 0529  03/27/19 0445  03/27/19 1141 03/28/19 0344 03/28/19 0450 03/29/19 0050 03/29/19 0259  NA 136   < > 141   < > 140   < > 138 138 137 136 136  K 5.4*   < > 4.4   < > 3.8   < > 4.2 4.1 4.0 4.0 3.9  CL 102  --  102  --  94*  --   --   --  92* 88*  --   CO2 25  --  30  --  33*  --   --   --  35* 33*  --   GLUCOSE 294*  --  204*  --  263*  --   --   --  221* 319*  --   BUN 14  --  28*  --  45*  --   --   --  55* 67*  --   CREATININE 0.83  --  1.20  --  1.70*  --   --   --  1.70* 1.94*  --   CALCIUM 8.1*  --  7.9*  --  8.7*  --   --   --  8.9 9.1  --   MG  --   --  1.9  --  1.8  --   --   --  1.7 1.7  --   PHOS  --   --   --   --  3.0  --   --   --  4.1 4.0  --    < > = values in this interval not displayed.   GFR: Estimated Creatinine Clearance: 50.9 mL/min (A) (by C-G formula based on SCr of 1.94 mg/dL (H)). Recent Labs  Lab 03/30/19 1300 03/26/19 0529 03/27/19 0445 03/28/19 0450 03/29/19 0050  PROCALCITON 0.27 0.21 0.22  --   --   WBC 25.2* 16.1* 11.8* 11.3* 14.7*    Liver Function Tests: Recent Labs  Lab 03/30/2019 1300 03/26/19 0529 03/27/19 0445 03/28/19 0450 03/29/19 0050  AST 28 27 39 34 34  ALT 18 17 21 24 24   ALKPHOS 106 90 82 73 73  BILITOT 0.4 0.2* 0.4 0.4  0.6  PROT 7.2 6.4* 6.8 7.2 8.0  ALBUMIN 2.7* 2.3* 2.4* 2.5* 2.8*   No results for input(s): LIPASE, AMYLASE in the last 168 hours. No results for input(s): AMMONIA in the last 168 hours.  ABG    Component Value Date/Time   PHART 7.458 (H) 03/29/2019 0259   PCO2ART 51.8 (H) 03/29/2019  0259   PO2ART 62.0 (L) 03/29/2019 0259   HCO3 36.1 (H) 03/29/2019 0259   TCO2 38 (H) 03/29/2019 0259   ACIDBASEDEF 3.0 (H) 2019/04/10 1442   O2SAT 90.0 03/29/2019 0259     Coagulation Profile: No results for input(s): INR, PROTIME in the last 168 hours.  Cardiac Enzymes: No results for input(s): CKTOTAL, CKMB, CKMBINDEX, TROPONINI in the last 168 hours.  HbA1C: Hgb A1c MFr Bld  Date/Time Value Ref Range Status  April 10, 2019 01:00 PM 7.5 (H) 4.8 - 5.6 % Final    Comment:    (NOTE) Pre diabetes:          5.7%-6.4% Diabetes:              >6.4% Glycemic control for   <7.0% adults with diabetes     CBG: Recent Labs  Lab 03/28/19 1547 03/28/19 1946 03/28/19 2331 03/29/19 0433 03/29/19 0754  GLUCAP 360* 331* 322* 312* 384*   The patient is critically ill with multiple organ systems failure and requires high complexity decision making for assessment and support, frequent evaluation and titration of therapies, application of advanced monitoring technologies and extensive interpretation of multiple databases.   Critical Care Time devoted to patient care services described in this note is  32  Minutes. This time reflects time of care of this signee Dr Koren Bound. This critical care time does not reflect procedure time, or teaching time or supervisory time of PA/NP/Med student/Med Resident etc but could involve care discussion time.  Alyson Reedy, M.D. [facility] Pulmonary/Critical Care Medicine.

## 2019-03-29 NOTE — Progress Notes (Signed)
PROGRESS NOTE  Uthman Mroczkowski FWY:[id number] DOB: 1961-05-15 DOA: 17-Apr-2019  PCP: Doreatha Lew, MD  Brief History/Interval Summary: 58 year old with a history of hyperlipidemia and DM 2 who began to develop upper respiratory symptoms on 10/5, and ultimately tested positive for Covid on 10/9. Records suggest he experienced a gradual progression of symptoms with acute worsening of shortness of breath on 10/25, which prompted him to present to the [facility] ED. In the [place] ED he was noted to have a saturation of 63% on room air. Chest x-ray noted severe bilateral infiltrates. He rapidly decompensated in the emergency room. He failed a trial of BiPAP and had to be intubated. He was subsequently transferred to [facility] to the ICU. At the time of my exam he is intubated and sedated.  Reason for Visit: Acute respiratory failure with hypoxia.  Pneumonia due to COVID-19  Consultants: Pulmonology  Procedures: Intubated at [facility] ED  Antibiotics: Anti-infectives (From admission, onward)   Start     Dose/Rate Route Frequency Ordered Stop   03/29/19 2000  piperacillin-tazobactam (ZOSYN) IVPB 3.375 g     3.375 g 12.5 mL/hr over 240 Minutes Intravenous Every 8 hours 03/29/19 1234     03/29/19 1300  piperacillin-tazobactam (ZOSYN) IVPB 3.375 g     3.375 g 100 mL/hr over 30 Minutes Intravenous  Once 03/29/19 1228     03/29/19 1300  vancomycin (VANCOCIN) 2,000 mg in sodium chloride 0.9 % 500 mL IVPB     2,000 mg 250 mL/hr over 120 Minutes Intravenous  Once 03/29/19 1228     03/26/19 1000  remdesivir 100 mg in sodium chloride 0.9 % 250 mL IVPB     100 mg 500 mL/hr over 30 Minutes Intravenous Every 24 hours 04/17/19 1031 03/29/19 1034      Subjective/Interval History: Patient remains intubated and sedated   Assessment/Plan:  Acute Hypoxic Resp. Failure/Pneumonia due to COVID-19  Vent Mode: PRVC FiO2 (%):  [40 %-70 %] 60 % Set Rate:  [20 bmp-24 bmp] 20 bmp  Vt Set:  [600 mL] 600 mL PEEP:  [5 YDX41-28 cmH20] 12 cmH20 Plateau Pressure:  [19 cmH20-30 cmH20] 25 cmH20     Component Value Date/Time   PHART 7.458 (H) 03/29/2019 0259   PCO2ART 51.8 (H) 03/29/2019 0259   PO2ART 62.0 (L) 03/29/2019 0259   HCO3 36.1 (H) 03/29/2019 0259   TCO2 38 (H) 03/29/2019 0259   ACIDBASEDEF 3.0 (H) 2019/04/17 1442   O2SAT 90.0 03/29/2019 0259    COVID-19 Labs  Recent Labs    03/27/19 0445 03/28/19 0450 03/29/19 0050  DDIMER 3.11* 3.10* 2.61*  FERRITIN 286 289 372*  CRP 9.7* 5.2* 3.8*    Patient had positive COVID-19 result on 10/9.  This was either at [facility] or one of the clinics in that area.  Fever: Patient with persistent fevers since yesterday.  See below Oxygen requirements: Mechanical ventilation.  70% FiO2.  Saturating in the early 90s. Antibacterials: Vancomycin and Zosyn initiated 10/30 Remdesivir: Day 5 Steroids: Dexamethasone 6 mg daily Diuretics: Being given diuretics including Lasix and metolazone on a daily basis depending on volume status. Actemra: Not given Convalescent plasma: Transfused on 10/26 Vitamin C and Zinc: Continue DVT Prophylaxis:  Lovenox 55 mg every 12 hours  Patient remains intubated and sedated.  Pulmonology is following and managing.  Chest x-ray from this morning suggests slight improvement.  However patient's FiO2 requirements have gone up.  Due to agitation patient was started on Seroquel yesterday.  He is  also noted to be on Klonopin.  From a COVID-19 standpoint patient remains on remdesivir and steroids.  He will complete course of remdesivir today.  His CRP has improved to 3.8.  D-dimer 2.6.  Ferritin 372.  Looks like Levophed has been weaned off.  Fever Continues to have high fever.  Blood cultures were done overnight.  UA does not suggest infection.  His WBC is noted to be elevated today which could be due to steroids but could also be due to infection.  Discussed with PCCM.  Started patient on  broad-spectrum antibiotics with vancomycin and Zosyn.  We will check procalcitonin levels as well.  Lower extremity Dopplers.    Septic shock Was started on pressors.  It looks like Levophed has been weaned off.  Blood pressure appears to be stable.     Diabetes mellitus type 2, uncontrolled with hyperglycemia HbA1c 7.5.  Elevated CBGs are most likely due to steroids.  Poorly controlled.  Will further increase dose of Lantus.  Continue SSI.  Acute renal failure  BUN and creatinine noted to be slightly higher today.  Most likely due to diuretics.  Monitor urine output.   Normocytic anemia No evidence of overt bleeding.  Continue to monitor hemoglobin.  Obesity Estimated body mass index is 32.65 kg/m as calculated from the following:   Height as of this encounter:  (1.803 m).   Weight as of this encounter: 106.2 kg.  FEN Not on IV fluids.  Monitor electrolytes.  He is getting tube feedings.  DVT Prophylaxis: On Lovenox adjusted for weight PUD Prophylaxis: Protonix once daily Code Status: Full code Family Communication: Wife being updated on a daily basis Disposition Plan: Remain in ICU for now   Medications:  Scheduled: . chlorhexidine  15 mL Mouth/Throat BID  . Chlorhexidine Gluconate Cloth  6 each Topical Daily  . clonazePAM  1 mg Per Tube BID  . dexamethasone (DECADRON) injection  6 mg Intravenous Q24H  . enoxaparin (LOVENOX) injection  55 mg Subcutaneous Q12H  . feeding supplement (PRO-STAT SUGAR FREE 64)  30 mL Per Tube BID  . insulin aspart  0-20 Units Subcutaneous Q4H  . insulin glargine  35 Units Subcutaneous BID  . mouth rinse  15 mL Mouth Rinse 10 times per day  . oxyCODONE  5 mg Per Tube Q6H  . pantoprazole sodium  40 mg Per Tube Daily  . QUEtiapine  25 mg Oral BID  . vitamin C  500 mg Per Tube Daily  . zinc sulfate  220 mg Per Tube Daily   Continuous: . dexmedetomidine (PRECEDEX) IV infusion 0.4 mcg/kg/hr (03/29/19 1242)  . feeding supplement (VITAL AF  1.2 CAL) 70 mL/hr at 03/29/19 1000  . fentaNYL infusion INTRAVENOUS 50 mcg/hr (03/29/19 1159)  . norepinephrine (LEVOPHED) Adult infusion Stopped (03/26/19 1006)  . piperacillin-tazobactam 3.375 g (03/29/19 1310)  . piperacillin-tazobactam (ZOSYN)  IV    . vancomycin 2,000 mg (03/29/19 1306)   ZOX:[id number], bisacodyl, chlorpheniramine-HYDROcodone, fentaNYL, guaiFENesin-dextromethorphan, ondansetron **OR** ondansetron (ZOFRAN) IV, sennosides   Objective:  Vital Signs  Vitals:   03/29/19 0900 03/29/19 1000 03/29/19 1100 03/29/19 1127  BP: 93/67 108/66 102/63   Pulse: (!) 122 (!) 119 (!) 125   Resp: (!) 27 (!) 26 (!) 29   Temp: (!) 103.1 F (39.5 C) (!) 102.6 F (39.2 C) (!) 102.7 F (39.3 C)   TempSrc:      SpO2: (!) 89% 91% (!) 89% (!) 89%  Weight:      Height:  Intake/Output Summary (Last 24 hours) at 03/29/2019 1332 Last data filed at 03/29/2019 1004 Gross per 24 hour  Intake 2762.55 ml  Output 3300 ml  Net -537.45 ml   Filed Weights   03/27/19 0500 03/28/19 0500 03/29/19 0500  Weight: 109.2 kg 109 kg 106.2 kg   General appearance: Intubated and sedated Resp: Coarse breath sounds bilaterally with crackles at the bases.  No wheezing or rhonchi. Cardio: S1-S2 is normal regular.  No S3-S4.  No rubs murmurs or bruit GI: Abdomen is soft.  Nontender nondistended.  Bowel sounds are present normal.  No masses organomegaly Extremities: Minimal edema bilateral lower extremities Neurologic: Sedated.    Lab Results:  Data Reviewed: I have personally reviewed following labs and imaging studies  CBC: Recent Labs  Lab 03/04/2019 1300  03/26/19 0529  03/27/19 0445  03/27/19 1141 03/28/19 0344 03/28/19 0450 03/29/19 0050 03/29/19 0259  WBC 25.2*  --  16.1*  --  11.8*  --   --   --  11.3* 14.7*  --   NEUTROABS 21.5*  --  13.0*  --  8.6*  --   --   --  7.3 11.0*  --   HGB 11.5*   < > 9.8*   < > 10.0*   < > 10.2* 10.5* 10.3* 12.0* 13.3  HCT 37.1*   < > 31.5*    < > 32.1*   < > 30.0* 31.0* 33.2* 38.8* 39.0  MCV 91.8  --  90.3  --  89.9  --   --   --  90.0 89.0  --   PLT 625*  --  544*  --  476*  --   --   --  452* 465*  --    < > = values in this interval not displayed.    Basic Metabolic Panel: Recent Labs  Lab 03/24/2019 1300  03/26/19 0529  03/27/19 0445  03/27/19 1141 03/28/19 0344 03/28/19 0450 03/29/19 0050 03/29/19 0259  NA 136   < > 141   < > 140   < > 138 138 137 136 136  K 5.4*   < > 4.4   < > 3.8   < > 4.2 4.1 4.0 4.0 3.9  CL 102  --  102  --  94*  --   --   --  92* 88*  --   CO2 25  --  30  --  33*  --   --   --  35* 33*  --   GLUCOSE 294*  --  204*  --  263*  --   --   --  221* 319*  --   BUN 14  --  28*  --  45*  --   --   --  55* 67*  --   CREATININE 0.83  --  1.20  --  1.70*  --   --   --  1.70* 1.94*  --   CALCIUM 8.1*  --  7.9*  --  8.7*  --   --   --  8.9 9.1  --   MG  --   --  1.9  --  1.8  --   --   --  1.7 1.7  --   PHOS  --   --   --   --  3.0  --   --   --  4.1 4.0  --    < > = values in this interval not displayed.  GFR: Estimated Creatinine Clearance: 50.9 mL/min (A) (by C-G formula based on SCr of 1.94 mg/dL (H)).  Liver Function Tests: Recent Labs  Lab 03/23/2019 1300 03/26/19 0529 03/27/19 0445 03/28/19 0450 03/29/19 0050  AST 28 27 39 34 34  ALT 18 17 21 24 24   ALKPHOS 106 90 82 73 73  BILITOT 0.4 0.2* 0.4 0.4 0.6  PROT 7.2 6.4* 6.8 7.2 8.0  ALBUMIN 2.7* 2.3* 2.4* 2.5* 2.8*    HbA1C: No results for input(s): HGBA1C in the last 72 hours.  CBG: Recent Labs  Lab 03/28/19 1946 03/28/19 2331 03/29/19 0433 03/29/19 0754 03/29/19 1203  GLUCAP 331* 322* 312* 384* 344*     Anemia Panel: Recent Labs    03/28/19 0450 03/29/19 0050  FERRITIN 289 372*    Recent Results (from the past 240 hour(s))  MRSA PCR Screening     Status: None   Collection Time: 03/27/2019  8:57 PM   Specimen: Nasal Mucosa; Nasopharyngeal  Result Value Ref Range Status   MRSA by PCR NEGATIVE NEGATIVE Final     Comment:        The GeneXpert MRSA Assay (FDA approved for NASAL specimens only), is one component of a comprehensive MRSA colonization surveillance program. It is not intended to diagnose MRSA infection nor to guide or monitor treatment for MRSA infections. Performed at [facility], [address], [place], [place]   Culture, blood (routine x 2)     Status: None (Preliminary result)   Collection Time: 03/28/19 11:02 PM   Specimen: BLOOD  Result Value Ref Range Status   Specimen Description   Final    BLOOD RIGHT ANTECUBITAL Performed at [facility], [address] [place], [place]    Special Requests   Final    BOTTLES DRAWN AEROBIC AND ANAEROBIC Blood Culture adequate volume Performed at [facility], [address], [place], [place]    Culture   Final    NO GROWTH < 12 HOURS Performed at [facility], [address] [place], [place]    Report Status PENDING  Incomplete  Culture, blood (routine x 2)     Status: None (Preliminary result)   Collection Time: 03/28/19 11:40 PM   Specimen: BLOOD LEFT HAND  Result Value Ref Range Status   Specimen Description   Final    BLOOD LEFT HAND Performed at [facility], [address] [place], [place]    Special Requests   Final    BOTTLES DRAWN AEROBIC AND ANAEROBIC Blood Culture adequate volume Performed at [facility], [address] [place], [place]    Culture   Final    NO GROWTH < 12 HOURS Performed at [facility], [address] [place], [place]    Report Status PENDING  Incomplete  Expectorated sputum assessment w rflx to resp cult     Status: None   Collection Time: 03/29/19 12:50 AM   Specimen: Expectorated Sputum  Result Value Ref Range Status   Specimen Description EXPECTORATED SPUTUM  Final   Special Requests Immunocompromised   Final   Sputum evaluation   Final    THIS SPECIMEN IS ACCEPTABLE FOR SPUTUM CULTURE Performed at [facility], [address], [place], [place]    Report Status 03/29/2019 FINAL  Final  Culture, respiratory     Status: None (Preliminary result)   Collection  Time: 03/29/19 12:50 AM  Result Value Ref Range Status   Specimen Description   Final    EXPECTORATED SPUTUM Performed at [facility], [address] [place], [place] [address]    Special Requests   Final    Immunocompromised Reflexed from [id number] Performed at [facility], [address] [place], [place] [address]    Gram Stain   Final    MODERATE WBC PRESENT, PREDOMINANTLY PMN ABUNDANT GRAM NEGATIVE RODS MODERATE GRAM POSITIVE COCCI Performed at [facility], [address] [place], [place] [address]    Culture PENDING  Incomplete   Report Status PENDING  Incomplete      Radiology Studies: Dg Chest Port 1 View  Result Date: 03/29/2019 CLINICAL DATA:  Endotracheal tube placement. EXAM: PORTABLE CHEST 1 VIEW COMPARISON:  03/28/2019 FINDINGS: Endotracheal tube has tip 5.3 cm above the carina. Left subclavian central venous catheter unchanged. Enteric tube courses into the region of the stomach and off the film as tip is not visualized. Lungs are hypoinflated and demonstrate bibasilar airspace opacification right worse than left with slight interval improved aeration. No effusion. Mild prominence of the perihilar vessels. Cardiomediastinal silhouette and remainder of the exam is unchanged. IMPRESSION: Continued bibasilar opacification right worse than left with slight interval improvement. Possible mild vascular congestion. Tubes and lines as described. Electronically Signed   By: Elberta Fortis M.D.   On: 03/29/2019 07:26   Dg Chest Port 1 View  Result Date: 03/28/2019 CLINICAL DATA:  Intubation, COVID-19 EXAM: PORTABLE CHEST 1 VIEW COMPARISON:  Portable  exam 0530 hours FINDINGS: Tip of endotracheal tube projects 6.2 cm above carina. Feeding tube extends into stomach. LEFT subclavian line with tip projecting over SVC. Stable heart size and mediastinal contours. BILATERAL airspace infiltrates greatest at RIGHT base, slightly improved. No pleural effusion or pneumothorax. Bones demineralized. IMPRESSION: Slightly improved airspace infiltrates. Electronically Signed   By: Ulyses Southward M.D.   On: 03/28/2019 08:45       LOS: 4 days   Jaline Pincock Rito Ehrlich  [facility] Pager on [email or web address]  03/29/2019, 1:32 PM

## 2019-03-30 ENCOUNTER — Inpatient Hospital Stay (HOSPITAL_COMMUNITY): Payer: PRIVATE HEALTH INSURANCE

## 2019-03-30 DIAGNOSIS — J810 Acute pulmonary edema: Secondary | ICD-10-CM | POA: Diagnosis not present

## 2019-03-30 DIAGNOSIS — U071 COVID-19: Secondary | ICD-10-CM | POA: Diagnosis not present

## 2019-03-30 DIAGNOSIS — G9340 Encephalopathy, unspecified: Secondary | ICD-10-CM | POA: Diagnosis not present

## 2019-03-30 DIAGNOSIS — J9601 Acute respiratory failure with hypoxia: Secondary | ICD-10-CM | POA: Diagnosis not present

## 2019-03-30 LAB — POCT I-STAT 7, (LYTES, BLD GAS, ICA,H+H)
Acid-Base Excess: 8 mmol/L — ABNORMAL HIGH (ref 0.0–2.0)
Bicarbonate: 32.8 mmol/L — ABNORMAL HIGH (ref 20.0–28.0)
Calcium, Ion: 1.02 mmol/L — ABNORMAL LOW (ref 1.15–1.40)
HCT: 37 % — ABNORMAL LOW (ref 39.0–52.0)
Hemoglobin: 12.6 g/dL — ABNORMAL LOW (ref 13.0–17.0)
O2 Saturation: 93 %
Patient temperature: 101
Potassium: 3.6 mmol/L (ref 3.5–5.1)
Sodium: 136 mmol/L (ref 135–145)
TCO2: 34 mmol/L — ABNORMAL HIGH (ref 22–32)
pCO2 arterial: 50.1 mmHg — ABNORMAL HIGH (ref 32.0–48.0)
pH, Arterial: 7.429 (ref 7.350–7.450)
pO2, Arterial: 72 mmHg — ABNORMAL LOW (ref 83.0–108.0)

## 2019-03-30 LAB — CBC WITH DIFFERENTIAL/PLATELET
Abs Immature Granulocytes: 0.06 10*3/uL (ref 0.00–0.07)
Basophils Absolute: 0 10*3/uL (ref 0.0–0.1)
Basophils Relative: 0 %
Eosinophils Absolute: 0 10*3/uL (ref 0.0–0.5)
Eosinophils Relative: 0 %
HCT: 39.3 % (ref 39.0–52.0)
Hemoglobin: 12.3 g/dL — ABNORMAL LOW (ref 13.0–17.0)
Immature Granulocytes: 1 %
Lymphocytes Relative: 9 %
Lymphs Abs: 1.1 10*3/uL (ref 0.7–4.0)
MCH: 27.7 pg (ref 26.0–34.0)
MCHC: 31.3 g/dL (ref 30.0–36.0)
MCV: 88.5 fL (ref 80.0–100.0)
Monocytes Absolute: 1 10*3/uL (ref 0.1–1.0)
Monocytes Relative: 8 %
Neutro Abs: 10.2 10*3/uL — ABNORMAL HIGH (ref 1.7–7.7)
Neutrophils Relative %: 82 %
Platelets: 389 10*3/uL (ref 150–400)
RBC: 4.44 MIL/uL (ref 4.22–5.81)
RDW: 14 % (ref 11.5–15.5)
WBC: 12.4 10*3/uL — ABNORMAL HIGH (ref 4.0–10.5)
nRBC: 0.2 % (ref 0.0–0.2)

## 2019-03-30 LAB — GLUCOSE, CAPILLARY
Glucose-Capillary: 363 mg/dL — ABNORMAL HIGH (ref 70–99)
Glucose-Capillary: 352 mg/dL — ABNORMAL HIGH (ref 70–99)
Glucose-Capillary: 361 mg/dL — ABNORMAL HIGH (ref 70–99)
Glucose-Capillary: 393 mg/dL — ABNORMAL HIGH (ref 70–99)
Glucose-Capillary: 354 mg/dL — ABNORMAL HIGH (ref 70–99)
Glucose-Capillary: 364 mg/dL — ABNORMAL HIGH (ref 70–99)
Glucose-Capillary: 330 mg/dL — ABNORMAL HIGH (ref 70–99)
Glucose-Capillary: 334 mg/dL — ABNORMAL HIGH (ref 70–99)
Glucose-Capillary: 327 mg/dL — ABNORMAL HIGH (ref 70–99)
Glucose-Capillary: 293 mg/dL — ABNORMAL HIGH (ref 70–99)
Glucose-Capillary: 317 mg/dL — ABNORMAL HIGH (ref 70–99)

## 2019-03-30 LAB — COMPREHENSIVE METABOLIC PANEL
ALT: 24 U/L (ref 0–44)
AST: 31 U/L (ref 15–41)
Albumin: 2.4 g/dL — ABNORMAL LOW (ref 3.5–5.0)
Alkaline Phosphatase: 60 U/L (ref 38–126)
Anion gap: 15 (ref 5–15)
BUN: 93 mg/dL — ABNORMAL HIGH (ref 6–20)
CO2: 28 mmol/L (ref 22–32)
Calcium: 7.7 mg/dL — ABNORMAL LOW (ref 8.9–10.3)
Chloride: 94 mmol/L — ABNORMAL LOW (ref 98–111)
Creatinine, Ser: 1.84 mg/dL — ABNORMAL HIGH (ref 0.61–1.24)
GFR calc Af Amer: 45 mL/min — ABNORMAL LOW (ref >60)
GFR calc non Af Amer: 39 mL/min — ABNORMAL LOW (ref >60)
Glucose, Bld: 383 mg/dL — ABNORMAL HIGH (ref 70–99)
Potassium: 3.3 mmol/L — ABNORMAL LOW (ref 3.5–5.1)
Sodium: 137 mmol/L (ref 135–145)
Total Bilirubin: 1.1 mg/dL (ref 0.3–1.2)
Total Protein: 7.1 g/dL (ref 6.5–8.1)

## 2019-03-30 LAB — PROCALCITONIN: Procalcitonin: 2.82 ng/mL

## 2019-03-30 LAB — C-REACTIVE PROTEIN: CRP: 18.7 mg/dL — ABNORMAL HIGH (ref <1.0)

## 2019-03-30 LAB — FERRITIN: Ferritin: 651 ng/mL — ABNORMAL HIGH (ref 24–336)

## 2019-03-30 LAB — MAGNESIUM: Magnesium: 2.1 mg/dL (ref 1.7–2.4)

## 2019-03-30 LAB — PHOSPHORUS: Phosphorus: 3.3 mg/dL (ref 2.5–4.6)

## 2019-03-30 LAB — D-DIMER, QUANTITATIVE: D-Dimer, Quant: 2.26 ug/mL-FEU — ABNORMAL HIGH (ref 0.00–0.50)

## 2019-03-30 MED ORDER — POTASSIUM CHLORIDE 20 MEQ/15ML (10%) PO SOLN
40.0000 meq | Freq: Once | ORAL | Status: AC
  Administered 2019-03-30: 40 meq
  Filled 2019-03-30: qty 30

## 2019-03-30 MED ORDER — INSULIN REGULAR(HUMAN) IN NACL 100-0.9 UT/100ML-% IV SOLN
INTRAVENOUS | Status: AC
  Administered 2019-03-30: 3.3 [IU]/h via INTRAVENOUS
  Administered 2019-03-31: 17.7 [IU]/h via INTRAVENOUS
  Administered 2019-03-31: 19.2 [IU]/h via INTRAVENOUS
  Administered 2019-03-31: 3.3 [IU]/h via INTRAVENOUS
  Administered 2019-03-31: 23:00:00 13.2 [IU]/h via INTRAVENOUS
  Administered 2019-04-01: 10.1 [IU]/h via INTRAVENOUS
  Administered 2019-04-01: 06:00:00 11 [IU]/h via INTRAVENOUS
  Administered 2019-04-02: 11:00:00 5.1 [IU]/h via INTRAVENOUS
  Filled 2019-03-30 (×6): qty 100

## 2019-03-30 MED ORDER — POTASSIUM CHLORIDE 20 MEQ/15ML (10%) PO SOLN
40.0000 meq | Freq: Three times a day (TID) | ORAL | Status: AC
  Administered 2019-03-30 (×2): 40 meq
  Filled 2019-03-30 (×2): qty 30

## 2019-03-30 MED ORDER — INSULIN GLARGINE 100 UNIT/ML ~~LOC~~ SOLN
45.0000 [IU] | Freq: Two times a day (BID) | SUBCUTANEOUS | Status: DC
  Administered 2019-03-30: 45 [IU] via SUBCUTANEOUS
  Filled 2019-03-30 (×2): qty 0.45

## 2019-03-30 MED ORDER — INSULIN ASPART 100 UNIT/ML ~~LOC~~ SOLN
10.0000 [IU] | SUBCUTANEOUS | Status: DC
  Administered 2019-03-30 (×2): 10 [IU] via SUBCUTANEOUS

## 2019-03-30 MED ORDER — METOLAZONE 10 MG PO TABS
10.0000 mg | ORAL_TABLET | Freq: Once | ORAL | Status: AC
  Administered 2019-03-30: 10 mg via ORAL
  Filled 2019-03-30: qty 1

## 2019-03-30 MED ORDER — FUROSEMIDE 10 MG/ML IJ SOLN
40.0000 mg | Freq: Three times a day (TID) | INTRAMUSCULAR | Status: AC
  Administered 2019-03-30 (×2): 40 mg via INTRAVENOUS
  Filled 2019-03-30 (×2): qty 4

## 2019-03-30 MED ORDER — DEXAMETHASONE SODIUM PHOSPHATE 10 MG/ML IJ SOLN
6.0000 mg | Freq: Two times a day (BID) | INTRAMUSCULAR | Status: DC
  Administered 2019-03-30 – 2019-04-03 (×8): 6 mg via INTRAVENOUS
  Filled 2019-03-30 (×8): qty 1

## 2019-03-30 NOTE — Progress Notes (Signed)
NAME:  Angel Stuart, MRN:  [id number], DOB:  1960-12-08, LOS: 5 ADMISSION DATE:  03/07/2019, CONSULTATION DATE:  10/26 REFERRING MD:  Thereasa Solo, CHIEF COMPLAINT:  Dyspnea   Brief History   58 y/o male admitted on 10/26 in setting of ARDS from [place] 19 pneumonia, required intubation in the [facility] hospital ER.  History of present illness   This is a 58 y/o male with HTN, HLD, DM2 who was admitted to [facility] ICU on 10/26 in the setting of ARDS from COVID 19 pneumonia.  He went to the [facility] ER where he was noted to have an O2 saturation of 53% on RA.  He was briefly treated with BIPAP, then intubated.  He was intubated on arrival here so I could not obtain a history from him.  History was obtained by reviewing records from [facility].  Past Medical History  HTN HLD DM2  Significant Hospital Events   10/26 admission to ICU  Consults:  PCCM  Procedures:  10/26 ETT >  10/26 L subclavian CVL >   Significant Diagnostic Tests:    Micro Data:  10/26 SARS COV 2 [place]) > positive Blood 10/30>>> Urine 10/30>>> Sputum 10/30>>>  Antimicrobials:  10/26 decadron>  10/26 remdesivir >  Vancomycin 10/30>>> Zosyn 10/30>>>  Interim history/subjective:  O2 demand improving overnight No events overnight  Objective   Blood pressure 107/69, pulse (!) 102, temperature (!) 100.9 F (38.3 C), resp. rate (!) 28, height 5\' 11"  (1.803 m), weight 107 kg, SpO2 94 %.    Vent Mode: PRVC FiO2 (%):  [55 %-60 %] 55 % Set Rate:  [20 bmp] 20 bmp Vt Set:  [600 mL] 600 mL PEEP:  [12 cmH20] 12 cmH20 Plateau Pressure:  [25 cmH20-29 cmH20] 29 cmH20   Intake/Output Summary (Last 24 hours) at 03/30/2019 0855 Last data filed at 03/30/2019 0600 Gross per 24 hour  Intake 3209.37 ml  Output 1385 ml  Net 1824.37 ml   Filed Weights   03/28/19 0500 03/29/19 0500 03/30/19 0500  Weight: 109 kg 106.2 kg 107 kg   Examination:  General:  Acutely ill appearing male, NAD, sedated on the vent  HENT: [place]/AT, PERRL, EOM-I and MMM, ETT in place PULM: Diffuse infiltrate CV: RRR, Nl S1/S2 and -M/R/G GI: Soft, NT, ND and +BS MSK: 1+ edema and -tenderness Neuro: Sedated and intubated Skin: Intact  I reviewed CXR, ETT is in a good position and infiltrate noted  Discussed with TRH-MD  Resolved Hospital Problem list   N/A  Assessment & Plan:  ARDS due to COVID 19 pneumonia: severe hypoxemia, worsening hypercarbia Continue mechanical ventilation per ARDS protocol Target TVol 6-8cc/kgIBW Target Plateau Pressure < 30cm H20 Target driving pressure less than 15 cm of water Target PaO2 55-65: titrate PEEP/FiO2 per protocol As long as PaO2 to FiO2 ratio is less than 1:150 position in prone position for 16 hours a day Follow CVP with target of 4 Target today for 50 and 10 Advance ETT down 2 cm D/Ced proning D/Ced versed drip and change to PRN pushes Gentle diureses today as BP allows  Need for sedation for mechanical ventilation: high sedation needs Continue oral sedation: oxycodone, clonazepam D/C paralytic orderset, no longer needing proning Change RASS score to -2 as target D/C propofol Precedex Fentanyl drip  PCCM will continue to follow  Best practice:  Diet: start tube feeding Pain/Anxiety/Delirium protocol (if indicated): as above, continuous paralytic protocol VAP protocol (if indicated): yes DVT prophylaxis: lovenox per protocol, discuss COVID PACT with the  patient's family GI prophylaxis: pantoprazole Glucose control: SSI Mobility: bed rest Code Status: full Family Communication: [facility] Disposition: remain in ICU  Labs   CBC: Recent Labs  Lab 03/26/19 0529  03/27/19 0445  03/28/19 0450 03/29/19 0050 03/29/19 0259 03/29/19 1642 03/30/19 0102 03/30/19 0430  WBC 16.1*  --  11.8*  --  11.3* 14.7*  --   --   --  12.4*  NEUTROABS 13.0*  --  8.6*  --  7.3 11.0*  --   --   --  10.2*  HGB 9.8*   < > 10.0*   < > 10.3* 12.0* 13.3 12.9* 12.6* 12.3*  HCT  31.5*   < > 32.1*   < > 33.2* 38.8* 39.0 38.0* 37.0* 39.3  MCV 90.3  --  89.9  --  90.0 89.0  --   --   --  88.5  PLT 544*  --  476*  --  452* 465*  --   --   --  389   < > = values in this interval not displayed.    Basic Metabolic Panel: Recent Labs  Lab 03/26/19 0529  03/27/19 0445  03/28/19 0450 03/29/19 0050 03/29/19 0259 03/29/19 1642 03/30/19 0102 03/30/19 0430  NA 141   < > 140   < > 137 136 136 138 136 137  K 4.4   < > 3.8   < > 4.0 4.0 3.9 3.6 3.6 3.3*  CL 102  --  94*  --  92* 88*  --   --   --  94*  CO2 30  --  33*  --  35* 33*  --   --   --  28  GLUCOSE 204*  --  263*  --  221* 319*  --   --   --  383*  BUN 28*  --  45*  --  55* 67*  --   --   --  93*  CREATININE 1.20  --  1.70*  --  1.70* 1.94*  --   --   --  1.84*  CALCIUM 7.9*  --  8.7*  --  8.9 9.1  --   --   --  7.7*  MG 1.9  --  1.8  --  1.7 1.7  --   --   --  2.1  PHOS  --   --  3.0  --  4.1 4.0  --   --   --  3.3   < > = values in this interval not displayed.   GFR: Estimated Creatinine Clearance: 53.8 mL/min (A) (by C-G formula based on SCr of 1.84 mg/dL (H)). Recent Labs  Lab 03/26/19 0529 03/27/19 0445 03/28/19 0450 03/29/19 0050 03/29/19 1407 03/30/19 0430  PROCALCITON 0.21 0.22  --   --  4.01 2.82  WBC 16.1* 11.8* 11.3* 14.7*  --  12.4*    Liver Function Tests: Recent Labs  Lab 03/26/19 0529 03/27/19 0445 03/28/19 0450 03/29/19 0050 03/30/19 0430  AST 27 39 34 34 31  ALT 17 21 24 24 24   ALKPHOS 90 82 73 73 60  BILITOT 0.2* 0.4 0.4 0.6 1.1  PROT 6.4* 6.8 7.2 8.0 7.1  ALBUMIN 2.3* 2.4* 2.5* 2.8* 2.4*   No results for input(s): LIPASE, AMYLASE in the last 168 hours. No results for input(s): AMMONIA in the last 168 hours.  ABG    Component Value Date/Time   PHART 7.429 03/30/2019 0102   PCO2ART 50.1 (H) 03/30/2019  0102   PO2ART 72.0 (L) 03/30/2019 0102   HCO3 32.8 (H) 03/30/2019 0102   TCO2 34 (H) 03/30/2019 0102   ACIDBASEDEF 3.0 (H) 03/27/2019 1442   O2SAT 93.0 03/30/2019  0102   Coagulation Profile: No results for input(s): INR, PROTIME in the last 168 hours.  Cardiac Enzymes: No results for input(s): CKTOTAL, CKMB, CKMBINDEX, TROPONINI in the last 168 hours.  HbA1C: Hgb A1c MFr Bld  Date/Time Value Ref Range Status  03/07/2019 01:00 PM 7.5 (H) 4.8 - 5.6 % Final    Comment:    (NOTE) Pre diabetes:          5.7%-6.4% Diabetes:              >6.4% Glycemic control for   <7.0% adults with diabetes    CBG: Recent Labs  Lab 03/29/19 1619 03/29/19 1944 03/29/19 2313 03/30/19 0329 03/30/19 0748  GLUCAP 331* 340* 393* 363* 352*   The patient is critically ill with multiple organ systems failure and requires high complexity decision making for assessment and support, frequent evaluation and titration of therapies, application of advanced monitoring technologies and extensive interpretation of multiple databases.   Critical Care Time devoted to patient care services described in this note is  32  Minutes. This time reflects time of care of this signee Dr Koren Bound. This critical care time does not reflect procedure time, or teaching time or supervisory time of PA/NP/Med student/Med Resident etc but could involve care discussion time.  Alyson Reedy, M.D. [facility].

## 2019-03-30 NOTE — Progress Notes (Signed)
PROGRESS NOTE  Angel RainRichard Craig Kertesz YQM:[id number] DOB: 05/17/1961 DOA: 10-Jun-2018  PCP: Lenox PondsSilva Zapata, Edwin, MD  Brief History/Interval Summary: 58 year old with a history of hyperlipidemia and DM 2 who began to develop upper respiratory symptoms on 10/5, and ultimately tested positive for Covid on 10/9. Records suggest he experienced a gradual progression of symptoms with acute worsening of shortness of breath on 10/25, which prompted [facility]. In [facility] he was noted to have a saturation of 63% on room air. Chest x-ray noted severe bilateral infiltrates. He rapidly decompensated in the emergency room. He failed a trial of BiPAP and had to be intubated. [facility]. At the time of my exam he is intubated and sedated.  Reason for Visit: Acute respiratory failure with hypoxia.  Pneumonia due to COVID-19  Consultants: Pulmonology  Procedures: [facility]  Antibiotics: Anti-infectives (From admission, onward)   Start     Dose/Rate Route Frequency Ordered Stop   03/30/19 1300  vancomycin (VANCOCIN) IVPB 1000 mg/200 mL premix     1,000 mg 200 mL/hr over 60 Minutes Intravenous Every 24 hours 03/29/19 1441     03/29/19 2000  piperacillin-tazobactam (ZOSYN) IVPB 3.375 g     3.375 g 12.5 mL/hr over 240 Minutes Intravenous Every 8 hours 03/29/19 1234     03/29/19 1300  piperacillin-tazobactam (ZOSYN) IVPB 3.375 g     3.375 g 100 mL/hr over 30 Minutes Intravenous  Once 03/29/19 1228 03/29/19 1340   03/29/19 1300  vancomycin (VANCOCIN) 2,000 mg in sodium chloride 0.9 % 500 mL IVPB     2,000 mg 250 mL/hr over 120 Minutes Intravenous  Once 03/29/19 1228 03/29/19 1506   03/26/19 1000  remdesivir 100 mg in sodium chloride 0.9 % 250 mL IVPB     100 mg 500 mL/hr over 30 Minutes Intravenous Every 24 hours August 07, 2018 1031 03/29/19 1034      Subjective/Interval History: Patient remains intubated  and sedated   Assessment/Plan:  Acute Hypoxic Resp. Failure/Pneumonia due to COVID-19  Vent Mode: PRVC FiO2 (%):  [55 %-60 %] 55 % Set Rate:  [20 bmp] 20 bmp Vt Set:  [600 mL] 600 mL PEEP:  [12 cmH20] 12 cmH20 Plateau Pressure:  [25 cmH20-29 cmH20] 29 cmH20     Component Value Date/Time   PHART 7.429 03/30/2019 0102   PCO2ART 50.1 (H) 03/30/2019 0102   PO2ART 72.0 (L) 03/30/2019 0102   HCO3 32.8 (H) 03/30/2019 0102   TCO2 34 (H) 03/30/2019 0102   ACIDBASEDEF 3.0 (H) 10-Jun-2018 1442   O2SAT 93.0 03/30/2019 0102    COVID-19 Labs  Recent Labs    03/28/19 0450 03/29/19 0050 03/30/19 0430  DDIMER 3.10* 2.61* 2.26*  FERRITIN 289 372* 651*  CRP 5.2* 3.8* 18.7*    Patient had positive COVID-19 result on 10/9.  [facility] or one of the clinics in that area.  Fever: Had significantly elevated temperatures most of the day yesterday.  Appears to be improving gradually.   Oxygen requirements: Mechanical ventilation.  55% FiO2.  Saturating in the early 90s.   Antibacterials: Vancomycin and Zosyn initiated 10/30 Remdesivir: Completed course on 10/30 Steroids: Dexamethasone 6 mg daily Diuretics: Diuretics have been given per PCCM Actemra: Not given Convalescent plasma: Transfused on 10/26 Vitamin C and Zinc: Continue DVT Prophylaxis:  Lovenox 55 mg every 12 hours  Patient remains intubated and sedated.  Pulmonology is following and  managing.  Chest x-ray has been showing slight improvement.  His FiO2 requirements were high yesterday.  Noted to be better this morning. Due to agitation patient was started on Seroquel.  He is also on Klonopin.  From a COVID-19 standpoint patient has completed course of remdesivir.  He remains on steroids.  He was given convalescent plasma.  His CRP noted to be significantly elevated at 18.7.  Due to concern for infection we will hold off on Actemra since he is now on antibiotics.  However we could consider increasing the dose  of steroids.  D-dimer 2.26.     Fever Temperatures have improved with cooling blanket.  Blood cultures were done along with respiratory cultures.  UA did not suggest infection.  Patient was started on broad-spectrum antibiotics with vancomycin and Zosyn.  Procalcitonin was elevated at 4 yesterday.  Improved to 2.82 today.  WBC slightly better.  Lower extremity Doppler studies pending.    Septic shock Was initially requiring Levophed which has been weaned off.  Continue to monitor.  Diabetes mellitus type 2, uncontrolled with hyperglycemia P A1c 7.5.  Elevated CBGs most likely due to steroids.  Poorly controlled.  He is currently on Lantus 35 units twice a day.  Since plan is to increase dose of dexamethasone we will transition him to IV insulin for better glycemic control.  Acute renal failure/hypokalemia Renal function is stable.  Elevated counts most likely due to diuresis.  Monitor urine output.  Negative fluid balance.  Replace potassium  Normocytic anemia No evidence of overt bleeding.  Continue to monitor hemoglobin.  Obesity Estimated body mass index is 32.9 kg/m as calculated from the following:   Height as of this encounter:  (1.803 m).   Weight as of this encounter: 107 kg.  FEN Not on IV fluids.  Monitor electrolytes.  He is getting tube feedings.  DVT Prophylaxis: On Lovenox adjusted for weight PUD Prophylaxis: Protonix once daily Code Status: Full code Family Communication: Wife being updated on a daily basis Disposition Plan: Remain in ICU for now   Medications:  Scheduled: . chlorhexidine  15 mL Mouth/Throat BID  . Chlorhexidine Gluconate Cloth  6 each Topical Daily  . clonazePAM  1 mg Per Tube BID  . dexamethasone (DECADRON) injection  6 mg Intravenous Q24H  . enoxaparin (LOVENOX) injection  55 mg Subcutaneous Q12H  . feeding supplement (PRO-STAT SUGAR FREE 64)  30 mL Per Tube BID  . insulin aspart  0-20 Units Subcutaneous Q4H  . insulin aspart  8  Units Subcutaneous Q4H  . insulin glargine  35 Units Subcutaneous BID  . mouth rinse  15 mL Mouth Rinse 10 times per day  . oxyCODONE  5 mg Per Tube Q6H  . pantoprazole sodium  40 mg Per Tube Daily  . potassium chloride  40 mEq Per Tube Once  . QUEtiapine  25 mg Oral BID  . vitamin C  500 mg Per Tube Daily  . zinc sulfate  220 mg Per Tube Daily   Continuous: . dexmedetomidine (PRECEDEX) IV infusion 1.1 mcg/kg/hr (03/30/19 0600)  . feeding supplement (VITAL AF 1.2 CAL) 70 mL/hr at 03/30/19 0600  . fentaNYL infusion INTRAVENOUS 200 mcg/hr (03/30/19 0600)  . norepinephrine (LEVOPHED) Adult infusion 4 mcg/min (03/30/19 0600)  . piperacillin-tazobactam (ZOSYN)  IV 3.375 g (03/30/19 0333)  . vancomycin     ZOX:[id number], bisacodyl, chlorpheniramine-HYDROcodone, fentaNYL, guaiFENesin-dextromethorphan, ondansetron **OR** ondansetron (ZOFRAN) IV, sennosides   Objective:  Vital Signs  Vitals:   03/30/19 0455  03/30/19 0500 03/30/19 0600 03/30/19 0700  BP:  100/65 94/66 107/69  Pulse: 100 100 95 (!) 102  Resp: (!) 26 (!) 26 (!) 29 (!) 28  Temp:  (!) 100.9 F (38.3 C) (!) 100.9 F (38.3 C) (!) 100.9 F (38.3 C)  TempSrc:  Esophageal    SpO2: 96% 94% 95% 94%  Weight:  107 kg    Height:        Intake/Output Summary (Last 24 hours) at 03/30/2019 0845 Last data filed at 03/30/2019 0600 Gross per 24 hour  Intake 3209.37 ml  Output 1385 ml  Net 1824.37 ml   Filed Weights   03/28/19 0500 03/29/19 0500 03/30/19 0500  Weight: 109 kg 106.2 kg 107 kg    General appearance: Intubated and sedated Resp: Coarse breath sounds bilaterally.  No wheezing rales or rhonchi. Cardio: S1-S2 is normal regular.  No S3-S4.  No rubs murmurs or bruit GI: Abdomen is soft.  Nontender nondistended.  Bowel sounds are present normal.  No masses organomegaly Extremities: No edema.  Full range of motion of lower extremities. Neurologic: He is sedated.  Pupils are equal.    Lab Results:  Data  Reviewed: I have personally reviewed following labs and imaging studies  CBC: Recent Labs  Lab 03/26/19 0529  03/27/19 0445  03/28/19 0450 03/29/19 0050 03/29/19 0259 03/29/19 1642 03/30/19 0102 03/30/19 0430  WBC 16.1*  --  11.8*  --  11.3* 14.7*  --   --   --  12.4*  NEUTROABS 13.0*  --  8.6*  --  7.3 11.0*  --   --   --  10.2*  HGB 9.8*   < > 10.0*   < > 10.3* 12.0* 13.3 12.9* 12.6* 12.3*  HCT 31.5*   < > 32.1*   < > 33.2* 38.8* 39.0 38.0* 37.0* 39.3  MCV 90.3  --  89.9  --  90.0 89.0  --   --   --  88.5  PLT 544*  --  476*  --  452* 465*  --   --   --  389   < > = values in this interval not displayed.    Basic Metabolic Panel: Recent Labs  Lab 03/26/19 0529  03/27/19 0445  03/28/19 0450 03/29/19 0050 03/29/19 0259 03/29/19 1642 03/30/19 0102 03/30/19 0430  NA 141   < > 140   < > 137 136 136 138 136 137  K 4.4   < > 3.8   < > 4.0 4.0 3.9 3.6 3.6 3.3*  CL 102  --  94*  --  92* 88*  --   --   --  94*  CO2 30  --  33*  --  35* 33*  --   --   --  28  GLUCOSE 204*  --  263*  --  221* 319*  --   --   --  383*  BUN 28*  --  45*  --  55* 67*  --   --   --  93*  CREATININE 1.20  --  1.70*  --  1.70* 1.94*  --   --   --  1.84*  CALCIUM 7.9*  --  8.7*  --  8.9 9.1  --   --   --  7.7*  MG 1.9  --  1.8  --  1.7 1.7  --   --   --  2.1  PHOS  --   --  3.0  --  4.1 4.0  --   --   --  3.3   < > = values in this interval not displayed.    GFR: Estimated Creatinine Clearance: 53.8 mL/min (A) (by C-G formula based on SCr of 1.84 mg/dL (H)).  Liver Function Tests: Recent Labs  Lab 03/26/19 0529 03/27/19 0445 03/28/19 0450 03/29/19 0050 03/30/19 0430  AST 27 39 34 34 31  ALT 17 21 24 24 24   ALKPHOS 90 82 73 73 60  BILITOT 0.2* 0.4 0.4 0.6 1.1  PROT 6.4* 6.8 7.2 8.0 7.1  ALBUMIN 2.3* 2.4* 2.5* 2.8* 2.4*    HbA1C: No results for input(s): HGBA1C in the last 72 hours.  CBG: Recent Labs  Lab 03/29/19 1619 03/29/19 1944 03/29/19 2313 03/30/19 0329 03/30/19 0748   GLUCAP 331* 340* 393* 363* 352*     Anemia Panel: Recent Labs    03/29/19 0050 03/30/19 0430  FERRITIN 372* 651*    Recent Results (from the past 240 hour(s))  MRSA PCR Screening     Status: None   Collection Time: 04/22/2019  8:57 PM   Specimen: Nasal Mucosa; Nasopharyngeal  Result Value Ref Range Status   MRSA by PCR NEGATIVE NEGATIVE Final    Comment:        The GeneXpert MRSA Assay (FDA approved for NASAL specimens only), is one component of a comprehensive MRSA colonization surveillance program. It is not intended to diagnose MRSA infection nor to guide or monitor treatment for MRSA infections. Performed at [facility], [address] [place], [place]   Culture, blood (routine x 2)     Status: None (Preliminary result)   Collection Time: 03/28/19 11:02 PM   Specimen: BLOOD  Result Value Ref Range Status   Specimen Description   Final    BLOOD RIGHT ANTECUBITAL Performed at [facility], [address], [place], [place]    Special Requests   Final    BOTTLES DRAWN AEROBIC AND ANAEROBIC Blood Culture adequate volume Performed at [facility], [address] [place], [place]    Culture   Final    NO GROWTH < 12 HOURS Performed at [facility], [address] [place], [place]    Report Status PENDING  Incomplete  Culture, blood (routine x 2)     Status: None (Preliminary result)   Collection Time: 03/28/19 11:40 PM   Specimen: BLOOD LEFT HAND  Result Value Ref Range Status   Specimen Description   Final    BLOOD LEFT HAND Performed at [facility], [address] [place], [place]    Special Requests   Final    BOTTLES DRAWN AEROBIC AND ANAEROBIC Blood Culture adequate volume Performed at [facility], [address] [place], [place]    Culture   Final    NO GROWTH < 12 HOURS Performed at  [facility], [address] [place], [place]    Report Status PENDING  Incomplete  Expectorated sputum assessment w rflx to resp cult     Status: None   Collection Time: 03/29/19 12:50 AM   Specimen: Expectorated Sputum  Result Value Ref Range Status   Specimen Description EXPECTORATED SPUTUM  Final   Special Requests Immunocompromised  Final   Sputum evaluation   Final    THIS SPECIMEN IS ACCEPTABLE FOR SPUTUM CULTURE Performed at [facility], [address]  [place], [place] [address]    Report Status 03/29/2019 FINAL  Final  Culture, respiratory     Status: None (Preliminary result)   Collection Time: 03/29/19 12:50 AM  Result Value Ref Range Status   Specimen Description   Final    EXPECTORATED SPUTUM Performed at [facility], [address] [place], [place] [address]    Special Requests   Final    Immunocompromised Reflexed from [id number] Performed at [facility], [address] [place], [place] [address]    Gram Stain   Final    MODERATE WBC PRESENT, PREDOMINANTLY PMN ABUNDANT GRAM NEGATIVE RODS MODERATE GRAM POSITIVE COCCI Performed at [facility], [address], [place], [place] [address]    Culture PENDING  Incomplete   Report Status PENDING  Incomplete      Radiology Studies: Dg Chest Port 1 View  Result Date: 03/29/2019 CLINICAL DATA:  Endotracheal tube placement. EXAM: PORTABLE CHEST 1 VIEW COMPARISON:  03/28/2019 FINDINGS: Endotracheal tube has tip 5.3 cm above the carina. Left subclavian central venous catheter unchanged. Enteric tube courses into the region of the stomach and off the film as tip is not visualized. Lungs are hypoinflated and demonstrate bibasilar airspace opacification right worse than left with slight interval improved aeration. No effusion. Mild prominence of the perihilar vessels. Cardiomediastinal silhouette and remainder of the exam is unchanged. IMPRESSION:  Continued bibasilar opacification right worse than left with slight interval improvement. Possible mild vascular congestion. Tubes and lines as described. Electronically Signed   By: Elberta Fortis M.D.   On: 03/29/2019 07:26       LOS: 5 days   Nilan Iddings Rito Ehrlich  [facility] Pager on [email or web address]  03/30/2019, 8:45 AM

## 2019-03-30 NOTE — Progress Notes (Signed)
Spoke with wife Melissa. Informed her of pt's current medical condition. Informed her that pt was currently on a medication to support BP and that the pt was still fevering. Setup a time for a video call later in the day

## 2019-03-31 ENCOUNTER — Inpatient Hospital Stay (HOSPITAL_COMMUNITY): Payer: PRIVATE HEALTH INSURANCE

## 2019-03-31 ENCOUNTER — Encounter (HOSPITAL_COMMUNITY): Payer: PRIVATE HEALTH INSURANCE

## 2019-03-31 DIAGNOSIS — M7989 Other specified soft tissue disorders: Secondary | ICD-10-CM

## 2019-03-31 LAB — CBC
HCT: 41.2 % (ref 39.0–52.0)
Hemoglobin: 12.8 g/dL — ABNORMAL LOW (ref 13.0–17.0)
MCH: 27.9 pg (ref 26.0–34.0)
MCHC: 31.1 g/dL (ref 30.0–36.0)
MCV: 90 fL (ref 80.0–100.0)
Platelets: 375 10*3/uL (ref 150–400)
RBC: 4.58 MIL/uL (ref 4.22–5.81)
RDW: 14 % (ref 11.5–15.5)
WBC: 14.5 10*3/uL — ABNORMAL HIGH (ref 4.0–10.5)
nRBC: 0.8 % — ABNORMAL HIGH (ref 0.0–0.2)

## 2019-03-31 LAB — D-DIMER, QUANTITATIVE: D-Dimer, Quant: 2.34 ug/mL-FEU — ABNORMAL HIGH (ref 0.00–0.50)

## 2019-03-31 LAB — POCT I-STAT 7, (LYTES, BLD GAS, ICA,H+H)
Acid-Base Excess: 7 mmol/L — ABNORMAL HIGH (ref 0.0–2.0)
Bicarbonate: 32.2 mmol/L — ABNORMAL HIGH (ref 20.0–28.0)
Calcium, Ion: 1.12 mmol/L — ABNORMAL LOW (ref 1.15–1.40)
HCT: 38 % — ABNORMAL LOW (ref 39.0–52.0)
Hemoglobin: 12.9 g/dL — ABNORMAL LOW (ref 13.0–17.0)
O2 Saturation: 94 %
Patient temperature: 38.2
Potassium: 3.9 mmol/L (ref 3.5–5.1)
Sodium: 144 mmol/L (ref 135–145)
TCO2: 34 mmol/L — ABNORMAL HIGH (ref 22–32)
pCO2 arterial: 47.1 mmHg (ref 32.0–48.0)
pH, Arterial: 7.447 (ref 7.350–7.450)
pO2, Arterial: 71 mmHg — ABNORMAL LOW (ref 83.0–108.0)

## 2019-03-31 LAB — GLUCOSE, CAPILLARY
Glucose-Capillary: 151 mg/dL — ABNORMAL HIGH (ref 70–99)
Glucose-Capillary: 154 mg/dL — ABNORMAL HIGH (ref 70–99)
Glucose-Capillary: 162 mg/dL — ABNORMAL HIGH (ref 70–99)
Glucose-Capillary: 180 mg/dL — ABNORMAL HIGH (ref 70–99)
Glucose-Capillary: 183 mg/dL — ABNORMAL HIGH (ref 70–99)
Glucose-Capillary: 184 mg/dL — ABNORMAL HIGH (ref 70–99)
Glucose-Capillary: 211 mg/dL — ABNORMAL HIGH (ref 70–99)
Glucose-Capillary: 223 mg/dL — ABNORMAL HIGH (ref 70–99)
Glucose-Capillary: 237 mg/dL — ABNORMAL HIGH (ref 70–99)
Glucose-Capillary: 252 mg/dL — ABNORMAL HIGH (ref 70–99)
Glucose-Capillary: 268 mg/dL — ABNORMAL HIGH (ref 70–99)
Glucose-Capillary: 281 mg/dL — ABNORMAL HIGH (ref 70–99)
Glucose-Capillary: 302 mg/dL — ABNORMAL HIGH (ref 70–99)
Glucose-Capillary: 309 mg/dL — ABNORMAL HIGH (ref 70–99)
Glucose-Capillary: 311 mg/dL — ABNORMAL HIGH (ref 70–99)
Glucose-Capillary: 313 mg/dL — ABNORMAL HIGH (ref 70–99)
Glucose-Capillary: 317 mg/dL — ABNORMAL HIGH (ref 70–99)
Glucose-Capillary: 325 mg/dL — ABNORMAL HIGH (ref 70–99)

## 2019-03-31 LAB — BASIC METABOLIC PANEL
Anion gap: 11 (ref 5–15)
BUN: 91 mg/dL — ABNORMAL HIGH (ref 6–20)
CO2: 31 mmol/L (ref 22–32)
Calcium: 8.2 mg/dL — ABNORMAL LOW (ref 8.9–10.3)
Chloride: 103 mmol/L (ref 98–111)
Creatinine, Ser: 1.38 mg/dL — ABNORMAL HIGH (ref 0.61–1.24)
GFR calc Af Amer: >60 mL/min (ref >60)
GFR calc non Af Amer: 56 mL/min — ABNORMAL LOW (ref >60)
Glucose, Bld: 193 mg/dL — ABNORMAL HIGH (ref 70–99)
Potassium: 4 mmol/L (ref 3.5–5.1)
Sodium: 145 mmol/L (ref 135–145)

## 2019-03-31 LAB — C-REACTIVE PROTEIN: CRP: 13 mg/dL — ABNORMAL HIGH (ref <1.0)

## 2019-03-31 LAB — PROCALCITONIN: Procalcitonin: 1.33 ng/mL

## 2019-03-31 LAB — PHOSPHORUS: Phosphorus: 2 mg/dL — ABNORMAL LOW (ref 2.5–4.6)

## 2019-03-31 LAB — MAGNESIUM: Magnesium: 2.6 mg/dL — ABNORMAL HIGH (ref 1.7–2.4)

## 2019-03-31 MED ORDER — METOLAZONE 5 MG PO TABS
10.0000 mg | ORAL_TABLET | Freq: Once | ORAL | Status: AC
  Administered 2019-03-31: 10 mg via ORAL
  Filled 2019-03-31: qty 2

## 2019-03-31 MED ORDER — FUROSEMIDE 10 MG/ML IJ SOLN
40.0000 mg | Freq: Four times a day (QID) | INTRAMUSCULAR | Status: AC
  Administered 2019-03-31 – 2019-04-01 (×3): 40 mg via INTRAVENOUS
  Filled 2019-03-31 (×3): qty 4

## 2019-03-31 NOTE — Progress Notes (Signed)
Called and updated patient's spouse Melissa. All questions answered and reviewed current medications and ventilator settings.

## 2019-03-31 NOTE — Progress Notes (Signed)
Wife called and updated on patient status and plan of care. Dicussed ventilator settings, sedation, insulin coverage, steroids and diuretics . No further questions at this time.

## 2019-03-31 NOTE — Progress Notes (Signed)
Bilateral lower extremity venous duplex has been completed. Preliminary results can be found in CV Proc through chart review.   03/31/19 2:16 PM Carlos Levering RVT

## 2019-03-31 NOTE — Progress Notes (Signed)
PROGRESS NOTE  Angel Stuart:[id number] DOB: [birth year] DOA: 03/19/2019  PCP: Lenox Ponds, MD  Brief History/Interval Summary: 58 year old with a history of hyperlipidemia and DM 2 who began to develop upper respiratory symptoms on 10/5, and ultimately tested positive for Covid on 10/9. Records suggest he experienced a gradual progression of symptoms with acute worsening of shortness of breath on 10/25, which prompted him to present to the [facility] ED. In the [facility] ED he was noted to have a saturation of 63% on room air. Chest x-ray noted severe bilateral infiltrates. He rapidly decompensated in the emergency room. He failed a trial of BiPAP and had to be intubated. He was subsequently transferred to [facility] to the ICU. At the time of my exam he is intubated and sedated.  Reason for Visit: Acute respiratory failure with hypoxia.  Pneumonia due to COVID-19  Consultants: Pulmonology  Procedures: Intubated at [facility] ED  Antibiotics: Anti-infectives (From admission, onward)   Start     Dose/Rate Route Frequency Ordered Stop   03/30/19 1300  vancomycin (VANCOCIN) IVPB 1000 mg/200 mL premix  Status:  Discontinued     1,000 mg 200 mL/hr over 60 Minutes Intravenous Every 24 hours 03/29/19 1441 03/31/19 1027   03/29/19 2000  piperacillin-tazobactam (ZOSYN) IVPB 3.375 g     3.375 g 12.5 mL/hr over 240 Minutes Intravenous Every 8 hours 03/29/19 1234     03/29/19 1300  piperacillin-tazobactam (ZOSYN) IVPB 3.375 g     3.375 g 100 mL/hr over 30 Minutes Intravenous  Once 03/29/19 1228 03/29/19 1340   03/29/19 1300  vancomycin (VANCOCIN) 2,000 mg in sodium chloride 0.9 % 500 mL IVPB     2,000 mg 250 mL/hr over 120 Minutes Intravenous  Once 03/29/19 1228 03/29/19 1506   03/26/19 1000  remdesivir 100 mg in sodium chloride 0.9 % 250 mL IVPB     100 mg 500 mL/hr over 30 Minutes Intravenous Every 24 hours 03/01/2019 1031 03/29/19 1034      Subjective/Interval  History: Patient remains intubated and sedated.   Assessment/Plan:  Acute Hypoxic Resp. Failure/Pneumonia due to COVID-19  Vent Mode: PRVC FiO2 (%):  [40 %-50 %] 40 % Set Rate:  [20 bmp] 20 bmp Vt Set:  [600 mL] 600 mL PEEP:  [8 cmH20] 8 cmH20 Plateau Pressure:  [16 cmH20-24 cmH20] 18 cmH20     Component Value Date/Time   PHART 7.447 03/31/2019 0410   PCO2ART 47.1 03/31/2019 0410   PO2ART 71.0 (L) 03/31/2019 0410   HCO3 32.2 (H) 03/31/2019 0410   TCO2 34 (H) 03/31/2019 0410   ACIDBASEDEF 3.0 (H) 03/07/2019 1442   O2SAT 94.0 03/31/2019 0410    COVID-19 Labs  Recent Labs    03/29/19 0050 03/30/19 0430 03/31/19 0500  DDIMER 2.61* 2.26* 2.34*  FERRITIN 372* 651*  --   CRP 3.8* 18.7* 13.0*    Patient had positive COVID-19 result on 10/9.  This was either at [facility] or one of the clinics in that area.  Fever: Patient continues to have fever on and off.   Oxygen requirements: Mechanical ventilation.  50% FiO2.  Saturating in the 90s.    Antibacterials: Vancomycin and Zosyn initiated 10/30 Remdesivir: Completed course on 10/30 Steroids: Dexamethasone 6 mg twice daily Diuretics: Lasix ordered today by CCM Actemra: Not given Convalescent plasma: Transfused on 10/26 Vitamin C and Zinc: Continue DVT Prophylaxis:  Lovenox 55 mg every 12 hours  Patient remains intubated and sedated.  Pulmonology is following.  Chest x-ray  had showed slight improvement.  FiO2 requirement appears to be improving.  Due to agitation patient was started on Seroquel.  He is also on Klonopin.  From a COVID-19 standpoint he has completed course of remdesivir.  He has been on steroids.  Due to a significant bump in his CRP yesterday the dose of dexamethasone was increased to twice a day.  Patient received convalescent plasma as well.  No Actemra due to elevated procalcitonin.  D-dimer stable.  Fever Patient continues to be febrile.  He is on a cooling blanket.  Blood cultures negative so far.   Tracheal aspirate cultures growing Pseudomonas.  Will discuss with pulmonology regarding antibiotic treatment.  Could consider stopping vancomycin.  UA did not suggest infection.  Lower extremity Doppler study is still pending.  Procalcitonin was elevated at 4 and then improved to 2.8.  Today it is 1.33.    Septic shock Was initially requiring Levophed which has been weaned off.  Continue to monitor.  Diabetes mellitus type 2, uncontrolled with hyperglycemia A1c 7.5.  Elevated CBGs most likely due to steroids.  Fully controlled and since we were increasing the dose of his steroids he was transitioned to IV insulin.  Seems to be better controlled now.  Continue to monitor.   Acute renal failure/hypokalemia Renal function is stable.  Monitor urine output.  Maintain negative fluid balance.  Potassium is normal today.    Normocytic anemia No evidence of overt bleeding.  Continue to monitor hemoglobin.  Obesity Estimated body mass index is 32.99 kg/m as calculated from the following:   Height as of this encounter:  (1.803 m).   Weight as of this encounter: 107.3 kg.  FEN Not on IV fluids.  Monitor electrolytes.  He is getting tube feedings.  DVT Prophylaxis: On Lovenox adjusted for weight PUD Prophylaxis: Protonix once daily Code Status: Full code Family Communication: Wife being updated on a daily basis Disposition Plan: Remain in ICU for now   Medications:  Scheduled: . chlorhexidine  15 mL Mouth/Throat BID  . Chlorhexidine Gluconate Cloth  6 each Topical Daily  . clonazePAM  1 mg Per Tube BID  . dexamethasone (DECADRON) injection  6 mg Intravenous Q12H  . enoxaparin (LOVENOX) injection  55 mg Subcutaneous Q12H  . feeding supplement (PRO-STAT SUGAR FREE 64)  30 mL Per Tube BID  . furosemide  40 mg Intravenous Q6H  . mouth rinse  15 mL Mouth Rinse 10 times per day  . oxyCODONE  5 mg Per Tube Q6H  . pantoprazole sodium  40 mg Per Tube Daily  . QUEtiapine  25 mg Oral BID   . vitamin C  500 mg Per Tube Daily  . zinc sulfate  220 mg Per Tube Daily   Continuous: . dexmedetomidine (PRECEDEX) IV infusion 1.2 mcg/kg/hr (03/31/19 1100)  . feeding supplement (VITAL AF 1.2 CAL) 1,000 mL (03/31/19 0135)  . fentaNYL infusion INTRAVENOUS 200 mcg/hr (03/31/19 1100)  . insulin 14.5 mL/hr at 03/31/19 1100  . norepinephrine (LEVOPHED) Adult infusion 2 mcg/min (03/31/19 1100)  . piperacillin-tazobactam (ZOSYN)  IV 3.375 g (03/31/19 0353)   ZOX:[id number], bisacodyl, chlorpheniramine-HYDROcodone, fentaNYL, guaiFENesin-dextromethorphan, ondansetron **OR** ondansetron (ZOFRAN) IV, sennosides   Objective:  Vital Signs  Vitals:   03/31/19 1000 03/31/19 1100 03/31/19 1115 03/31/19 1120  BP: (!) 89/62 91/66 106/74   Pulse: 95 92  (!) 101  Resp: (!) 28 (!) 32  (!) 34  Temp: (!) 100.8 F (38.2 C) (!) 102.2 F (39 C)  (!) 102  F (38.9 C)  TempSrc:    Bladder  SpO2: 97% 98%  95%  Weight:      Height:        Intake/Output Summary (Last 24 hours) at 03/31/2019 1136 Last data filed at 03/31/2019 1100 Gross per 24 hour  Intake 3674.73 ml  Output 5535 ml  Net -1860.27 ml   Filed Weights   03/29/19 0500 03/30/19 0500 03/31/19 0500  Weight: 106.2 kg 107 kg 107.3 kg    General appearance: Intubated and sedated Resp: Coarse breath sounds bilaterally.  Crackles at the bases.  No wheezing or rhonchi.   Cardio: S1-S2 is normal regular.  No S3-S4.  No rubs murmurs or bruit GI: Abdomen is soft.  Nontender nondistended.  Bowel sounds are present normal.  No masses organomegaly Extremities: Minimal edema bilateral lower extremity Neurologic: Sedated     Lab Results:  Data Reviewed: I have personally reviewed following labs and imaging studies  CBC: Recent Labs  Lab 03/26/19 0529  03/27/19 0445  03/28/19 0450 03/29/19 0050  03/29/19 1642 03/30/19 0102 03/30/19 0430 03/31/19 0410 03/31/19 0500  WBC 16.1*  --  11.8*  --  11.3* 14.7*  --   --   --  12.4*  --   14.5*  NEUTROABS 13.0*  --  8.6*  --  7.3 11.0*  --   --   --  10.2*  --   --   HGB 9.8*   < > 10.0*   < > 10.3* 12.0*   < > 12.9* 12.6* 12.3* 12.9* 12.8*  HCT 31.5*   < > 32.1*   < > 33.2* 38.8*   < > 38.0* 37.0* 39.3 38.0* 41.2  MCV 90.3  --  89.9  --  90.0 89.0  --   --   --  88.5  --  90.0  PLT 544*  --  476*  --  452* 465*  --   --   --  389  --  375   < > = values in this interval not displayed.    Basic Metabolic Panel: Recent Labs  Lab 03/27/19 0445  03/28/19 0450 03/29/19 0050  03/29/19 1642 03/30/19 0102 03/30/19 0430 03/31/19 0410 03/31/19 0500  NA 140   < > 137 136   < > 138 136 137 144 145  K 3.8   < > 4.0 4.0   < > 3.6 3.6 3.3* 3.9 4.0  CL 94*  --  92* 88*  --   --   --  94*  --  103  CO2 33*  --  35* 33*  --   --   --  28  --  31  GLUCOSE 263*  --  221* 319*  --   --   --  383*  --  193*  BUN 45*  --  55* 67*  --   --   --  93*  --  91*  CREATININE 1.70*  --  1.70* 1.94*  --   --   --  1.84*  --  1.38*  CALCIUM 8.7*  --  8.9 9.1  --   --   --  7.7*  --  8.2*  MG 1.8  --  1.7 1.7  --   --   --  2.1  --  2.6*  PHOS 3.0  --  4.1 4.0  --   --   --  3.3  --  2.0*   < > = values in  this interval not displayed.    GFR: Estimated Creatinine Clearance: 71.8 mL/min (A) (by C-G formula based on SCr of 1.38 mg/dL (H)).  Liver Function Tests: Recent Labs  Lab 03/26/19 0529 03/27/19 0445 03/28/19 0450 03/29/19 0050 03/30/19 0430  AST 27 39 34 34 31  ALT 17 21 24 24 24   ALKPHOS 90 82 73 73 60  BILITOT 0.2* 0.4 0.4 0.6 1.1  PROT 6.4* 6.8 7.2 8.0 7.1  ALBUMIN 2.3* 2.4* 2.5* 2.8* 2.4*    HbA1C: No results for input(s): HGBA1C in the last 72 hours.  CBG: Recent Labs  Lab 03/31/19 0424 03/31/19 0626 03/31/19 0839 03/31/19 0931 03/31/19 1033  GLUCAP 183* 223* 311* 325* 302*     Anemia Panel: Recent Labs    03/29/19 0050 03/30/19 0430  FERRITIN 372* 651*    Recent Results (from the past 240 hour(s))  MRSA PCR Screening     Status: None    Collection Time: 03/24/2019  8:57 PM   Specimen: Nasal Mucosa; Nasopharyngeal  Result Value Ref Range Status   MRSA by PCR NEGATIVE NEGATIVE Final    Comment:        The GeneXpert MRSA Assay (FDA approved for NASAL specimens only), is one component of a comprehensive MRSA colonization surveillance program. It is not intended to diagnose MRSA infection nor to guide or monitor treatment for MRSA infections. Performed at [facility], [address] [place], [place] [address]   Culture, blood (routine x 2)     Status: None (Preliminary result)   Collection Time: 03/28/19 11:02 PM   Specimen: BLOOD  Result Value Ref Range Status   Specimen Description   Final    BLOOD RIGHT ANTECUBITAL Performed at [facility], [address] [place], [place] [address]    Special Requests   Final    BOTTLES DRAWN AEROBIC AND ANAEROBIC Blood Culture adequate volume Performed at [facility], [address] [place], [place] [address]    Culture   Final    NO GROWTH 2 DAYS Performed at [facility], [address] [place], [place] [address]    Report Status PENDING  Incomplete  Culture, blood (routine x 2)     Status: None (Preliminary result)   Collection Time: 03/28/19 11:40 PM   Specimen: BLOOD LEFT HAND  Result Value Ref Range Status   Specimen Description   Final    BLOOD LEFT HAND Performed at [facility], [address] [place], [place] [address]    Special Requests   Final    BOTTLES DRAWN AEROBIC AND ANAEROBIC Blood Culture adequate volume Performed at [facility], [address] [place], [place] [address]    Culture   Final    NO GROWTH 2 DAYS Performed at [facility], [address] [place], [place] [address]    Report Status PENDING  Incomplete  Expectorated sputum assessment w rflx to resp cult     Status: None   Collection Time: 03/29/19 12:50 AM   Specimen:  Expectorated Sputum  Result Value Ref Range Status   Specimen Description EXPECTORATED SPUTUM  Final   Special Requests Immunocompromised  Final   Sputum evaluation   Final    THIS SPECIMEN IS ACCEPTABLE FOR SPUTUM CULTURE Performed at [facility], [address] [place], [place] [address]    Report Status 03/29/2019 FINAL  Final  Culture, respiratory     Status: None (  Preliminary result)   Collection Time: 03/29/19 12:50 AM  Result Value Ref Range Status   Specimen Description   Final    EXPECTORATED SPUTUM Performed at [facility], [address], [place], [place] [address]    Special Requests   Final    Immunocompromised Reflexed from [id number] Performed at [facility], [address], [place], [place] [address]    Gram Stain   Final    MODERATE WBC PRESENT, PREDOMINANTLY PMN ABUNDANT GRAM NEGATIVE RODS MODERATE GRAM POSITIVE COCCI    Culture   Final    MODERATE PSEUDOMONAS AERUGINOSA SUSCEPTIBILITIES TO FOLLOW Performed at [facility], [address] [place], [place] [address]    Report Status PENDING  Incomplete      Radiology Studies: Dg Chest Port 1 View  Result Date: 03/31/2019 CLINICAL DATA:  Intubated. COVID-19 positive. EXAM: PORTABLE CHEST 1 VIEW COMPARISON:  03/30/2019 FINDINGS: Endotracheal tube in satisfactory position. Feeding tube extending into the stomach. Borderline enlarged cardiac silhouette. Mild increase in patchy and interstitial opacities in both lungs. No definite pleural fluid. No acute bony abnormality. IMPRESSION: Mildly progressive bilateral pneumonia or pneumonitis. Electronically Signed   By: Beckie Salts M.D.   On: 03/31/2019 11:12   Dg Chest Port 1 View  Result Date: 03/30/2019 CLINICAL DATA:  Respiratory failure secondary to COVID-19 pneumonia. EXAM: PORTABLE CHEST 1 VIEW COMPARISON:  03/29/2019 FINDINGS: Stable heart size. Endotracheal tube tip approximately 4 cm above the  carina. Left-sided central line tip in upper SVC. Feeding tube extends into the stomach. Aeration slightly improved with improved bilateral lung volumes. Persistent bilateral infiltrates predominantly in the lower and peripheral lung zones. No pneumothorax or significant pleural effusions. IMPRESSION: Slightly improved aeration bilaterally. Persistent bilateral infiltrates are predominantly in the lower and peripheral lung zones. Electronically Signed   By: Irish Lack M.D.   On: 03/30/2019 09:20       LOS: 6 days   Stepfon Rawles Rito Ehrlich  [facility] Pager on [email or web address]  03/31/2019, 11:36 AM

## 2019-03-31 NOTE — Progress Notes (Signed)
NAME:  Angel Stuart, MRN:  [id number], DOB:  06/22/1960, LOS: 6 ADMISSION DATE:  03/17/2019, CONSULTATION DATE:  10/26 REFERRING MD:  Sharon SellerMcClung, CHIEF COMPLAINT:  Dyspnea   Brief History   58 y/o male admitted on 10/26 in setting of ARDS from COVID 19 pneumonia, [facility].  History of present illness   This is a 58 y/o male with HTN, HLD, DM2 who was [facility]26 in the setting of ARDS from COVID 19 pneumonia.  He went to [facility] where he was noted to have an O2 saturation of 53% on RA.  He was briefly treated with BIPAP, then intubated.  He was intubated on arrival here so I could not obtain a history from him.  History was obtained by reviewing records [facility].  Past Medical History  HTN HLD DM2  Significant Hospital Events   10/26 admission to ICU  Consults:  PCCM  Procedures:  10/26 ETT >  10/26 L subclavian CVL >   Significant Diagnostic Tests:    Micro Data:  10/26 SARS COV 2 [facility] Salvia([place]) > positive Blood 10/30>>> Urine 10/30>>> Sputum 10/30>>>  Antimicrobials:  10/26 decadron>  10/26 remdesivir >  Vancomycin 10/30>>> Zosyn 10/30>>>  Interim history/subjective:  Remains febrile overnight Continues to require pressors  Objective   Blood pressure 129/81, pulse 94, temperature 100.2 F (37.9 C), resp. rate 16, height 5\' 11"  (1.803 m), weight 107.3 kg, SpO2 100 %.    Vent Mode: PRVC FiO2 (%):  [50 %-55 %] 50 % Set Rate:  [20 bmp] 20 bmp Vt Set:  [600 mL] 600 mL PEEP:  [8 cmH20-12 cmH20] 8 cmH20 Plateau Pressure:  [16 cmH20-24 cmH20] 18 cmH20   Intake/Output Summary (Last 24 hours) at 03/31/2019 0846 Last data filed at 03/31/2019 0600 Gross per 24 hour  Intake 3373.57 ml  Output 5035 ml  Net -1661.43 ml   Filed Weights   03/29/19 0500 03/30/19 0500 03/31/19 0500  Weight: 106.2 kg 107 kg 107.3 kg   Examination:  General:  Acutely ill appearing male, NAD HENT: [place]/AT, PERRL,  EOM-spontaneous, ETT in place PULM: Diffuse crackles CV: RRR, Nl S1/S2 and -M/R/G GI: Soft, NT, ND and +BS MSK: Anasarca  Neuro: Sedate, not following commands Skin: Intact  I reviewed CXR myself, ETT is in a good position and infiltrate noted  Discussed with TRH-MD  Resolved Hospital Problem list   N/A  Assessment & Plan:  ARDS due to COVID 19 pneumonia: severe hypoxemia, worsening hypercarbia Continue mechanical ventilation per ARDS protocol Target TVol 6-8cc/kgIBW Target Plateau Pressure < 30cm H20 Target driving pressure less than 15 cm of water Target PaO2 55-65: titrate PEEP/FiO2 per protocol As long as PaO2 to FiO2 ratio is less than 1:150 position in prone position for 16 hours a day Follow CVP with target of 4 Target today if 40 and 8 D/Ced proning D/Ced versed drip and change to PRN pushes Aggressive diureses today as BP allows  Continue oral sedation: oxycodone, clonazepam D/Ced paralytic orderset, no longer needing proning Change RASS score to -2 as target D/Ced propofol Precedex Fentanyl drip  PCCM will continue to follow  Best practice:  Diet: start tube feeding Pain/Anxiety/Delirium protocol (if indicated): as above, continuous paralytic protocol VAP protocol (if indicated): yes DVT prophylaxis: lovenox per protocol, discuss COVID PACT with the patient's family GI prophylaxis: pantoprazole Glucose control: SSI Mobility: bed rest Code Status: full Family [facility] Disposition: remain in ICU  Labs   CBC: Recent Labs  Lab 03/26/19 0529  03/27/19 0445  03/28/19 0450 03/29/19 0050  03/29/19 1642 03/30/19 0102 03/30/19 0430 03/31/19 0410 03/31/19 0500  WBC 16.1*  --  11.8*  --  11.3* 14.7*  --   --   --  12.4*  --  14.5*  NEUTROABS 13.0*  --  8.6*  --  7.3 11.0*  --   --   --  10.2*  --   --   HGB 9.8*   < > 10.0*   < > 10.3* 12.0*   < > 12.9* 12.6* 12.3* 12.9* 12.8*  HCT 31.5*   < > 32.1*   < > 33.2* 38.8*   < > 38.0* 37.0* 39.3  38.0* 41.2  MCV 90.3  --  89.9  --  90.0 89.0  --   --   --  88.5  --  90.0  PLT 544*  --  476*  --  452* 465*  --   --   --  389  --  375   < > = values in this interval not displayed.    Basic Metabolic Panel: Recent Labs  Lab 03/27/19 0445  03/28/19 0450 03/29/19 0050  03/29/19 1642 03/30/19 0102 03/30/19 0430 03/31/19 0410 03/31/19 0500  NA 140   < > 137 136   < > 138 136 137 144 145  K 3.8   < > 4.0 4.0   < > 3.6 3.6 3.3* 3.9 4.0  CL 94*  --  92* 88*  --   --   --  94*  --  103  CO2 33*  --  35* 33*  --   --   --  28  --  31  GLUCOSE 263*  --  221* 319*  --   --   --  383*  --  193*  BUN 45*  --  55* 67*  --   --   --  93*  --  91*  CREATININE 1.70*  --  1.70* 1.94*  --   --   --  1.84*  --  1.38*  CALCIUM 8.7*  --  8.9 9.1  --   --   --  7.7*  --  8.2*  MG 1.8  --  1.7 1.7  --   --   --  2.1  --  2.6*  PHOS 3.0  --  4.1 4.0  --   --   --  3.3  --  2.0*   < > = values in this interval not displayed.   GFR: Estimated Creatinine Clearance: 71.8 mL/min (A) (by C-G formula based on SCr of 1.38 mg/dL (H)). Recent Labs  Lab 03/27/19 0445 03/28/19 0450 03/29/19 0050 03/29/19 1407 03/30/19 0430 03/31/19 0500  PROCALCITON 0.22  --   --  4.01 2.82 1.33  WBC 11.8* 11.3* 14.7*  --  12.4* 14.5*    Liver Function Tests: Recent Labs  Lab 03/26/19 0529 03/27/19 0445 03/28/19 0450 03/29/19 0050 03/30/19 0430  AST 27 39 34 34 31  ALT 17 21 24 24 24   ALKPHOS 90 82 73 73 60  BILITOT 0.2* 0.4 0.4 0.6 1.1  PROT 6.4* 6.8 7.2 8.0 7.1  ALBUMIN 2.3* 2.4* 2.5* 2.8* 2.4*   No results for input(s): LIPASE, AMYLASE in the last 168 hours. No results for input(s): AMMONIA in the last 168 hours.  ABG    Component Value Date/Time   PHART 7.447 03/31/2019 0410  PCO2ART 47.1 03/31/2019 0410   PO2ART 71.0 (L) 03/31/2019 0410   HCO3 32.2 (H) 03/31/2019 0410   TCO2 34 (H) 03/31/2019 0410   ACIDBASEDEF 3.0 (H) 03/02/2019 1442   O2SAT 94.0 03/31/2019 0410   Coagulation Profile:  No results for input(s): INR, PROTIME in the last 168 hours.  Cardiac Enzymes: No results for input(s): CKTOTAL, CKMB, CKMBINDEX, TROPONINI in the last 168 hours.  HbA1C: Hgb A1c MFr Bld  Date/Time Value Ref Range Status  03/08/2019 01:00 PM 7.5 (H) 4.8 - 5.6 % Final    Comment:    (NOTE) Pre diabetes:          5.7%-6.4% Diabetes:              >6.4% Glycemic control for   <7.0% adults with diabetes    CBG: Recent Labs  Lab 03/31/19 0112 03/31/19 0218 03/31/19 0323 03/31/19 0424 03/31/19 0626  GLUCAP 313* 268* 237* 183* 223*   The patient is critically ill with multiple organ systems failure and requires high complexity decision making for assessment and support, frequent evaluation and titration of therapies, application of advanced monitoring technologies and extensive interpretation of multiple databases.   Critical Care Time devoted to patient care services described in this note is  32  Minutes. This time reflects time of care of this signee Dr Koren Bound. This critical care time does not reflect procedure time, or teaching time or supervisory time of PA/NP/Med student/Med Resident etc but could involve care discussion time.  Alyson Reedy, M.D. [facility] Pulmonary/Critical Care Medicine.

## 2019-03-31 DEATH — deceased

## 2019-04-01 ENCOUNTER — Inpatient Hospital Stay (HOSPITAL_COMMUNITY): Payer: PRIVATE HEALTH INSURANCE

## 2019-04-01 DIAGNOSIS — J810 Acute pulmonary edema: Secondary | ICD-10-CM

## 2019-04-01 LAB — GLUCOSE, CAPILLARY
Glucose-Capillary: 121 mg/dL — ABNORMAL HIGH (ref 70–99)
Glucose-Capillary: 136 mg/dL — ABNORMAL HIGH (ref 70–99)
Glucose-Capillary: 137 mg/dL — ABNORMAL HIGH (ref 70–99)
Glucose-Capillary: 151 mg/dL — ABNORMAL HIGH (ref 70–99)
Glucose-Capillary: 156 mg/dL — ABNORMAL HIGH (ref 70–99)
Glucose-Capillary: 157 mg/dL — ABNORMAL HIGH (ref 70–99)
Glucose-Capillary: 157 mg/dL — ABNORMAL HIGH (ref 70–99)
Glucose-Capillary: 159 mg/dL — ABNORMAL HIGH (ref 70–99)
Glucose-Capillary: 160 mg/dL — ABNORMAL HIGH (ref 70–99)
Glucose-Capillary: 168 mg/dL — ABNORMAL HIGH (ref 70–99)
Glucose-Capillary: 169 mg/dL — ABNORMAL HIGH (ref 70–99)
Glucose-Capillary: 171 mg/dL — ABNORMAL HIGH (ref 70–99)
Glucose-Capillary: 175 mg/dL — ABNORMAL HIGH (ref 70–99)
Glucose-Capillary: 179 mg/dL — ABNORMAL HIGH (ref 70–99)
Glucose-Capillary: 197 mg/dL — ABNORMAL HIGH (ref 70–99)
Glucose-Capillary: 198 mg/dL — ABNORMAL HIGH (ref 70–99)
Glucose-Capillary: 199 mg/dL — ABNORMAL HIGH (ref 70–99)
Glucose-Capillary: 269 mg/dL — ABNORMAL HIGH (ref 70–99)
Glucose-Capillary: 270 mg/dL — ABNORMAL HIGH (ref 70–99)

## 2019-04-01 LAB — POCT I-STAT 7, (LYTES, BLD GAS, ICA,H+H)
Acid-Base Excess: 5 mmol/L — ABNORMAL HIGH (ref 0.0–2.0)
Acid-Base Excess: 4 mmol/L — ABNORMAL HIGH (ref 0.0–2.0)
Bicarbonate: 29 mmol/L — ABNORMAL HIGH (ref 20.0–28.0)
Bicarbonate: 29.4 mmol/L — ABNORMAL HIGH (ref 20.0–28.0)
Calcium, Ion: 1.17 mmol/L (ref 1.15–1.40)
Calcium, Ion: 1.15 mmol/L (ref 1.15–1.40)
HCT: 41 % (ref 39.0–52.0)
HCT: 41 % (ref 39.0–52.0)
Hemoglobin: 13.9 g/dL (ref 13.0–17.0)
Hemoglobin: 13.9 g/dL (ref 13.0–17.0)
O2 Saturation: 86 %
O2 Saturation: 92 %
Patient temperature: 36.2
Potassium: 3.3 mmol/L — ABNORMAL LOW (ref 3.5–5.1)
Potassium: 3.4 mmol/L — ABNORMAL LOW (ref 3.5–5.1)
Sodium: 148 mmol/L — ABNORMAL HIGH (ref 135–145)
Sodium: 148 mmol/L — ABNORMAL HIGH (ref 135–145)
TCO2: 30 mmol/L (ref 22–32)
TCO2: 31 mmol/L (ref 22–32)
pCO2 arterial: 36.4 mmHg (ref 32.0–48.0)
pCO2 arterial: 44.2 mmHg (ref 32.0–48.0)
pH, Arterial: 7.505 — ABNORMAL HIGH (ref 7.350–7.450)
pH, Arterial: 7.43 (ref 7.350–7.450)
pO2, Arterial: 45 mmHg — ABNORMAL LOW (ref 83.0–108.0)
pO2, Arterial: 63 mmHg — ABNORMAL LOW (ref 83.0–108.0)

## 2019-04-01 LAB — CBC
HCT: 44.1 % (ref 39.0–52.0)
Hemoglobin: 13.3 g/dL (ref 13.0–17.0)
MCH: 27.3 pg (ref 26.0–34.0)
MCHC: 30.2 g/dL (ref 30.0–36.0)
MCV: 90.4 fL (ref 80.0–100.0)
Platelets: 418 10*3/uL — ABNORMAL HIGH (ref 150–400)
RBC: 4.88 MIL/uL (ref 4.22–5.81)
RDW: 14 % (ref 11.5–15.5)
WBC: 11.2 10*3/uL — ABNORMAL HIGH (ref 4.0–10.5)
nRBC: 0.4 % — ABNORMAL HIGH (ref 0.0–0.2)

## 2019-04-01 LAB — BASIC METABOLIC PANEL
Anion gap: 16 — ABNORMAL HIGH (ref 5–15)
BUN: 103 mg/dL — ABNORMAL HIGH (ref 6–20)
CO2: 29 mmol/L (ref 22–32)
Calcium: 8.2 mg/dL — ABNORMAL LOW (ref 8.9–10.3)
Chloride: 101 mmol/L (ref 98–111)
Creatinine, Ser: 1.55 mg/dL — ABNORMAL HIGH (ref 0.61–1.24)
GFR calc Af Amer: 56 mL/min — ABNORMAL LOW (ref >60)
GFR calc non Af Amer: 48 mL/min — ABNORMAL LOW (ref >60)
Glucose, Bld: 155 mg/dL — ABNORMAL HIGH (ref 70–99)
Potassium: 3.8 mmol/L (ref 3.5–5.1)
Sodium: 146 mmol/L — ABNORMAL HIGH (ref 135–145)

## 2019-04-01 LAB — C-REACTIVE PROTEIN: CRP: 5.4 mg/dL — ABNORMAL HIGH (ref <1.0)

## 2019-04-01 LAB — MAGNESIUM: Magnesium: 2.7 mg/dL — ABNORMAL HIGH (ref 1.7–2.4)

## 2019-04-01 LAB — D-DIMER, QUANTITATIVE: D-Dimer, Quant: 1.61 ug/mL-FEU — ABNORMAL HIGH (ref 0.00–0.50)

## 2019-04-01 LAB — PROCALCITONIN: Procalcitonin: 1.08 ng/mL

## 2019-04-01 LAB — PHOSPHORUS: Phosphorus: 3.7 mg/dL (ref 2.5–4.6)

## 2019-04-01 MED ORDER — FUROSEMIDE 10 MG/ML IJ SOLN
60.0000 mg | Freq: Once | INTRAMUSCULAR | Status: AC
  Administered 2019-04-01: 60 mg via INTRAVENOUS
  Filled 2019-04-01: qty 6

## 2019-04-01 MED ORDER — OXYCODONE HCL 5 MG/5ML PO SOLN
10.0000 mg | Freq: Four times a day (QID) | ORAL | Status: DC
  Administered 2019-04-01 – 2019-04-06 (×20): 10 mg
  Filled 2019-04-01 (×20): qty 10

## 2019-04-01 MED ORDER — FREE WATER
200.0000 mL | Freq: Four times a day (QID) | Status: DC
  Administered 2019-04-01 – 2019-04-02 (×5): 200 mL

## 2019-04-01 MED ORDER — ACETAMINOPHEN 650 MG RE SUPP
650.0000 mg | Freq: Four times a day (QID) | RECTAL | Status: DC | PRN
  Administered 2019-04-01: 650 mg via RECTAL
  Filled 2019-04-01: qty 1

## 2019-04-01 MED ORDER — SODIUM CHLORIDE 0.9 % IV SOLN
2.0000 g | Freq: Three times a day (TID) | INTRAVENOUS | Status: AC
  Administered 2019-04-01 – 2019-04-04 (×10): 2 g via INTRAVENOUS
  Filled 2019-04-01 (×10): qty 2

## 2019-04-01 MED ORDER — DOXAZOSIN MESYLATE 1 MG PO TABS
1.0000 mg | ORAL_TABLET | Freq: Every day | ORAL | Status: DC
  Administered 2019-04-01 – 2019-04-02 (×2): 1 mg
  Filled 2019-04-01 (×3): qty 1

## 2019-04-01 NOTE — Progress Notes (Signed)
NAME:  Angel Stuart, MRN:  [id number], DOB:  04-09-1961, LOS: 7 ADMISSION DATE:  04/19/2019, CONSULTATION DATE:  10/26 REFERRING MD:  Sharon Seller, CHIEF COMPLAINT:  Dyspnea   Brief History   58 y/o male admitted on 10/26 with ARDS from COVID 19 pneumonia required intubation in the [facility] ED.    Past Medical History  HTN HLD DM2  Significant Hospital Events   10/26 admission to ICU  Consults:  PCCM  Procedures:  10/26 ETT >  10/26 L subclavian CVL >   Significant Diagnostic Tests:  11/1 Lower extremity doppler ultrasound> negative for DVT  Micro Data:  10/29 blood >  10/30 sputum > pseudomonas, gram negative rods, GPC  Antimicrobials/COVID RX:  10/27 Remdesivir >  10/27 decadron >   10/30 vanc >  10/30 zosyn >   Interim history/subjective:  Fever persists Worsening oxygenation  Objective   Blood pressure 133/75, pulse 83, temperature (!) 97.2 F (36.2 C), temperature source Esophageal, resp. rate 16, height 5\' 11"  (1.803 m), weight 105.3 kg, SpO2 99 %.    Vent Mode: PCV FiO2 (%):  [40 %-50 %] 40 % Set Rate:  [15 bmp-20 bmp] 15 bmp Vt Set:  [600 mL] 600 mL PEEP:  [8 cmH20-10 cmH20] 10 cmH20 Plateau Pressure:  [16 cmH20-28 cmH20] 28 cmH20   Intake/Output Summary (Last 24 hours) at 04/01/2019 0751 Last data filed at 04/01/2019 0700 Gross per 24 hour  Intake 3101.86 ml  Output 3850 ml  Net -748.14 ml   Filed Weights   03/30/19 0500 03/31/19 0500 04/01/19 0500  Weight: 107 kg 107.3 kg 105.3 kg    Examination:  General:  In bed on vent HENT: NCAT ETT in place PULM: Crackles bases B, vent supported breathing CV: RRR, no mgr GI: BS+, soft, nontender MSK: normal bulk and tone Neuro: sedated on vent  11/2 CXR images personally reviewed> ETT in place, L subclavian CVL in place, bilateral infiltrates left base greater than R   Resolved Hospital Problem list     Assessment & Plan:  ARDS due to COVID 19 pneumonia: worsening oxygenation  Continue mechanical ventilation per ARDS protocol Target TVol 6-8cc/kgIBW Target Plateau Pressure < 30cm H20 Target driving pressure less than 15 cm of water Target PaO2 55-65: titrate PEEP/FiO2 per protocol As long as PaO2 to FiO2 ratio is less than 1:150 position in prone position for 16 hours a day Check CVP daily if CVL in place Target CVP less than 4, diurese as necessary Ventilator associated pneumonia prevention protocol 11/2 plan: change back to [facility] given large tidal volumes (around 10-12 cc/kg IBW 11/2 even on minimal pressure control settings) move back to prone position, continue diuresis  Need for sedation for mechanical ventilation Change RASS target to -3 given worsening oxygenation and dyssynchrony  Pseudomonas pneumonia Continue zosyn per pharmacy 7 days   Best practice:  Diet: continue tube feeding Pain/Anxiety/Delirium protocol (if indicated): yes as above VAP protocol (if indicated): yes DVT prophylaxis: lovenox 0.5mg  sub q bid GI prophylaxis: Pantoprazole for stress ulcer prophylaxis Glucose control: SSI Mobility: bed rest Code Status: Full Family Communication: I called his wife Melissa  Disposition: remain in ICU  Labs   CBC: Recent Labs  Lab 03/26/19 0529  03/27/19 0445  03/28/19 0450 03/29/19 0050  03/30/19 0430 03/31/19 0410 03/31/19 0500 04/01/19 0521 04/01/19 0605  WBC 16.1*  --  11.8*  --  11.3* 14.7*  --  12.4*  --  14.5* 11.2*  --   NEUTROABS 13.0*  --  8.6*  --  7.3 11.0*  --  10.2*  --   --   --   --   HGB 9.8*   < > 10.0*   < > 10.3* 12.0*   < > 12.3* 12.9* 12.8* 13.3 13.9  HCT 31.5*   < > 32.1*   < > 33.2* 38.8*   < > 39.3 38.0* 41.2 44.1 41.0  MCV 90.3  --  89.9  --  90.0 89.0  --  88.5  --  90.0 90.4  --   PLT 544*  --  476*  --  452* 465*  --  389  --  375 418*  --    < > = values in this interval not displayed.    Basic Metabolic Panel: Recent Labs  Lab 03/27/19 0445  03/28/19 0450 03/29/19 0050  03/30/19 0102 03/30/19  0430 03/31/19 0410 03/31/19 0500 04/01/19 0605  NA 140   < > 137 136   < > 136 137 144 145 148*  K 3.8   < > 4.0 4.0   < > 3.6 3.3* 3.9 4.0 3.3*  CL 94*  --  92* 88*  --   --  94*  --  103  --   CO2 33*  --  35* 33*  --   --  28  --  31  --   GLUCOSE 263*  --  221* 319*  --   --  383*  --  193*  --   BUN 45*  --  55* 67*  --   --  93*  --  91*  --   CREATININE 1.70*  --  1.70* 1.94*  --   --  1.84*  --  1.38*  --   CALCIUM 8.7*  --  8.9 9.1  --   --  7.7*  --  8.2*  --   MG 1.8  --  1.7 1.7  --   --  2.1  --  2.6*  --   PHOS 3.0  --  4.1 4.0  --   --  3.3  --  2.0*  --    < > = values in this interval not displayed.   GFR: Estimated Creatinine Clearance: 71.2 mL/min (A) (by C-G formula based on SCr of 1.38 mg/dL (H)). Recent Labs  Lab 03/27/19 0445  03/29/19 0050 03/29/19 1407 03/30/19 0430 03/31/19 0500 04/01/19 0521  PROCALCITON 0.22  --   --  4.01 2.82 1.33  --   WBC 11.8*   < > 14.7*  --  12.4* 14.5* 11.2*   < > = values in this interval not displayed.    Liver Function Tests: Recent Labs  Lab 03/26/19 0529 03/27/19 0445 03/28/19 0450 03/29/19 0050 03/30/19 0430  AST 27 39 34 34 31  ALT 17 21 24 24 24   ALKPHOS 90 82 73 73 60  BILITOT 0.2* 0.4 0.4 0.6 1.1  PROT 6.4* 6.8 7.2 8.0 7.1  ALBUMIN 2.3* 2.4* 2.5* 2.8* 2.4*   No results for input(s): LIPASE, AMYLASE in the last 168 hours. No results for input(s): AMMONIA in the last 168 hours.  ABG    Component Value Date/Time   PHART 7.505 (H) 04/01/2019 0605   PCO2ART 36.4 04/01/2019 0605   PO2ART 45.0 (L) 04/01/2019 0605   HCO3 29.0 (H) 04/01/2019 0605   TCO2 30 04/01/2019 0605   ACIDBASEDEF 3.0 (H) 06-Mar-2019 1442   O2SAT 86.0 04/01/2019 0605     Coagulation Profile:  No results for input(s): INR, PROTIME in the last 168 hours.  Cardiac Enzymes: No results for input(s): CKTOTAL, CKMB, CKMBINDEX, TROPONINI in the last 168 hours.  HbA1C: Hgb A1c MFr Bld  Date/Time Value Ref Range Status  04-24-19  01:00 PM 7.5 (H) 4.8 - 5.6 % Final    Comment:    (NOTE) Pre diabetes:          5.7%-6.4% Diabetes:              >6.4% Glycemic control for   <7.0% adults with diabetes     CBG: Recent Labs  Lab 03/31/19 1837 03/31/19 2043 03/31/19 2247 04/01/19 0051 04/01/19 0303  GLUCAP 151* 154* 180* 169* 159*     Critical care time: 40 minutes    Roselie Awkward, MD [facility] Pager: [phone] Cell: [phone] If no response, call [phone]

## 2019-04-01 NOTE — Progress Notes (Signed)
Pharmacy Antibiotic Note  Angel Stuart is a 58 y.o. male admitted on 03/21/2019 with pneumonia.  Pharmacy was initially consulted for Zosyn and vancomycin dosing, now narrowing to Ceftazidime for Pseudomonas pneumonia.  Plan: D/C Zosyn Ceftazidime 2g IV q8h Complete 7 days of treatment. Follow up renal function, culture results, and clinical course.   Height: 5\' 11"  (180.3 cm) Weight: 232 lb 2.3 oz (105.3 kg) IBW/kg (Calculated) : 75.3  Temp (24hrs), Avg:99.9 F (37.7 C), Min:96.1 F (35.6 C), Max:102.9 F (39.4 C)  Recent Labs  Lab 03/28/19 0450 03/29/19 0050 03/30/19 0430 03/31/19 0500 04/01/19 0521  WBC 11.3* 14.7* 12.4* 14.5* 11.2*  CREATININE 1.70* 1.94* 1.84* 1.38* 1.55*    Estimated Creatinine Clearance: 63.4 mL/min (A) (by C-G formula based on SCr of 1.55 mg/dL (H)).    Allergies  Allergen Reactions  . Clindamycin Hcl Other (See Comments)    Other reaction(s): Other (See Comments) Other reaction(s): ANAPHYLAXIS Other reaction(s): ANAPHYLAXIS Other reaction(s): Other (See Comments) Other reaction(s): ANAPHYLAXIS Other reaction(s): ANAPHYLAXIS Other reaction(s): ANAPHYLAXIS   . Semaglutide Nausea And Vomiting  . Hydromorphone Nausea And Vomiting    Per patient Per patient Per patient     Antimicrobials this admission: 10/27 remdesivir >> 10/30 10/30 Vanc >> 11/1 10/30 Zosyn >> 11/2 11/2 Ceftazidime >> (11/5)  Dose adjustments this admission:   Microbiology results: 10/26 MRSA PCR: negative 10/29 BCx ngtd  10/30 exp sputum: mod Pseudomonas aeruginosa  (pan-sens)  Thank you for allowing pharmacy to be a part of this patient's care.  Gretta Arab PharmD, BCPS Clinical pharmacist phone 7am- 5pm: [phone] 04/01/2019 10:55 AM

## 2019-04-01 NOTE — Progress Notes (Signed)
Patient proned.  Tolerated well.

## 2019-04-01 NOTE — Progress Notes (Signed)
Patient remains proned. Head repositioned. Arms rotated. ETT secured @ 26.

## 2019-04-01 NOTE — Progress Notes (Signed)
PROGRESS NOTE  Angel Stuart JXB:[id number] DOB: 1960-06-23 DOA: 03/23/2019  PCP: Lenox Ponds, MD  Brief History/Interval Summary: 58 year old with a history of hyperlipidemia and DM 2 who began to develop upper respiratory symptoms on 10/5, and ultimately tested positive for Covid on 10/9. Records suggest he experienced a gradual progression of symptoms with acute worsening of shortness of breath on 10/25, which prompted him to present to the [facility] ED. In the [place] ED he was noted to have a saturation of 63% on room air. Chest x-ray noted severe bilateral infiltrates. He rapidly decompensated in the emergency room. He failed a trial of BiPAP and had to be intubated. He was subsequently transferred to [facility] to the ICU. At the time of my exam he is intubated and sedated.  Reason for Visit: Acute respiratory failure with hypoxia.  Pneumonia due to COVID-19  Consultants: Pulmonology  Procedures: Intubated at [facility] ED  Antibiotics: Anti-infectives (From admission, onward)   Start     Dose/Rate Route Frequency Ordered Stop   03/30/19 1300  vancomycin (VANCOCIN) IVPB 1000 mg/200 mL premix  Status:  Discontinued     1,000 mg 200 mL/hr over 60 Minutes Intravenous Every 24 hours 03/29/19 1441 03/31/19 1027   03/29/19 2000  piperacillin-tazobactam (ZOSYN) IVPB 3.375 g     3.375 g 12.5 mL/hr over 240 Minutes Intravenous Every 8 hours 03/29/19 1234     03/29/19 1300  piperacillin-tazobactam (ZOSYN) IVPB 3.375 g     3.375 g 100 mL/hr over 30 Minutes Intravenous  Once 03/29/19 1228 03/29/19 1340   03/29/19 1300  vancomycin (VANCOCIN) 2,000 mg in sodium chloride 0.9 % 500 mL IVPB     2,000 mg 250 mL/hr over 120 Minutes Intravenous  Once 03/29/19 1228 03/29/19 1506   03/26/19 1000  remdesivir 100 mg in sodium chloride 0.9 % 250 mL IVPB     100 mg 500 mL/hr over 30 Minutes Intravenous Every 24 hours 03/24/2019 1031 03/29/19 1034      Subjective/Interval  History: Patient remains intubated and sedated   Assessment/Plan:  Acute Hypoxic Resp. Failure/Pneumonia due to COVID-19  Vent Mode: PRVC FiO2 (%):  [40 %-50 %] 50 % Set Rate:  [15 bmp] 15 bmp Vt Set:  [600 mL] 600 mL PEEP:  [8 cmH20-14 cmH20] 14 cmH20 Plateau Pressure:  [16 cmH20-29 cmH20] 29 cmH20     Component Value Date/Time   PHART 7.430 04/01/2019 0950   PCO2ART 44.2 04/01/2019 0950   PO2ART 63.0 (L) 04/01/2019 0950   HCO3 29.4 (H) 04/01/2019 0950   TCO2 31 04/01/2019 0950   ACIDBASEDEF 3.0 (H) 03/10/2019 1442   O2SAT 92.0 04/01/2019 0950    COVID-19 Labs  Recent Labs    03/30/19 0430 03/31/19 0500 04/01/19 0521  DDIMER 2.26* 2.34* 1.61*  FERRITIN 651*  --   --   CRP 18.7* 13.0* 5.4*    Patient had positive COVID-19 result on 10/9.  This was either at [facility] or one of the clinics in that area.  Fever: Fever curve appears to have improved in the last 24 hours. Oxygen requirements: Mechanical ventilation.  40-50% FiO2.  Saturating in the 90s.   Antibacterials: Vancomycin and Zosyn initiated 10/30 Remdesivir: Completed course on 10/30 Steroids: Dexamethasone 6 mg twice daily Diuretics: Patient being given Lasix on a daily basis. Actemra: Not given Convalescent plasma: Transfused on 10/26 Vitamin C and Zinc: Continue DVT Prophylaxis:  Lovenox 55 mg every 12 hours  Patient remains intubated and sedated.  Pulmonology  is following.  Due to agitation patient was started on Seroquel and is also on Klonopin.  From a COVID-19 standpoint patient has completed course of remdesivir.  He is on higher dose of dexamethasone due to significant increase in his CRP.  CRP is trending down.  Down to 5.4 today.  D-dimer improved to 1.61.  Patient was also given convalescent plasma.  No Actemra due to elevated procalcitonin.  Fever Patient is fever curve appears to be improving.  He still on a cooling blanket but could be taken off of it soon.  Infectious work-up only  revealed a Pseudomonas in his tracheal aspirate.  He remains on vancomycin and Zosyn.  Blood cultures were negative.  WBC has improved from 14.5-11.2.  Lower extremity Doppler study negative for DVT.  Procalcitonin was elevated at 4 and improved to 1.08.  MRSA PCR was negative.  Stop vancomycin.     Septic shock Was initially requiring Levophed which has been weaned off.  Continue to monitor.  Diabetes mellitus type 2, uncontrolled with hyperglycemia A1c 7.5.  Elevated CBGs most likely due to steroids.  CBGs were very poorly controlled so the patient was transitioned to IV insulin.  Seems to be better now.  Continue to monitor.    Acute renal failure/hypokalemia/Hypernatremia Creatinine noted to be stable for the most part.  Continue to monitor urine output.  Maintain negative fluid balance.  Water for elevated sodium levels.  Normocytic anemia No evidence of overt bleeding.  Continue to monitor hemoglobin.  Obesity Estimated body mass index is 32.38 kg/m as calculated from the following:   Height as of this encounter:  (1.803 m).   Weight as of this encounter: 105.3 kg.  FEN Not on IV fluids.  Monitor electrolytes.  He is getting tube feedings.  DVT Prophylaxis: On Lovenox adjusted for weight PUD Prophylaxis: Protonix once daily Code Status: Full code Family Communication: Wife being updated on a daily basis Disposition Plan: Remain in ICU for now   Medications:  Scheduled:  chlorhexidine  15 mL Mouth/Throat BID   Chlorhexidine Gluconate Cloth  6 each Topical Daily   clonazePAM  1 mg Per Tube BID   dexamethasone (DECADRON) injection  6 mg Intravenous Q12H   enoxaparin (LOVENOX) injection  55 mg Subcutaneous Q12H   feeding supplement (PRO-STAT SUGAR FREE 64)  30 mL Per Tube BID   mouth rinse  15 mL Mouth Rinse 10 times per day   oxyCODONE  5 mg Per Tube Q6H   pantoprazole sodium  40 mg Per Tube Daily   QUEtiapine  25 mg Oral BID   vitamin C  500 mg Per Tube  Daily   zinc sulfate  220 mg Per Tube Daily   Continuous:  dexmedetomidine (PRECEDEX) IV infusion 0.3 mcg/kg/hr (04/01/19 1000)   feeding supplement (VITAL AF 1.2 CAL) 1,000 mL (03/31/19 0135)   fentaNYL infusion INTRAVENOUS 60 mcg/hr (04/01/19 1000)   insulin 9.8 mL/hr at 04/01/19 1000   norepinephrine (LEVOPHED) Adult infusion Stopped (04/01/19 0755)   piperacillin-tazobactam (ZOSYN)  IV 3.375 g (04/01/19 0455)   ZOX:[id number], bisacodyl, chlorpheniramine-HYDROcodone, fentaNYL, guaiFENesin-dextromethorphan, ondansetron **OR** ondansetron (ZOFRAN) IV, sennosides   Objective:  Vital Signs  Vitals:   04/01/19 0900 04/01/19 0915 04/01/19 0930 04/01/19 0945  BP: 129/81 116/72 118/72   Pulse: 89 95 97 100  Resp: 17 (!) 24 (!) 25 20  Temp: (!) 96.6 F (35.9 C) (!) 96.4 F (35.8 C) (!) 96.1 F (35.6 C) (!) 96.4 F (35.8 C)  TempSrc:      SpO2: 96% 98% 96% (!) 85%  Weight:      Height:        Intake/Output Summary (Last 24 hours) at 04/01/2019 1021 Last data filed at 04/01/2019 1000 Gross per 24 hour  Intake 3033.2 ml  Output 4230 ml  Net -1196.8 ml   Filed Weights   03/30/19 0500 03/31/19 0500 04/01/19 0500  Weight: 107 kg 107.3 kg 105.3 kg    General appearance: Intubated and sedated Resp: Coarse breath sounds bilaterally.  No wheezing or rhonchi.  Crackles at the bases. Cardio: S1-S2 is normal regular.  No S3-S4.  No rubs murmurs or bruit GI: Abdomen is soft.  Nontender nondistended.  Bowel sounds are present normal.  No masses organomegaly Extremities: Minimal edema bilateral lower extremities Neurologic: Sedated.  Occasionally noted to move his extremities.    Lab Results:  Data Reviewed: I have personally reviewed following labs and imaging studies  CBC: Recent Labs  Lab 03/26/19 0529  03/27/19 0445  03/28/19 0450 03/29/19 0050  03/30/19 0430 03/31/19 0410 03/31/19 0500 04/01/19 0521 04/01/19 0605 04/01/19 0950  WBC 16.1*  --  11.8*  --   11.3* 14.7*  --  12.4*  --  14.5* 11.2*  --   --   NEUTROABS 13.0*  --  8.6*  --  7.3 11.0*  --  10.2*  --   --   --   --   --   HGB 9.8*   < > 10.0*   < > 10.3* 12.0*   < > 12.3* 12.9* 12.8* 13.3 13.9 13.9  HCT 31.5*   < > 32.1*   < > 33.2* 38.8*   < > 39.3 38.0* 41.2 44.1 41.0 41.0  MCV 90.3  --  89.9  --  90.0 89.0  --  88.5  --  90.0 90.4  --   --   PLT 544*  --  476*  --  452* 465*  --  389  --  375 418*  --   --    < > = values in this interval not displayed.    Basic Metabolic Panel: Recent Labs  Lab 03/28/19 0450 03/29/19 0050  03/30/19 0430 03/31/19 0410 03/31/19 0500 04/01/19 0521 04/01/19 0605 04/01/19 0950  NA 137 136   < > 137 144 145 146* 148* 148*  K 4.0 4.0   < > 3.3* 3.9 4.0 3.8 3.3* 3.4*  CL 92* 88*  --  94*  --  103 101  --   --   CO2 35* 33*  --  28  --  31 29  --   --   GLUCOSE 221* 319*  --  383*  --  193* 155*  --   --   BUN 55* 67*  --  93*  --  91* 103*  --   --   CREATININE 1.70* 1.94*  --  1.84*  --  1.38* 1.55*  --   --   CALCIUM 8.9 9.1  --  7.7*  --  8.2* 8.2*  --   --   MG 1.7 1.7  --  2.1  --  2.6* 2.7*  --   --   PHOS 4.1 4.0  --  3.3  --  2.0* 3.7  --   --    < > = values in this interval not displayed.    GFR: Estimated Creatinine Clearance: 63.4 mL/min (A) (by C-G formula based on SCr of 1.55  mg/dL (H)).  Liver Function Tests: Recent Labs  Lab 03/26/19 0529 03/27/19 0445 03/28/19 0450 03/29/19 0050 03/30/19 0430  AST 27 39 34 34 31  ALT 17 21 24 24 24   ALKPHOS 90 82 73 73 60  BILITOT 0.2* 0.4 0.4 0.6 1.1  PROT 6.4* 6.8 7.2 8.0 7.1  ALBUMIN 2.3* 2.4* 2.5* 2.8* 2.4*    HbA1C: No results for input(s): HGBA1C in the last 72 hours.  CBG: Recent Labs  Lab 04/01/19 0051 04/01/19 0303 04/01/19 0446 04/01/19 0742 04/01/19 0846  GLUCAP 169* 159* 160* 137* 157*     Anemia Panel: Recent Labs    03/30/19 0430  FERRITIN 651*    Recent Results (from the past 240 hour(s))  MRSA PCR Screening     Status: None   Collection  Time: 03/12/2019  8:57 PM   Specimen: Nasal Mucosa; Nasopharyngeal  Result Value Ref Range Status   MRSA by PCR NEGATIVE NEGATIVE Final    Comment:        The GeneXpert MRSA Assay (FDA approved for NASAL specimens only), is one component of a comprehensive MRSA colonization surveillance program. It is not intended to diagnose MRSA infection nor to guide or monitor treatment for MRSA infections. Performed at [facility], [place] [address] [place], [place] [address]   Culture, blood (routine x 2)     Status: None (Preliminary result)   Collection Time: 03/28/19 11:02 PM   Specimen: BLOOD  Result Value Ref Range Status   Specimen Description   Final    BLOOD RIGHT ANTECUBITAL Performed at [facility] [address], [place], [place] [address]    Special Requests   Final    BOTTLES DRAWN AEROBIC AND ANAEROBIC Blood Culture adequate volume Performed at [facility] [address], [place], [place] [address]    Culture   Final    NO GROWTH 3 DAYS Performed at [facility], [place] [address], [place], [place] [address]    Report Status PENDING  Incomplete  Culture, blood (routine x 2)     Status: None (Preliminary result)   Collection Time: 03/28/19 11:40 PM   Specimen: BLOOD LEFT HAND  Result Value Ref Range Status   Specimen Description   Final    BLOOD LEFT HAND Performed at [facility] [address], [place], [place] [address]    Special Requests   Final    BOTTLES DRAWN AEROBIC AND ANAEROBIC Blood Culture adequate volume Performed at [facility] [address] [place], [place] [address]    Culture   Final    NO GROWTH 3 DAYS Performed at [facility], [place] [address] [place], [place] [address]    Report Status PENDING  Incomplete  Expectorated sputum assessment w rflx to resp cult     Status: None   Collection Time: 03/29/19 12:50 AM   Specimen: Expectorated  Sputum  Result Value Ref Range Status   Specimen Description EXPECTORATED SPUTUM  Final   Special Requests Immunocompromised  Final   Sputum evaluation   Final    THIS SPECIMEN IS ACCEPTABLE FOR SPUTUM CULTURE Performed at [facility], [place] [address], [place],  [address]    Report Status 03/29/2019 FINAL  Final  Culture, respiratory     Status: None (Preliminary result)   Collection Time: 03/29/19 12:50 AM  Result Value Ref Range Status   Specimen Description   Final  EXPECTORATED SPUTUM Performed at [facility], [address] [place], [place] [address]    Special Requests   Final    Immunocompromised Reflexed from [id number] Performed at [facility], [address], [place], [place] [address]    Gram Stain   Final    MODERATE WBC PRESENT, PREDOMINANTLY PMN ABUNDANT GRAM NEGATIVE RODS MODERATE GRAM POSITIVE COCCI    Culture   Final    MODERATE PSEUDOMONAS AERUGINOSA CULTURE REINCUBATED FOR BETTER GROWTH Performed at [facility], [address], [place], [place] [address]    Report Status PENDING  Incomplete   Organism ID, Bacteria PSEUDOMONAS AERUGINOSA  Final      Susceptibility   Pseudomonas aeruginosa - MIC*    CEFTAZIDIME 4 SENSITIVE Sensitive     CIPROFLOXACIN <=0.25 SENSITIVE Sensitive     GENTAMICIN 4 SENSITIVE Sensitive     IMIPENEM 2 SENSITIVE Sensitive     PIP/TAZO 16 SENSITIVE Sensitive     CEFEPIME 4 SENSITIVE Sensitive     * MODERATE PSEUDOMONAS AERUGINOSA      Radiology Studies: Dg Chest Port 1 View  Result Date: 04/01/2019 CLINICAL DATA:  COVID positive patient.  Endotracheal tube in place. EXAM: PORTABLE CHEST 1 VIEW COMPARISON:  03/31/2019; 03/30/2019; 03/29/2019 FINDINGS: Grossly unchanged cardiac silhouette and mediastinal contours given patient rotation. Stable positioning of support apparatus. Worsening bilateral interstitial airspace opacities, worse within the left mid and  lower lung. No definite pleural effusion or pneumothorax. No definite evidence of edema. No acute osseous abnormalities. IMPRESSION: 1. Worsening bilateral interstitial airspace opacities, left greater than right, potentially atelectasis though worrisome for progression of atypical infection. 2. Stable positioning of support apparatus.  No pneumothorax. Electronically Signed   By: Simonne Come M.D.   On: 04/01/2019 07:49   Dg Chest Port 1 View  Result Date: 03/31/2019 CLINICAL DATA:  Intubated. COVID-19 positive. EXAM: PORTABLE CHEST 1 VIEW COMPARISON:  03/30/2019 FINDINGS: Endotracheal tube in satisfactory position. Feeding tube extending into the stomach. Borderline enlarged cardiac silhouette. Mild increase in patchy and interstitial opacities in both lungs. No definite pleural fluid. No acute bony abnormality. IMPRESSION: Mildly progressive bilateral pneumonia or pneumonitis. Electronically Signed   By: Beckie Salts M.D.   On: 03/31/2019 11:12   Vas [place] Lower Extremity Venous (dvt)  Result Date: 03/31/2019  Lower Venous Study Indications: Swelling.  Risk Factors: COVID 19 positive. Comparison Study: No prior studies. Performing Technologist: Chanda Busing RVT  Examination Guidelines: A complete evaluation includes B-mode imaging, spectral Doppler, color Doppler, and power Doppler as needed of all accessible portions of each vessel. Bilateral testing is considered an integral part of a complete examination. Limited examinations for reoccurring indications may be performed as noted.  +---------+---------------+---------+-----------+----------+--------------+  RIGHT     Compressibility Phasicity Spontaneity Properties Thrombus Aging  +---------+---------------+---------+-----------+----------+--------------+  CFV       Full            Yes       Yes                                    +---------+---------------+---------+-----------+----------+--------------+  SFJ       Full                                                              +---------+---------------+---------+-----------+----------+--------------+  FV Prox   Full                                                             +---------+---------------+---------+-----------+----------+--------------+  FV Mid    Full                                                             +---------+---------------+---------+-----------+----------+--------------+  FV Distal Full                                                             +---------+---------------+---------+-----------+----------+--------------+  PFV       Full                                                             +---------+---------------+---------+-----------+----------+--------------+  POP       Full            Yes       Yes                                    +---------+---------------+---------+-----------+----------+--------------+  PTV       Full                                                             +---------+---------------+---------+-----------+----------+--------------+  PERO      Full                                                             +---------+---------------+---------+-----------+----------+--------------+   +---------+---------------+---------+-----------+----------+--------------+  LEFT      Compressibility Phasicity Spontaneity Properties Thrombus Aging  +---------+---------------+---------+-----------+----------+--------------+  CFV       Full            Yes       Yes                                    +---------+---------------+---------+-----------+----------+--------------+  SFJ       Full                                                             +---------+---------------+---------+-----------+----------+--------------+  FV Prox   Full                                                             +---------+---------------+---------+-----------+----------+--------------+  FV Mid    Full                                                              +---------+---------------+---------+-----------+----------+--------------+  FV Distal Full                                                             +---------+---------------+---------+-----------+----------+--------------+  PFV       Full                                                             +---------+---------------+---------+-----------+----------+--------------+  POP       Full            Yes       Yes                                    +---------+---------------+---------+-----------+----------+--------------+  PTV       Full                                                             +---------+---------------+---------+-----------+----------+--------------+  PERO      Full                                                             +---------+---------------+---------+-----------+----------+--------------+     Summary: Right: There is no evidence of deep vein thrombosis in the lower extremity. No cystic structure found in the popliteal fossa. Left: There is no evidence of deep vein thrombosis in the lower extremity. No cystic structure found in the popliteal fossa.  *See table(s) above for measurements and observations. Electronically signed by Waverly Ferrari MD on 03/31/2019 at 5:28:44 PM.    Final        LOS: 7 days   Angel Stuart  [facility] Pager on [email or web address]  04/01/2019, 10:21 AM

## 2019-04-01 NOTE — Progress Notes (Signed)
Head repositioned. Arms rotated. ETT secured @ 25 cm.

## 2019-04-01 NOTE — Progress Notes (Signed)
Spoke with patient's spouse and provided full update/answered all questions.

## 2019-04-01 NOTE — Progress Notes (Signed)
[facility] Progress Note Patient Name: Angel Stuart DOB: 02/11/1961 MRN: [id number]   Date of Service  04/01/2019  HPI/Events of Note  Fever to 101.6 F in spite of Tylenol and cooling blanket. Nursing concern for enteral absorption. Request for change of tylenol route to rectal.    eICU Interventions  Will order: 1. D/C Tylenol per tube.  2. Tylenol suppository 650 mg PR Q 6 hours PRN Temp > 101.0 F.     Intervention Category Major Interventions: Other:  Lysle Dingwall 04/01/2019, 10:26 PM

## 2019-04-02 DIAGNOSIS — N179 Acute kidney failure, unspecified: Secondary | ICD-10-CM

## 2019-04-02 DIAGNOSIS — J80 Acute respiratory distress syndrome: Secondary | ICD-10-CM

## 2019-04-02 DIAGNOSIS — U071 COVID-19: Secondary | ICD-10-CM

## 2019-04-02 LAB — BASIC METABOLIC PANEL
Anion gap: 9 (ref 5–15)
BUN: 104 mg/dL — ABNORMAL HIGH (ref 6–20)
CO2: 34 mmol/L — ABNORMAL HIGH (ref 22–32)
Calcium: 8.5 mg/dL — ABNORMAL LOW (ref 8.9–10.3)
Chloride: 107 mmol/L (ref 98–111)
Creatinine, Ser: 1.61 mg/dL — ABNORMAL HIGH (ref 0.61–1.24)
GFR calc Af Amer: 53 mL/min — ABNORMAL LOW (ref >60)
GFR calc non Af Amer: 46 mL/min — ABNORMAL LOW (ref >60)
Glucose, Bld: 149 mg/dL — ABNORMAL HIGH (ref 70–99)
Potassium: 4.5 mmol/L (ref 3.5–5.1)
Sodium: 150 mmol/L — ABNORMAL HIGH (ref 135–145)

## 2019-04-02 LAB — GLUCOSE, CAPILLARY
Glucose-Capillary: 146 mg/dL — ABNORMAL HIGH (ref 70–99)
Glucose-Capillary: 146 mg/dL — ABNORMAL HIGH (ref 70–99)
Glucose-Capillary: 144 mg/dL — ABNORMAL HIGH (ref 70–99)
Glucose-Capillary: 139 mg/dL — ABNORMAL HIGH (ref 70–99)
Glucose-Capillary: 146 mg/dL — ABNORMAL HIGH (ref 70–99)
Glucose-Capillary: 156 mg/dL — ABNORMAL HIGH (ref 70–99)
Glucose-Capillary: 158 mg/dL — ABNORMAL HIGH (ref 70–99)
Glucose-Capillary: 174 mg/dL — ABNORMAL HIGH (ref 70–99)
Glucose-Capillary: 189 mg/dL — ABNORMAL HIGH (ref 70–99)
Glucose-Capillary: 177 mg/dL — ABNORMAL HIGH (ref 70–99)
Glucose-Capillary: 255 mg/dL — ABNORMAL HIGH (ref 70–99)
Glucose-Capillary: 276 mg/dL — ABNORMAL HIGH (ref 70–99)
Glucose-Capillary: 255 mg/dL — ABNORMAL HIGH (ref 70–99)
Glucose-Capillary: 206 mg/dL — ABNORMAL HIGH (ref 70–99)
Glucose-Capillary: 166 mg/dL — ABNORMAL HIGH (ref 70–99)
Glucose-Capillary: 164 mg/dL — ABNORMAL HIGH (ref 70–99)
Glucose-Capillary: 141 mg/dL — ABNORMAL HIGH (ref 70–99)

## 2019-04-02 LAB — POCT I-STAT 7, (LYTES, BLD GAS, ICA,H+H)
Acid-Base Excess: 9 mmol/L — ABNORMAL HIGH (ref 0.0–2.0)
Bicarbonate: 36.7 mmol/L — ABNORMAL HIGH (ref 20.0–28.0)
Calcium, Ion: 1.21 mmol/L (ref 1.15–1.40)
HCT: 41 % (ref 39.0–52.0)
Hemoglobin: 13.9 g/dL (ref 13.0–17.0)
O2 Saturation: 99 %
Patient temperature: 37.5
Potassium: 4.8 mmol/L (ref 3.5–5.1)
Sodium: 150 mmol/L — ABNORMAL HIGH (ref 135–145)
TCO2: 39 mmol/L — ABNORMAL HIGH (ref 22–32)
pCO2 arterial: 63.8 mmHg — ABNORMAL HIGH (ref 32.0–48.0)
pH, Arterial: 7.37 (ref 7.350–7.450)
pO2, Arterial: 143 mmHg — ABNORMAL HIGH (ref 83.0–108.0)

## 2019-04-02 LAB — PROCALCITONIN: Procalcitonin: 0.5 ng/mL

## 2019-04-02 LAB — CBC
HCT: 49 % (ref 39.0–52.0)
Hemoglobin: 14.2 g/dL (ref 13.0–17.0)
MCH: 27.6 pg (ref 26.0–34.0)
MCHC: 29 g/dL — ABNORMAL LOW (ref 30.0–36.0)
MCV: 95.1 fL (ref 80.0–100.0)
Platelets: 380 10*3/uL (ref 150–400)
RBC: 5.15 MIL/uL (ref 4.22–5.81)
RDW: 14 % (ref 11.5–15.5)
WBC: 7.1 10*3/uL (ref 4.0–10.5)
nRBC: 0.3 % — ABNORMAL HIGH (ref 0.0–0.2)

## 2019-04-02 LAB — C-REACTIVE PROTEIN: CRP: 2.3 mg/dL — ABNORMAL HIGH (ref <1.0)

## 2019-04-02 LAB — D-DIMER, QUANTITATIVE: D-Dimer, Quant: 1.46 ug/mL-FEU — ABNORMAL HIGH (ref 0.00–0.50)

## 2019-04-02 MED ORDER — FUROSEMIDE 10 MG/ML IJ SOLN
60.0000 mg | Freq: Once | INTRAMUSCULAR | Status: AC
  Administered 2019-04-02: 60 mg via INTRAVENOUS
  Filled 2019-04-02: qty 6

## 2019-04-02 MED ORDER — DEXTROSE 10 % IV SOLN: INTRAVENOUS | Status: DC | PRN

## 2019-04-02 MED ORDER — INSULIN DETEMIR 100 UNIT/ML ~~LOC~~ SOLN
20.0000 [IU] | Freq: Two times a day (BID) | SUBCUTANEOUS | Status: DC
  Administered 2019-04-02: 12:00:00 20 [IU] via SUBCUTANEOUS
  Filled 2019-04-02 (×2): qty 0.2

## 2019-04-02 MED ORDER — INSULIN REGULAR(HUMAN) IN NACL 100-0.9 UT/100ML-% IV SOLN
INTRAVENOUS | Status: DC
  Administered 2019-04-03: 7.9 [IU]/h via INTRAVENOUS
  Filled 2019-04-02: qty 100

## 2019-04-02 MED ORDER — INSULIN ASPART 100 UNIT/ML ~~LOC~~ SOLN
6.0000 [IU] | SUBCUTANEOUS | Status: DC
  Administered 2019-04-02: 14:00:00 6 [IU] via SUBCUTANEOUS

## 2019-04-02 MED ORDER — INSULIN ASPART 100 UNIT/ML ~~LOC~~ SOLN
3.0000 [IU] | SUBCUTANEOUS | Status: DC
  Administered 2019-04-02: 14:00:00 6 [IU] via SUBCUTANEOUS

## 2019-04-02 MED ORDER — FREE WATER
300.0000 mL | Status: DC
  Administered 2019-04-02 – 2019-04-04 (×13): 300 mL

## 2019-04-02 NOTE — Progress Notes (Signed)
PROGRESS NOTE  Angel Stuart ZOX:[id number] DOB: Nov 05, 1960 DOA: 03/03/2019  PCP: Lenox Ponds, MD  Brief History/Interval Summary: 58 year old with a history of hyperlipidemia and DM 2 who began to develop upper respiratory symptoms on 10/5, and ultimately tested positive for Covid on 10/9. Records suggest he experienced a gradual progression of symptoms with acute worsening of shortness of breath on 10/25, which prompted him to present to the [facility] ED. In the [place] ED he was noted to have a saturation of 63% on room air. Chest x-ray noted severe bilateral infiltrates. He rapidly decompensated in the emergency room. He failed a trial of BiPAP and had to be intubated. He was subsequently transferred to [facility] to the ICU.   Reason for Visit: Acute respiratory failure with hypoxia.  Pneumonia due to COVID-19  Consultants: Pulmonology  Procedures: Intubated at [facility] ED  Antibiotics: Anti-infectives (From admission, onward)   Start     Dose/Rate Route Frequency Ordered Stop   04/01/19 2000  cefTAZidime (FORTAZ) 2 g in sodium chloride 0.9 % 100 mL IVPB     2 g 200 mL/hr over 30 Minutes Intravenous Every 8 hours 04/01/19 1609     03/30/19 1300  vancomycin (VANCOCIN) IVPB 1000 mg/200 mL premix  Status:  Discontinued     1,000 mg 200 mL/hr over 60 Minutes Intravenous Every 24 hours 03/29/19 1441 03/31/19 1027   03/29/19 2000  piperacillin-tazobactam (ZOSYN) IVPB 3.375 g  Status:  Discontinued     3.375 g 12.5 mL/hr over 240 Minutes Intravenous Every 8 hours 03/29/19 1234 04/01/19 1609   03/29/19 1300  piperacillin-tazobactam (ZOSYN) IVPB 3.375 g     3.375 g 100 mL/hr over 30 Minutes Intravenous  Once 03/29/19 1228 03/29/19 1340   03/29/19 1300  vancomycin (VANCOCIN) 2,000 mg in sodium chloride 0.9 % 500 mL IVPB     2,000 mg 250 mL/hr over 120 Minutes Intravenous  Once 03/29/19 1228 03/29/19 1506   03/26/19 1000  remdesivir 100 mg in sodium chloride  0.9 % 250 mL IVPB     100 mg 500 mL/hr over 30 Minutes Intravenous Every 24 hours 03/02/2019 1031 03/29/19 1034      Subjective/Interval History: Patient remains intubated and sedated   Assessment/Plan:  Acute Hypoxic Resp. Failure/Pneumonia due to COVID-19/Pseudomonas pneumonia  Vent Mode: PRVC FiO2 (%):  [50 %] 50 % Set Rate:  [15 bmp] 15 bmp Vt Set:  [600 mL] 600 mL PEEP:  [14 cmH20] 14 cmH20 Plateau Pressure:  [21 cmH20-29 cmH20] 29 cmH20     Component Value Date/Time   PHART 7.430 04/01/2019 0950   PCO2ART 44.2 04/01/2019 0950   PO2ART 63.0 (L) 04/01/2019 0950   HCO3 29.4 (H) 04/01/2019 0950   TCO2 31 04/01/2019 0950   ACIDBASEDEF 3.0 (H) 03/27/2019 1442   O2SAT 92.0 04/01/2019 0950    COVID-19 Labs  Recent Labs    03/31/19 0500 04/01/19 0521 04/02/19 0400  DDIMER 2.34* 1.61* 1.46*  CRP 13.0* 5.4* 2.3*    Patient had positive COVID-19 result on 10/9.  This was either at [facility] or one of the clinics in that area.  Fever: Patient with persistent fever.   Oxygen requirements: Mechanical ventilation.  50% FiO2.Marland Kitchen   Antibacterials: Vancomycin and Zosyn initiated 10/30.  Vancomycin and Zosyn stopped.  Now on ceftazidime. Remdesivir: Completed course on 10/30 Steroids: Dexamethasone 6 mg twice daily Diuretics: Patient being given Lasix on a daily basis. Actemra: Not given Convalescent plasma: Transfused on 10/26 Vitamin C and  Zinc: Continue DVT Prophylaxis:  Lovenox 55 mg every 12 hours  Patient remains intubated and sedated.  Pulmonology is following.  Due to agitation patient was started on Seroquel and is also on Klonopin.  From a COVID-19 standpoint patient has completed course of remdesivir.  He is on higher dose of dexamethasone due to increase in CRP.  CRP has improved to 2.3.  D-dimer 1.46.  Patient was also given convalescent plasma.  No Actemra due to elevated procalcitonin.  Due to persistent fever as discussed below patient was started on  vancomycin and Zosyn.  Tracheal aspirate cultures positive for Pseudomonas.  Now on ceftazidime only.  WBC is normal today.  Fever Patient continues to have fever.  Could be due to Pseudomonas infection.  We have evaluated patient for other etiologies including medications.  He is on only low-dose of Seroquel which is unlikely to cause significant fever.  Lower extremity Dopplers were negative for DVT.  No sinus drainage noted.  No new skin rashes.  No abnormality noted around the anal area.  Discussed with critical care medicine today.  Hold off on imaging studies for another 1 to 2 days.  Check LFTs tomorrow.  If he continues to spike fevers then may have to consider imaging studies.     Septic shock Requiring Levophed.  Diabetes mellitus type 2, uncontrolled with hyperglycemia A1c 7.5.  Elevated CBGs most likely due to steroids.  CBGs were very poorly controlled so the patient was transitioned to IV insulin.  Improved.  Transition back to Lantus insulin.  Acute renal failure/hypokalemia/Hypernatremia Renal function stable for the most part.  Has good urine output.  Maintain negative fluid balance.  Free water was initiated for hyponatremia.  Increase dose today.    Normocytic anemia No evidence of overt bleeding.  Continue to monitor hemoglobin.  Obesity Estimated body mass index is 32.41 kg/m as calculated from the following:   Height as of this encounter: 5\' 11"  (1.803 m).   Weight as of this encounter: 105.4 kg.  FEN Not on IV fluids.  Free water down the feeding tube.  Tube feedings to continue.  Monitor electrolytes.  DVT Prophylaxis: On Lovenox adjusted for weight PUD Prophylaxis: Protonix once daily Code Status: Full code Family Communication: Wife being updated on a daily basis Disposition Plan: Remain in ICU for now   Medications:  Scheduled:  chlorhexidine  15 mL Mouth/Throat BID   Chlorhexidine Gluconate Cloth  6 each Topical Daily   clonazePAM  1 mg Per Tube BID    dexamethasone (DECADRON) injection  6 mg Intravenous Q12H   doxazosin  1 mg Per Tube Daily   enoxaparin (LOVENOX) injection  55 mg Subcutaneous Q12H   feeding supplement (PRO-STAT SUGAR FREE 64)  30 mL Per Tube BID   free water  200 mL Per Tube Q6H   mouth rinse  15 mL Mouth Rinse 10 times per day   oxyCODONE  10 mg Per Tube Q6H   pantoprazole sodium  40 mg Per Tube Daily   QUEtiapine  25 mg Oral BID   vitamin C  500 mg Per Tube Daily   zinc sulfate  220 mg Per Tube Daily   Continuous:  cefTAZidime (FORTAZ)  IV Stopped (04/02/19 0541)   dexmedetomidine (PRECEDEX) IV infusion 0.4 mcg/kg/hr (04/02/19 1000)   feeding supplement (VITAL AF 1.2 CAL) 40 mL/hr at 04/02/19 0700   fentaNYL infusion INTRAVENOUS 100 mcg/hr (04/02/19 1000)   insulin 4.4 mL/hr at 04/02/19 1000   norepinephrine (LEVOPHED)  Adult infusion Stopped (04/02/19 9147)   WGN:FAOZHYQMVHQIO, bisacodyl, chlorpheniramine-HYDROcodone, fentaNYL, guaiFENesin-dextromethorphan, ondansetron **OR** ondansetron (ZOFRAN) IV, sennosides   Objective:  Vital Signs  Vitals:   04/02/19 0900 04/02/19 0930 04/02/19 0945 04/02/19 1000  BP: 111/75 108/79 107/74 110/75  Pulse: (!) 113 (!) 111 (!) 113 (!) 112  Resp: Temp: (!) 100.6 F (38.1 C) (!) 100.4 F (38 C) 100.2 F (37.9 C) 100.2 F (37.9 C)  TempSrc:      SpO2: 100% 100% 99% 100%  Weight:      Height:        Intake/Output Summary (Last 24 hours) at 04/02/2019 1050 Last data filed at 04/02/2019 1000 Gross per 24 hour  Intake 3956.63 ml  Output 2580 ml  Net 1376.63 ml   Filed Weights   03/31/19 0500 04/01/19 0500 04/02/19 0419  Weight: 107.3 kg 105.3 kg 105.4 kg    General appearance: Intubated and sedated Resp: Coarse breath sounds bilaterally.  Crackles at the bases.  No wheezing or rhonchi. Cardio: S1-S2 is normal regular.  No S3-S4.  No rubs murmurs or bruit GI: Abdomen is soft.  Nontender nondistended.  Bowel sounds are present  normal.  No masses organomegaly Extremities: Minimal edema bilateral lower extremities Neurologic: Intubated and sedated   Lab Results:  Data Reviewed: I have personally reviewed following labs and imaging studies  CBC: Recent Labs  Lab 03/27/19 0445  03/28/19 0450 03/29/19 0050  03/30/19 0430  03/31/19 0500 04/01/19 0521 04/01/19 0605 04/01/19 0950 04/02/19 0400  WBC 11.8*  --  11.3* 14.7*  --  12.4*  --  14.5* 11.2*  --   --  7.1  NEUTROABS 8.6*  --  7.3 11.0*  --  10.2*  --   --   --   --   --   --   HGB 10.0*   < > 10.3* 12.0*   < > 12.3*   < > 12.8* 13.3 13.9 13.9 14.2  HCT 32.1*   < > 33.2* 38.8*   < > 39.3   < > 41.2 44.1 41.0 41.0 49.0  MCV 89.9  --  90.0 89.0  --  88.5  --  90.0 90.4  --   --  95.1  PLT 476*  --  452* 465*  --  389  --  375 418*  --   --  380   < > = values in this interval not displayed.    Basic Metabolic Panel: Recent Labs  Lab 03/28/19 0450 03/29/19 0050  03/30/19 0430  03/31/19 0500 04/01/19 0521 04/01/19 0605 04/01/19 0950 04/02/19 0400  NA 137 136   < > 137   < > 145 146* 148* 148* 150*  K 4.0 4.0   < > 3.3*   < > 4.0 3.8 3.3* 3.4* 4.5  CL 92* 88*  --  94*  --  103 101  --   --  107  CO2 35* 33*  --  28  --  31 29  --   --  34*  GLUCOSE 221* 319*  --  383*  --  193* 155*  --   --  149*  BUN 55* 67*  --  93*  --  91* 103*  --   --  104*  CREATININE 1.70* 1.94*  --  1.84*  --  1.38* 1.55*  --   --  1.61*  CALCIUM 8.9 9.1  --  7.7*  --  8.2* 8.2*  --   --  8.5*  MG 1.7 1.7  --  2.1  --  2.6* 2.7*  --   --   --   PHOS 4.1 4.0  --  3.3  --  2.0* 3.7  --   --   --    < > = values in this interval not displayed.    GFR: Estimated Creatinine Clearance: 61 mL/min (A) (by C-G formula based on SCr of 1.61 mg/dL (H)).  Liver Function Tests: Recent Labs  Lab 03/27/19 0445 03/28/19 0450 03/29/19 0050 03/30/19 0430  AST 39 34 34 31  ALT ALKPHOS 82 73 73 60  BILITOT 0.4 0.4 0.6 1.1  PROT 6.8 7.2 8.0 7.1  ALBUMIN 2.4*  2.5* 2.8* 2.4*    CBG: Recent Labs  Lab 04/02/19 0442 04/02/19 0646 04/02/19 0748 04/02/19 0850 04/02/19 0956  GLUCAP 144* 139* 146* 156* 158*      Recent Results (from the past 240 hour(s))  MRSA PCR Screening     Status: None   Collection Time: 2019-03-29  8:57 PM   Specimen: Nasal Mucosa; Nasopharyngeal  Result Value Ref Range Status   MRSA by PCR NEGATIVE NEGATIVE Final    Comment:        The GeneXpert MRSA Assay (FDA approved for NASAL specimens only), is one component of a comprehensive MRSA colonization surveillance program. It is not intended to diagnose MRSA infection nor to guide or monitor treatment for MRSA infections. Performed at [facility], [address] [place], [place] [address]   Culture, blood (routine x 2)     Status: None (Preliminary result)   Collection Time: 03/28/19 11:02 PM   Specimen: BLOOD  Result Value Ref Range Status   Specimen Description   Final    BLOOD RIGHT ANTECUBITAL Performed at [facility], [address] [place], [place] [address]    Special Requests   Final    BOTTLES DRAWN AEROBIC AND ANAEROBIC Blood Culture adequate volume Performed at [facility], [address], [place], [place] [address]    Culture   Final    NO GROWTH 4 DAYS Performed at [facility], [address] [place], [place] [address]    Report Status PENDING  Incomplete  Culture, blood (routine x 2)     Status: None (Preliminary result)   Collection Time: 03/28/19 11:40 PM   Specimen: BLOOD LEFT HAND  Result Value Ref Range Status   Specimen Description   Final    BLOOD LEFT HAND Performed at [facility], [address] [place], [place] [address]    Special Requests   Final    BOTTLES DRAWN AEROBIC AND ANAEROBIC Blood Culture adequate volume Performed at [facility], [address], [place], [place] [address]    Culture   Final    NO  GROWTH 4 DAYS Performed at [facility], [address], [place], [place] [address]    Report Status PENDING  Incomplete  Expectorated sputum assessment w rflx to resp cult     Status: None   Collection Time: 03/29/19 12:50 AM   Specimen: Expectorated Sputum  Result Value Ref Range Status   Specimen Description EXPECTORATED SPUTUM  Final   Special Requests Immunocompromised  Final   Sputum evaluation   Final    THIS SPECIMEN IS ACCEPTABLE FOR SPUTUM CULTURE Performed at [facility], [address], [place], [place] [address]  Report Status 03/29/2019 FINAL  Final  Culture, respiratory     Status: None (Preliminary result)   Collection Time: 03/29/19 12:50 AM  Result Value Ref Range Status   Specimen Description   Final    EXPECTORATED SPUTUM Performed at [facility], [place] [address] [place], [place] [address]    Special Requests   Final    Immunocompromised Reflexed from [id number] Performed at [facility], [place] [address], [place], [place] [address]    Gram Stain   Final    MODERATE WBC PRESENT, PREDOMINANTLY PMN ABUNDANT GRAM NEGATIVE RODS MODERATE GRAM POSITIVE COCCI    Culture   Final    MODERATE PSEUDOMONAS AERUGINOSA CULTURE REINCUBATED FOR BETTER GROWTH Performed at [facility], [place] [address], [place],  [address]    Report Status PENDING  Incomplete   Organism ID, Bacteria PSEUDOMONAS AERUGINOSA  Final      Susceptibility   Pseudomonas aeruginosa - MIC*    CEFTAZIDIME 4 SENSITIVE Sensitive     CIPROFLOXACIN <=0.25 SENSITIVE Sensitive     GENTAMICIN 4 SENSITIVE Sensitive     IMIPENEM 2 SENSITIVE Sensitive     PIP/TAZO 16 SENSITIVE Sensitive     CEFEPIME 4 SENSITIVE Sensitive     * MODERATE PSEUDOMONAS AERUGINOSA      Radiology Studies: Dg Chest Port 1 View  Result Date: 04/01/2019 CLINICAL DATA:  COVID positive patient.  Endotracheal tube in place. EXAM: PORTABLE CHEST 1 VIEW  COMPARISON:  03/31/2019; 03/30/2019; 03/29/2019 FINDINGS: Grossly unchanged cardiac silhouette and mediastinal contours given patient rotation. Stable positioning of support apparatus. Worsening bilateral interstitial airspace opacities, worse within the left mid and lower lung. No definite pleural effusion or pneumothorax. No definite evidence of edema. No acute osseous abnormalities. IMPRESSION: 1. Worsening bilateral interstitial airspace opacities, left greater than right, potentially atelectasis though worrisome for progression of atypical infection. 2. Stable positioning of support apparatus.  No pneumothorax. Electronically Signed   By: Sandi Mariscal M.D.   On: 04/01/2019 07:49   Vas [place] Lower Extremity Venous (dvt)  Result Date: 03/31/2019  Lower Venous Study Indications: Swelling.  Risk Factors: COVID 19 positive. Comparison Study: No prior studies. Performing Technologist: Oliver Hum RVT  Examination Guidelines: A complete evaluation includes B-mode imaging, spectral Doppler, color Doppler, and power Doppler as needed of all accessible portions of each vessel. Bilateral testing is considered an integral part of a complete examination. Limited examinations for reoccurring indications may be performed as noted.  +---------+---------------+---------+-----------+----------+--------------+  RIGHT     Compressibility Phasicity Spontaneity Properties Thrombus Aging  +---------+---------------+---------+-----------+----------+--------------+  CFV       Full            Yes       Yes                                    +---------+---------------+---------+-----------+----------+--------------+  SFJ       Full                                                             +---------+---------------+---------+-----------+----------+--------------+  FV Prox   Full                                                             +---------+---------------+---------+-----------+----------+--------------+  FV Mid    Full                                                              +---------+---------------+---------+-----------+----------+--------------+  FV Distal Full                                                             +---------+---------------+---------+-----------+----------+--------------+  PFV       Full                                                             +---------+---------------+---------+-----------+----------+--------------+  POP       Full            Yes       Yes                                    +---------+---------------+---------+-----------+----------+--------------+  PTV       Full                                                             +---------+---------------+---------+-----------+----------+--------------+  PERO      Full                                                             +---------+---------------+---------+-----------+----------+--------------+   +---------+---------------+---------+-----------+----------+--------------+  LEFT      Compressibility Phasicity Spontaneity Properties Thrombus Aging  +---------+---------------+---------+-----------+----------+--------------+  CFV       Full            Yes       Yes                                    +---------+---------------+---------+-----------+----------+--------------+  SFJ       Full                                                             +---------+---------------+---------+-----------+----------+--------------+  FV Prox   Full                                                             +---------+---------------+---------+-----------+----------+--------------+  FV Mid    Full                                                             +---------+---------------+---------+-----------+----------+--------------+  FV Distal Full                                                             +---------+---------------+---------+-----------+----------+--------------+  PFV       Full                                                              +---------+---------------+---------+-----------+----------+--------------+  POP       Full            Yes       Yes                                    +---------+---------------+---------+-----------+----------+--------------+  PTV       Full                                                             +---------+---------------+---------+-----------+----------+--------------+  PERO      Full                                                             +---------+---------------+---------+-----------+----------+--------------+     Summary: Right: There is no evidence of deep vein thrombosis in the lower extremity. No cystic structure found in the popliteal fossa. Left: There is no evidence of deep vein thrombosis in the lower extremity. No cystic structure found in the popliteal fossa.  *See table(s) above for measurements and observations. Electronically signed by Waverly Ferrarihristopher Dickson MD on 03/31/2019 at 5:28:44 PM.    Final        LOS: 8 days   Osvaldo ShipperGokul Mercedees Convery  [facility] Pager on [email or web address]  04/02/2019, 10:50 AM

## 2019-04-02 NOTE — Progress Notes (Signed)
Pt unproned at this time with no complications. VS within normal limits. ETT remains at 26 at the lip and is secure. Commercial tube holder replaced and tape removed. Skin intact with no concerns

## 2019-04-02 NOTE — Progress Notes (Signed)
Pt's head turned at this time with RTx1 and RN x 2. No complications noted.  RT will continue to monitor.

## 2019-04-02 NOTE — Progress Notes (Signed)
Patient remains proned. Head repositioned. Arms rotated. ETT remains secured. 

## 2019-04-02 NOTE — Progress Notes (Signed)
Patient's Tmax=101.6 and climbing despite restarting pt on cooling blanket. CCMD consulted tylenol route change to per rectum. Ice packs added to axilla. Will continue to monitor.

## 2019-04-02 NOTE — Progress Notes (Signed)
Spoke with patient's wife and provided full update/answered all questions.  

## 2019-04-02 NOTE — Progress Notes (Signed)
Patient remains proned. Head repositioned. Arms rotated. ETT remains secured. RT x2, RN at bedside.

## 2019-04-02 NOTE — Progress Notes (Signed)
NAME:  Angel Stuart, MRN:  [id number], DOB:  03/13/1961, LOS: 8 ADMISSION DATE:  03/15/2019, CONSULTATION DATE:  10/26 REFERRING MD:  Sharon SellerMcClung, CHIEF COMPLAINT:  Dyspnea   Brief History   58 y/o male admitted on 10/26 with ARDS from COVID 19 pneumonia [facility].    Past Medical History  HTN HLD DM2  Significant Hospital Events   10/26 admission to ICU, prone position 10/30 culture positive pseudomonas 11/2 worsening oxygenation  Consults:  PCCM  Procedures:  10/26 ETT >  10/26 L subclavian CVL >   Significant Diagnostic Tests:  11/1 Lower extremity doppler ultrasound> negative for DVT  Micro Data:  10/29 blood >  10/30 sputum > pseudomonas, gram negative rods, GPC  Antimicrobials/COVID RX:  10/27 Remdesivir >  10/27 decadron >   10/30 vanc >  10/30 zosyn >   Interim history/subjective:   Persistent fever Back to prone position overnight Low dose levophed  Objective   Blood pressure 93/72, pulse (!) 108, temperature (!) 100.9 F (38.3 C), resp. rate (!) 0, height 5\' 11"  (1.803 m), weight 105.4 kg, SpO2 100 %.    Vent Mode: PRVC FiO2 (%):  [50 %] 50 % Set Rate:  [15 bmp] 15 bmp Vt Set:  [600 mL] 600 mL PEEP:  [10 cmH20-14 cmH20] 14 cmH20 Plateau Pressure:  [21 cmH20-29 cmH20] 29 cmH20   Intake/Output Summary (Last 24 hours) at 04/02/2019 0750 Last data filed at 04/02/2019 0700 Gross per 24 hour  Intake 4107.44 ml  Output 2610 ml  Net 1497.44 ml   Filed Weights   03/31/19 0500 04/01/19 0500 04/02/19 0419  Weight: 107.3 kg 105.3 kg 105.4 kg    Examination:  General:  In bed on vent, prone HENT: NCAT ETT in place PULM: CTA B, vent supported breathing CV: RRR, no mgr GI: BS+, soft, nontender MSK: normal bulk and tone Neuro: sedated on vent   11/2 CXR images personally reviewed> ETT in place, L subclavian CVL in place, bilateral infiltrates left base greater than R   Resolved Hospital Problem list     Assessment & Plan:  ARDS due to COVID 19 pneumonia: oxygenation about the same Continue mechanical ventilation per ARDS protocol Target TVol 6-8cc/kgIBW Target Plateau Pressure < 30cm H20 Target driving pressure less than 15 cm of water Target PaO2 55-65: titrate PEEP/FiO2 per protocol As long as PaO2 to FiO2 ratio is less than 1:150 position in prone position for 16 hours a day Check CVP daily if CVL in place Target CVP less than 4, diurese as necessary Ventilator associated pneumonia prevention protocol 11/2 plan: [facility] given large tidal volumes (around 10-12 cc/kg IBW 11/2 even on minimal pressure control settings) move back to prone position, continue diuresis  Need for sedation for mechanical ventilation RASS target -3 Continue oxycodone, clonazepam, precedex, fentanyl  Pseudomonas pneumonia Continue ceftaz for total antibiotics 7 days  Ongoing fever: no other obvious cause other than pseudomonas infection, no DVT, clinically seems stable to improved today so I doubt untreated infection, ARDS related? Monitor Consider CT Abdomen if still febrile in 2-3 days to assess for less common causes of ICU fever (acalculous cholecystitis, etc)  Best practice:  Diet: continue tube feeding Pain/Anxiety/Delirium protocol (if indicated): yes as above VAP protocol (if indicated): yes DVT prophylaxis: lovenox 0.5mg  sub q bid GI prophylaxis: Pantoprazole for stress ulcer prophylaxis Glucose control: SSI Mobility: bed rest Code Status: Full Family Communication: Updated Melissa today Disposition: remain in ICU  Labs   CBC: Recent Labs  Lab 03/27/19 0445  03/28/19 0450 03/29/19 0050  03/30/19 0430  03/31/19 0500 04/01/19 0521 04/01/19 0605 04/01/19 0950 04/02/19 0400  WBC 11.8*  --  11.3* 14.7*  --  12.4*  --  14.5* 11.2*  --   --  7.1  NEUTROABS 8.6*  --  7.3 11.0*  --  10.2*  --   --   --   --   --   --   HGB 10.0*   < > 10.3* 12.0*   < > 12.3*   < > 12.8* 13.3  13.9 13.9 14.2  HCT 32.1*   < > 33.2* 38.8*   < > 39.3   < > 41.2 44.1 41.0 41.0 49.0  MCV 89.9  --  90.0 89.0  --  88.5  --  90.0 90.4  --   --  95.1  PLT 476*  --  452* 465*  --  389  --  375 418*  --   --  380   < > = values in this interval not displayed.    Basic Metabolic Panel: Recent Labs  Lab 03/28/19 0450 03/29/19 0050  03/30/19 0430  03/31/19 0500 04/01/19 0521 04/01/19 0605 04/01/19 0950 04/02/19 0400  NA 137 136   < > 137   < > 145 146* 148* 148* 150*  K 4.0 4.0   < > 3.3*   < > 4.0 3.8 3.3* 3.4* 4.5  CL 92* 88*  --  94*  --  103 101  --   --  107  CO2 35* 33*  --  28  --  31 29  --   --  34*  GLUCOSE 221* 319*  --  383*  --  193* 155*  --   --  149*  BUN 55* 67*  --  93*  --  91* 103*  --   --  104*  CREATININE 1.70* 1.94*  --  1.84*  --  1.38* 1.55*  --   --  1.61*  CALCIUM 8.9 9.1  --  7.7*  --  8.2* 8.2*  --   --  8.5*  MG 1.7 1.7  --  2.1  --  2.6* 2.7*  --   --   --   PHOS 4.1 4.0  --  3.3  --  2.0* 3.7  --   --   --    < > = values in this interval not displayed.   GFR: Estimated Creatinine Clearance: 61 mL/min (A) (by C-G formula based on SCr of 1.61 mg/dL (H)). Recent Labs  Lab 03/30/19 0430 03/31/19 0500 04/01/19 0521 04/02/19 0400  PROCALCITON 2.82 1.33 1.08 0.50  WBC 12.4* 14.5* 11.2* 7.1    Liver Function Tests: Recent Labs  Lab 03/27/19 0445 03/28/19 0450 03/29/19 0050 03/30/19 0430  AST 39 34 34 31  ALT 21 24 24 24   ALKPHOS 82 73 73 60  BILITOT 0.4 0.4 0.6 1.1  PROT 6.8 7.2 8.0 7.1  ALBUMIN 2.4* 2.5* 2.8* 2.4*   No results for input(s): LIPASE, AMYLASE in the last 168 hours. No results for input(s): AMMONIA in the last 168 hours.  ABG    Component Value Date/Time   PHART 7.430 04/01/2019 0950   PCO2ART 44.2 04/01/2019 0950   PO2ART 63.0 (L) 04/01/2019 0950   HCO3 29.4 (H) 04/01/2019 0950   TCO2 31 04/01/2019 0950   ACIDBASEDEF 3.0 (H) 03/19/2019 1442   O2SAT 92.0 04/01/2019  0950     Coagulation Profile: No results for  input(s): INR, PROTIME in the last 168 hours.  Cardiac Enzymes: No results for input(s): CKTOTAL, CKMB, CKMBINDEX, TROPONINI in the last 168 hours.  HbA1C: Hgb A1c MFr Bld  Date/Time Value Ref Range Status  03/01/2019 01:00 PM 7.5 (H) 4.8 - 5.6 % Final    Comment:    (NOTE) Pre diabetes:          5.7%-6.4% Diabetes:              >6.4% Glycemic control for   <7.0% adults with diabetes     CBG: Recent Labs  Lab 04/01/19 2339 04/02/19 0042 04/02/19 0245 04/02/19 0442 04/02/19 0646  GLUCAP 151* 146* 146* 144* 139*     Critical care time: 35 minutes    Roselie Awkward, MD [place] [facility] Pager: [phone] Cell: [phone] If no response, call [phone]

## 2019-04-03 ENCOUNTER — Inpatient Hospital Stay (HOSPITAL_COMMUNITY): Payer: Self-pay

## 2019-04-03 LAB — HEPATIC FUNCTION PANEL
ALT: 182 U/L — ABNORMAL HIGH (ref 0–44)
AST: 100 U/L — ABNORMAL HIGH (ref 15–41)
Albumin: 2.1 g/dL — ABNORMAL LOW (ref 3.5–5.0)
Alkaline Phosphatase: 49 U/L (ref 38–126)
Bilirubin, Direct: 0.1 mg/dL (ref 0.0–0.2)
Indirect Bilirubin: 0.8 mg/dL (ref 0.3–0.9)
Total Bilirubin: 0.9 mg/dL (ref 0.3–1.2)
Total Protein: 6 g/dL — ABNORMAL LOW (ref 6.5–8.1)

## 2019-04-03 LAB — GLUCOSE, CAPILLARY
Glucose-Capillary: 145 mg/dL — ABNORMAL HIGH (ref 70–99)
Glucose-Capillary: 145 mg/dL — ABNORMAL HIGH (ref 70–99)
Glucose-Capillary: 147 mg/dL — ABNORMAL HIGH (ref 70–99)
Glucose-Capillary: 148 mg/dL — ABNORMAL HIGH (ref 70–99)
Glucose-Capillary: 152 mg/dL — ABNORMAL HIGH (ref 70–99)
Glucose-Capillary: 155 mg/dL — ABNORMAL HIGH (ref 70–99)
Glucose-Capillary: 161 mg/dL — ABNORMAL HIGH (ref 70–99)
Glucose-Capillary: 162 mg/dL — ABNORMAL HIGH (ref 70–99)
Glucose-Capillary: 164 mg/dL — ABNORMAL HIGH (ref 70–99)
Glucose-Capillary: 164 mg/dL — ABNORMAL HIGH (ref 70–99)
Glucose-Capillary: 165 mg/dL — ABNORMAL HIGH (ref 70–99)
Glucose-Capillary: 169 mg/dL — ABNORMAL HIGH (ref 70–99)
Glucose-Capillary: 171 mg/dL — ABNORMAL HIGH (ref 70–99)
Glucose-Capillary: 172 mg/dL — ABNORMAL HIGH (ref 70–99)
Glucose-Capillary: 181 mg/dL — ABNORMAL HIGH (ref 70–99)
Glucose-Capillary: 201 mg/dL — ABNORMAL HIGH (ref 70–99)
Glucose-Capillary: 215 mg/dL — ABNORMAL HIGH (ref 70–99)
Glucose-Capillary: 384 mg/dL — ABNORMAL HIGH (ref 70–99)

## 2019-04-03 LAB — POCT I-STAT 7, (LYTES, BLD GAS, ICA,H+H)
Acid-Base Excess: 10 mmol/L — ABNORMAL HIGH (ref 0.0–2.0)
Bicarbonate: 37.2 mmol/L — ABNORMAL HIGH (ref 20.0–28.0)
Calcium, Ion: 1.21 mmol/L (ref 1.15–1.40)
HCT: 41 % (ref 39.0–52.0)
Hemoglobin: 13.9 g/dL (ref 13.0–17.0)
O2 Saturation: 96 %
Patient temperature: 36.8
Potassium: 3.8 mmol/L (ref 3.5–5.1)
Sodium: 152 mmol/L — ABNORMAL HIGH (ref 135–145)
TCO2: 39 mmol/L — ABNORMAL HIGH (ref 22–32)
pCO2 arterial: 57 mmHg — ABNORMAL HIGH (ref 32.0–48.0)
pH, Arterial: 7.422 (ref 7.350–7.450)
pO2, Arterial: 83 mmHg (ref 83.0–108.0)

## 2019-04-03 LAB — CBC
HCT: 45.2 % (ref 39.0–52.0)
Hemoglobin: 13.5 g/dL (ref 13.0–17.0)
MCH: 28.1 pg (ref 26.0–34.0)
MCHC: 29.9 g/dL — ABNORMAL LOW (ref 30.0–36.0)
MCV: 94 fL (ref 80.0–100.0)
Platelets: 371 10*3/uL (ref 150–400)
RBC: 4.81 MIL/uL (ref 4.22–5.81)
RDW: 13.8 % (ref 11.5–15.5)
WBC: 6.6 10*3/uL (ref 4.0–10.5)
nRBC: 0 % (ref 0.0–0.2)

## 2019-04-03 LAB — CULTURE, RESPIRATORY W GRAM STAIN

## 2019-04-03 LAB — D-DIMER, QUANTITATIVE: D-Dimer, Quant: 0.71 ug/mL-FEU — ABNORMAL HIGH (ref 0.00–0.50)

## 2019-04-03 LAB — BASIC METABOLIC PANEL
Anion gap: 9 (ref 5–15)
BUN: 96 mg/dL — ABNORMAL HIGH (ref 6–20)
CO2: 34 mmol/L — ABNORMAL HIGH (ref 22–32)
Calcium: 8.3 mg/dL — ABNORMAL LOW (ref 8.9–10.3)
Chloride: 108 mmol/L (ref 98–111)
Creatinine, Ser: 1.34 mg/dL — ABNORMAL HIGH (ref 0.61–1.24)
GFR calc Af Amer: >60 mL/min (ref >60)
GFR calc non Af Amer: 58 mL/min — ABNORMAL LOW (ref >60)
Glucose, Bld: 161 mg/dL — ABNORMAL HIGH (ref 70–99)
Potassium: 3.6 mmol/L (ref 3.5–5.1)
Sodium: 151 mmol/L — ABNORMAL HIGH (ref 135–145)

## 2019-04-03 LAB — CULTURE, BLOOD (ROUTINE X 2)
Culture: NO GROWTH
Culture: NO GROWTH
Special Requests: ADEQUATE
Special Requests: ADEQUATE

## 2019-04-03 LAB — C-REACTIVE PROTEIN: CRP: 0.9 mg/dL (ref <1.0)

## 2019-04-03 MED ORDER — INSULIN ASPART 100 UNIT/ML ~~LOC~~ SOLN
0.0000 [IU] | SUBCUTANEOUS | Status: DC
  Administered 2019-04-03: 20 [IU] via SUBCUTANEOUS
  Administered 2019-04-03: 20:00:00 3 [IU] via SUBCUTANEOUS
  Administered 2019-04-04 (×2): 11 [IU] via SUBCUTANEOUS
  Administered 2019-04-04 (×3): 20 [IU] via SUBCUTANEOUS
  Administered 2019-04-04: 15 [IU] via SUBCUTANEOUS
  Administered 2019-04-05: 7 [IU] via SUBCUTANEOUS
  Administered 2019-04-05 (×2): 11 [IU] via SUBCUTANEOUS
  Administered 2019-04-05: 15 [IU] via SUBCUTANEOUS
  Administered 2019-04-05 (×2): 11 [IU] via SUBCUTANEOUS
  Administered 2019-04-06: 3 [IU] via SUBCUTANEOUS
  Administered 2019-04-06 (×2): 7 [IU] via SUBCUTANEOUS
  Administered 2019-04-06 – 2019-04-07 (×3): 4 [IU] via SUBCUTANEOUS
  Administered 2019-04-07 (×2): 3 [IU] via SUBCUTANEOUS
  Administered 2019-04-07 – 2019-04-09 (×8): 4 [IU] via SUBCUTANEOUS
  Administered 2019-04-09 (×2): 7 [IU] via SUBCUTANEOUS
  Administered 2019-04-09: 4 [IU] via SUBCUTANEOUS
  Administered 2019-04-09: 3 [IU] via SUBCUTANEOUS
  Administered 2019-04-10: 4 [IU] via SUBCUTANEOUS
  Administered 2019-04-10: 3 [IU] via SUBCUTANEOUS
  Administered 2019-04-10: 4 [IU] via SUBCUTANEOUS
  Administered 2019-04-10: 3 [IU] via SUBCUTANEOUS
  Administered 2019-04-10: 4 [IU] via SUBCUTANEOUS
  Administered 2019-04-11 – 2019-04-12 (×5): 3 [IU] via SUBCUTANEOUS
  Administered 2019-04-12: 4 [IU] via SUBCUTANEOUS
  Administered 2019-04-12: 20:00:00 3 [IU] via SUBCUTANEOUS
  Administered 2019-04-13 (×2): 4 [IU] via SUBCUTANEOUS
  Administered 2019-04-13: 17:00:00 7 [IU] via SUBCUTANEOUS
  Administered 2019-04-13: 01:00:00 3 [IU] via SUBCUTANEOUS
  Administered 2019-04-14: 01:00:00 4 [IU] via SUBCUTANEOUS
  Administered 2019-04-14 – 2019-04-15 (×8): 3 [IU] via SUBCUTANEOUS
  Administered 2019-04-16: 20:00:00 11 [IU] via SUBCUTANEOUS
  Administered 2019-04-16: 12:00:00 3 [IU] via SUBCUTANEOUS
  Administered 2019-04-16: 03:00:00 4 [IU] via SUBCUTANEOUS
  Administered 2019-04-16 – 2019-04-17 (×2): 7 [IU] via SUBCUTANEOUS
  Administered 2019-04-17: 13:00:00 4 [IU] via SUBCUTANEOUS
  Administered 2019-04-17 (×2): 11 [IU] via SUBCUTANEOUS
  Administered 2019-04-17: 15:00:00 7 [IU] via SUBCUTANEOUS
  Administered 2019-04-17 – 2019-04-18 (×2): 4 [IU] via SUBCUTANEOUS
  Administered 2019-04-18 (×2): 3 [IU] via SUBCUTANEOUS
  Administered 2019-04-18 (×2): 4 [IU] via SUBCUTANEOUS
  Administered 2019-04-19: 3 [IU] via SUBCUTANEOUS
  Administered 2019-04-19 (×2): 4 [IU] via SUBCUTANEOUS
  Administered 2019-04-19: 3 [IU] via SUBCUTANEOUS
  Administered 2019-04-19 – 2019-04-20 (×5): 4 [IU] via SUBCUTANEOUS
  Administered 2019-04-20: 3 [IU] via SUBCUTANEOUS
  Administered 2019-04-20 (×2): 4 [IU] via SUBCUTANEOUS
  Administered 2019-04-21: 7 [IU] via SUBCUTANEOUS
  Administered 2019-04-21 (×2): 4 [IU] via SUBCUTANEOUS
  Administered 2019-04-21 – 2019-04-22 (×4): 7 [IU] via SUBCUTANEOUS
  Administered 2019-04-22: 4 [IU] via SUBCUTANEOUS
  Administered 2019-04-22: 7 [IU] via SUBCUTANEOUS
  Administered 2019-04-22: 15 [IU] via SUBCUTANEOUS
  Administered 2019-04-22: 11 [IU] via SUBCUTANEOUS
  Administered 2019-04-22: 7 [IU] via SUBCUTANEOUS
  Administered 2019-04-23: 4 [IU] via SUBCUTANEOUS
  Administered 2019-04-23: 7 [IU] via SUBCUTANEOUS
  Administered 2019-04-23: 4 [IU] via SUBCUTANEOUS
  Administered 2019-04-23: 13:00:00 11 [IU] via SUBCUTANEOUS
  Administered 2019-04-23 (×2): 4 [IU] via SUBCUTANEOUS
  Administered 2019-04-24: 20:00:00 3 [IU] via SUBCUTANEOUS
  Administered 2019-04-24 (×2): 7 [IU] via SUBCUTANEOUS
  Administered 2019-04-24 – 2019-04-25 (×5): 4 [IU] via SUBCUTANEOUS
  Administered 2019-04-25: 11 [IU] via SUBCUTANEOUS
  Administered 2019-04-26: 13:00:00 3 [IU] via SUBCUTANEOUS
  Administered 2019-04-26: 01:00:00 7 [IU] via SUBCUTANEOUS
  Administered 2019-04-26: 05:00:00 3 [IU] via SUBCUTANEOUS
  Administered 2019-04-26: 4 [IU] via SUBCUTANEOUS
  Administered 2019-04-26 – 2019-04-27 (×4): 3 [IU] via SUBCUTANEOUS
  Administered 2019-04-27: 4 [IU] via SUBCUTANEOUS
  Administered 2019-04-27: 01:00:00 3 [IU] via SUBCUTANEOUS
  Administered 2019-04-27: 4 [IU] via SUBCUTANEOUS
  Administered 2019-04-28 (×3): 3 [IU] via SUBCUTANEOUS
  Administered 2019-04-29: 7 [IU] via SUBCUTANEOUS
  Administered 2019-04-29: 15 [IU] via SUBCUTANEOUS
  Administered 2019-04-29: 11 [IU] via SUBCUTANEOUS
  Administered 2019-04-29 (×2): 7 [IU] via SUBCUTANEOUS
  Administered 2019-04-30: 15 [IU] via SUBCUTANEOUS
  Administered 2019-04-30 (×3): 11 [IU] via SUBCUTANEOUS

## 2019-04-03 MED ORDER — INSULIN ASPART 100 UNIT/ML ~~LOC~~ SOLN: 0.0000 [IU] | SUBCUTANEOUS | Status: DC

## 2019-04-03 MED ORDER — PRO-STAT SUGAR FREE PO LIQD
60.0000 mL | Freq: Three times a day (TID) | ORAL | Status: DC
  Administered 2019-04-03 – 2019-04-09 (×18): 60 mL
  Filled 2019-04-03 (×17): qty 60

## 2019-04-03 MED ORDER — MIDODRINE HCL 5 MG PO TABS
5.0000 mg | ORAL_TABLET | Freq: Three times a day (TID) | ORAL | Status: DC
  Administered 2019-04-03 – 2019-04-04 (×4): 5 mg
  Filled 2019-04-03 (×6): qty 1

## 2019-04-03 MED ORDER — INSULIN REGULAR(HUMAN) IN NACL 100-0.9 UT/100ML-% IV SOLN
INTRAVENOUS | Status: AC
  Administered 2019-04-03: 17:00:00 12 [IU]/h via INTRAVENOUS
  Filled 2019-04-03: qty 100

## 2019-04-03 MED ORDER — HYDROMORPHONE BOLUS VIA INFUSION
0.5000 mg | INTRAVENOUS | Status: DC | PRN
  Administered 2019-04-06 – 2019-04-21 (×16): 0.5 mg via INTRAVENOUS
  Filled 2019-04-03: qty 1

## 2019-04-03 MED ORDER — ACETAMINOPHEN 160 MG/5ML PO SOLN
650.0000 mg | Freq: Four times a day (QID) | ORAL | Status: DC | PRN
  Administered 2019-04-03 – 2019-04-26 (×36): 650 mg
  Filled 2019-04-03 (×36): qty 20.3

## 2019-04-03 MED ORDER — MIDODRINE HCL 5 MG PO TABS
5.0000 mg | ORAL_TABLET | Freq: Three times a day (TID) | ORAL | Status: DC
  Administered 2019-04-03 (×2): 5 mg via ORAL
  Filled 2019-04-03 (×5): qty 1

## 2019-04-03 MED ORDER — SODIUM CHLORIDE 0.9 % IV SOLN
0.5000 mg/h | INTRAVENOUS | Status: DC
  Administered 2019-04-03: 1 mg/h via INTRAVENOUS
  Administered 2019-04-04: 1.5 mg/h via INTRAVENOUS
  Administered 2019-04-06: 19:00:00 1 mg/h via INTRAVENOUS
  Administered 2019-04-07 – 2019-04-08 (×2): 4 mg/h via INTRAVENOUS
  Administered 2019-04-08: 3.5 mg/h via INTRAVENOUS
  Administered 2019-04-09 – 2019-04-10 (×3): 4 mg/h via INTRAVENOUS
  Administered 2019-04-11 – 2019-04-13 (×4): 3 mg/h via INTRAVENOUS
  Administered 2019-04-13: 17:00:00 4 mg/h via INTRAVENOUS
  Administered 2019-04-14: 19:00:00 3.5 mg/h via INTRAVENOUS
  Administered 2019-04-14: 07:00:00 4 mg/h via INTRAVENOUS
  Administered 2019-04-15: 11:00:00 3.5 mg/h via INTRAVENOUS
  Administered 2019-04-16: 01:00:00 3 mg/h via INTRAVENOUS
  Administered 2019-04-16 – 2019-04-21 (×9): 4 mg/h via INTRAVENOUS
  Administered 2019-04-22: 05:00:00 3.5 mg/h via INTRAVENOUS
  Filled 2019-04-03 (×33): qty 5

## 2019-04-03 MED ORDER — VITAL 1.5 CAL PO LIQD
1000.0000 mL | ORAL | Status: DC
  Administered 2019-04-03 – 2019-04-08 (×6): 1000 mL
  Filled 2019-04-03 (×15): qty 1000

## 2019-04-03 MED ORDER — INSULIN GLARGINE 100 UNIT/ML ~~LOC~~ SOLN
60.0000 [IU] | Freq: Every day | SUBCUTANEOUS | Status: DC
  Filled 2019-04-03: qty 0.6

## 2019-04-03 MED ORDER — INSULIN GLARGINE 100 UNIT/ML ~~LOC~~ SOLN
60.0000 [IU] | Freq: Every day | SUBCUTANEOUS | Status: DC
  Administered 2019-04-03 – 2019-04-04 (×2): 60 [IU] via SUBCUTANEOUS
  Filled 2019-04-03 (×3): qty 0.6

## 2019-04-03 NOTE — Progress Notes (Signed)
[facility] PCCM  I called his wife [name] and updated her about his condition.  Roselie Awkward, MD [place] [facility] Pager: [phone] Cell: [phone] If no response, call [phone]

## 2019-04-03 NOTE — Progress Notes (Signed)
Inpatient Diabetes Program Recommendations  AACE/ADA: New Consensus Statement on Inpatient Glycemic Control (2015)  Target Ranges:  Prepandial:   less than 140 mg/dL      Peak postprandial:   less than 180 mg/dL (1-2 hours)      Critically ill patients:  140 - 180 mg/dL   Lab Results  Component Value Date   GLUCAP 171 (H) 04/03/2019   HGBA1C 7.5 (H) 04/23/2019    Review of Glycemic Control  Current orders for Inpatient glycemic control: IV insulin gtt  Decadron 6 mg Q12 hours Vital AF 1.2 70 ml/hour Creatinine 1.34 BUN  96  Inpatient Diabetes Program Recommendations:    IV insulin gtt rate 6-8 units/hour still. Pt intubated and critically ill. Could utilize COVID Glycemic control order set if desired for transition.  Suggested by order set based on last IV gtt rate (7.1 units/hour): Levemir 34 units bid Novolog 11 units Q4 Tube Feed Coverage and desired correction scale  Thanks,  Tama Headings RN, MSN, BC-ADM Inpatient Diabetes Coordinator Team Pager [phone] (8a-5p)

## 2019-04-03 NOTE — Progress Notes (Signed)
Wasted 40 mL of Fentanyl in the Steri with second RN, Lyla Son.

## 2019-04-03 NOTE — Progress Notes (Signed)
Nutrition Follow-up RD working remotely.  DOCUMENTATION CODES:   Obesity unspecified  INTERVENTION:    Vital 1.5 at 80 ml/h x 8 hours per day (640 ml per day)   Vital 1.5 at 40 ml/h x 16 hours per day while proning (640 ml per day)   Pro-stat 60 ml TID   Provides 2520 kcal, 176 gm protein, 978 ml free water daily  NUTRITION DIAGNOSIS:   Increased nutrient needs related to acute illness as evidenced by estimated needs.  Ongoing   GOAL:   Provide needs based on ASPEN/SCCM guidelines   Unmet  MONITOR:   Vent status, Labs, I & O's, TF tolerance  ASSESSMENT:   58 yo male admitted with progressive SOB after testing positive for COVID-19 on 10/9 (symptoms started 10/5). PMH includes DM, HLD.   Cortrak feeding tube placed 10/28, tip is postpyloric. Currently receiving Vital AF 1.2 at 70 ml/h with Pro-stat 30 ml BID. Free water flushes 300 ml every 4 hours. TF rate is being decreased to 40 ml/h during proning. Patient is not receiving full volume tube feedings.  Patient remains intubated on ventilator support. MV: 11.6 L/min Temp (24hrs), Avg:98.6 F (37 C), Min:91.2 F (32.9 C), Max:101.1 F (38.4 C)   Labs reviewed. Sodium 151 CBG's: [id number]  Medications reviewed and include levophed, decadron, IV insulin.  I/O -3.9 L since admission  NUTRITION - FOCUSED PHYSICAL EXAM:  deferred  Diet Order:   Diet Order            Diet NPO time specified  Diet effective now              EDUCATION NEEDS:   Not appropriate for education at this time  Skin:  Skin Assessment: Reviewed RN Assessment  Last BM:  no BM documented  Height:   Ht Readings from Last 1 Encounters:  07-Apr-2019 5\' 11"  (1.803 m)    Weight:   Wt Readings from Last 1 Encounters:  04/03/19 105.2 kg    Ideal Body Weight:  78.2 kg  BMI:  Body mass index is 32.35 kg/m.  Estimated Nutritional Needs:   Kcal:  2700  Protein:  >/= 156 gm  Fluid:  >/= 1.8 L    Molli Barrows, RD, LDN, [place] Pager [phone] After Hours Pager [phone]

## 2019-04-03 NOTE — Progress Notes (Addendum)
Angel RainRichard Craig Stuart  WUJ:[id number] DOB: 08/09/1960 DOA: 06-13-18 PCP: Lenox PondsSilva Zapata, Edwin, MD    Brief Narrative:  58 year old with a history of hyperlipidemia and DM 2 who began to develop upper respiratory symptoms on 10/5, and ultimately tested positive for Covid on 10/9.  Records suggest he experienced a gradual progression of symptoms with acute worsening of shortness of breath on 10/25, which prompted him [facility].  In the [facility] he was noted to have a saturation of 63% on room air.  Chest x-ray noted severe bilateral infiltrates.  He rapidly decompensated in the emergency room.  He failed a trial of BiPAP and had to be intubated.  [facility]    Significant Events: 10/05 symptom onset  10/09 positive COVID test 10/25 [facility] - [facility]  [facility] 11/1 bilateral lower extremity venous duplex negative for DVT  COVID-19 specific Treatment: Decadron 10/26 > 11/4 Remdesivir 10/26 > 10/30 Convalescent plasma 10/27  Subjective: Sedated. On ventilator in prone position.  Has shown some improvement in oxygenation over the last 24 hours.  Appears somewhat tachypneic despite ventilator assistance.  Assessment & Plan:  Covid pneumonia -ARDS -acute hypoxic respiratory failure Ventilator management per PCCM - Decadron and remdesivir courses have been completed - transfused convalescent plasma 10/27 AM  Recent Labs  Lab 03/28/19 0450 03/29/19 0050 03/29/19 1407 03/30/19 0430 03/31/19 0500 04/01/19 0521 04/02/19 0400 04/03/19 0645  DDIMER 3.10* 2.61*  --  2.26* 2.34* 1.61* 1.46* 0.71*  FERRITIN 289 372*  --  651*  --   --   --   --   CRP 5.2* 3.8*  --  18.7* 13.0* 5.4* 2.3* 0.9  ALT 24 24  --  24  --   --   --  182*  PROCALCITON  --   --  4.01 2.82 1.33 1.08 0.50  --     Pseudomonas on tracheal aspirate To complete 7 days of antibiotic therapy  Sinus tachycardia  low Felt to be related to agitation -sedative being changed by critical care  Relapsing fevers Likely due to Pseudomonas -follow trend without change today  Hypotension - Septic shock Due to above -pressors per PCCM   Acute renal failure Creatinine appears to be improving -continue to follow trend  Recent Labs  Lab 03/30/19 0430 03/31/19 0500 04/01/19 0521 04/02/19 0400 04/03/19 0645  CREATININE 1.84* 1.38* 1.55* 1.61* 1.34*    DM 2 A1c 7.5 -CBG presently within target range -transition off IV insulin drip  Mild transaminitis A new finding today -follow  Obesity - Estimated body mass index is 32.35 kg/m as calculated from the following:   Height as of this encounter: 5\' 11"  (1.803 m).   Weight as of this encounter: 105.2 kg.   DVT prophylaxis: lovenox  Code Status: FULL CODE Family Communication: Per PCCM Disposition Plan: ICU  Consultants:  none  Antimicrobials:  Vancomycin 10/30 > 11/2 Zosyn 10/30 > 11/2 Elita QuickFortaz 11/2 >  Objective: Blood pressure 104/68, pulse (!) 121, temperature 97.7 F (36.5 C), resp. rate (!) 25, height 5\' 11"  (1.803 m), weight 105.2 kg, SpO2 100 %.  Intake/Output Summary (Last 24 hours) at 04/03/2019 1502 Last data filed at 04/03/2019 1400 Gross per 24 hour  Intake 2688.32 ml  Output 1995 ml  Net 693.32 ml   Filed Weights   04/01/19 0500 04/02/19 0419 04/03/19 0645  Weight: 105.3 kg 105.4 kg 105.2 kg  Examination: General: In prone position - sedated -some tachypnea Lungs: Fine diffuse crackles Cardiovascular: Tachycardic but regular Abdomen: Obese, soft Extremities: No significant edema B LE   CBC: Recent Labs  Lab 03/28/19 0450 03/29/19 0050  03/30/19 0430  04/01/19 0521  04/02/19 0400 04/02/19 1516 04/03/19 0326 04/03/19 0645  WBC 11.3* 14.7*  --  12.4*   < > 11.2*  --  7.1  --   --  6.6  NEUTROABS 7.3 11.0*  --  10.2*  --   --   --   --   --   --   --   HGB 10.3* 12.0*   < > 12.3*   < > 13.3   < > 14.2 13.9  13.9 13.5  HCT 33.2* 38.8*   < > 39.3   < > 44.1   < > 49.0 41.0 41.0 45.2  MCV 90.0 89.0  --  88.5   < > 90.4  --  95.1  --   --  94.0  PLT 452* 465*  --  389   < > 418*  --  380  --   --  371   < > = values in this interval not displayed.   Basic Metabolic Panel: Recent Labs  Lab 03/30/19 0430  03/31/19 0500 04/01/19 0521  04/02/19 0400 04/02/19 1516 04/03/19 0326 04/03/19 0645  NA 137   < > 145 146*   < > 150* 150* 152* 151*  K 3.3*   < > 4.0 3.8   < > 4.5 4.8 3.8 3.6  CL 94*  --  103 101  --  107  --   --  108  CO2 28  --  31 29  --  34*  --   --  34*  GLUCOSE 383*  --  193* 155*  --  149*  --   --  161*  BUN 93*  --  91* 103*  --  104*  --   --  96*  CREATININE 1.84*  --  1.38* 1.55*  --  1.61*  --   --  1.34*  CALCIUM 7.7*  --  8.2* 8.2*  --  8.5*  --   --  8.3*  MG 2.1  --  2.6* 2.7*  --   --   --   --   --   PHOS 3.3  --  2.0* 3.7  --   --   --   --   --    < > = values in this interval not displayed.   GFR: Estimated Creatinine Clearance: 73.3 mL/min (A) (by C-G formula based on SCr of 1.34 mg/dL (H)).  Liver Function Tests: Recent Labs  Lab 03/28/19 0450 03/29/19 0050 03/30/19 0430 04/03/19 0645  AST 34 34 31 100*  ALT 24 24 24  182*  ALKPHOS 73 73 60 49  BILITOT 0.4 0.6 1.1 0.9  PROT 7.2 8.0 7.1 6.0*  ALBUMIN 2.5* 2.8* 2.4* 2.1*    HbA1C: Hgb A1c MFr Bld  Date/Time Value Ref Range Status  04/08/19 01:00 PM 7.5 (H) 4.8 - 5.6 % Final    Comment:    (NOTE) Pre diabetes:          5.7%-6.4% Diabetes:              >6.4% Glycemic control for   <7.0% adults with diabetes     CBG: Recent Labs  Lab 04/03/19 1011 04/03/19 1114 04/03/19 1213 04/03/19 1324 04/03/19 1425  GLUCAP  145* 152* 145* 164* 172*    Recent Results (from the past 240 hour(s))  MRSA PCR Screening     Status: None   Collection Time: 03/23/2019  8:57 PM   Specimen: Nasal Mucosa; Nasopharyngeal  Result Value Ref Range Status   MRSA by PCR NEGATIVE NEGATIVE Final    Comment:         The GeneXpert MRSA Assay (FDA approved for NASAL specimens only), is one component of a comprehensive MRSA colonization surveillance program. It is not intended to diagnose MRSA infection nor to guide or monitor treatment for MRSA infections. Performed at [facility], [address] [place], [place] [address]   Culture, blood (routine x 2)     Status: None   Collection Time: 03/28/19 11:02 PM   Specimen: BLOOD  Result Value Ref Range Status   Specimen Description   Final    BLOOD RIGHT ANTECUBITAL Performed at [facility], [address] [place], [place] [address]    Special Requests   Final    BOTTLES DRAWN AEROBIC AND ANAEROBIC Blood Culture adequate volume Performed at [facility], [address] [place], [place] [address]    Culture   Final    NO GROWTH 5 DAYS Performed at [facility], [address] [place], [place] [address]    Report Status 04/03/2019 FINAL  Final  Culture, blood (routine x 2)     Status: None   Collection Time: 03/28/19 11:40 PM   Specimen: BLOOD LEFT HAND  Result Value Ref Range Status   Specimen Description   Final    BLOOD LEFT HAND Performed at [facility], [address], [place], [place] [address]    Special Requests   Final    BOTTLES DRAWN AEROBIC AND ANAEROBIC Blood Culture adequate volume Performed at [facility], [address], [place], [place] [address]    Culture   Final    NO GROWTH 5 DAYS Performed at [facility], [address], [place], [place] [address]    Report Status 04/03/2019 FINAL  Final  Expectorated sputum assessment w rflx to resp cult     Status: None   Collection Time: 03/29/19 12:50 AM   Specimen: Expectorated Sputum  Result Value Ref Range Status   Specimen Description EXPECTORATED SPUTUM  Final   Special Requests Immunocompromised  Final   Sputum evaluation   Final    THIS SPECIMEN IS  ACCEPTABLE FOR SPUTUM CULTURE Performed at [facility], [address] [place], [place] [address]    Report Status 03/29/2019 FINAL  Final  Culture, respiratory     Status: None   Collection Time: 03/29/19 12:50 AM  Result Value Ref Range Status   Specimen Description   Final    EXPECTORATED SPUTUM Performed at [facility], [address], [place], [place] [address]    Special Requests   Final    Immunocompromised Reflexed from [id number] Performed at [facility], [address], [place], [place] [address]    Gram Stain   Final    MODERATE WBC PRESENT, PREDOMINANTLY PMN ABUNDANT GRAM NEGATIVE RODS MODERATE GRAM POSITIVE COCCI Performed at [facility], [address] [place], [place] [address]    Culture   Final    MODERATE PSEUDOMONAS AERUGINOSA MODERATE STAPHYLOCOCCUS AUREUS    Report Status 04/03/2019 FINAL  Final   Organism ID, Bacteria PSEUDOMONAS AERUGINOSA  Final  Organism ID, Bacteria STAPHYLOCOCCUS AUREUS  Final      Susceptibility   Pseudomonas aeruginosa - MIC*    CEFTAZIDIME 4 SENSITIVE Sensitive     CIPROFLOXACIN <=0.25 SENSITIVE Sensitive     GENTAMICIN 4 SENSITIVE Sensitive     IMIPENEM 2 SENSITIVE Sensitive     PIP/TAZO 16 SENSITIVE Sensitive     CEFEPIME 4 SENSITIVE Sensitive     * MODERATE PSEUDOMONAS AERUGINOSA   Staphylococcus aureus - MIC*    CIPROFLOXACIN <=0.5 SENSITIVE Sensitive     ERYTHROMYCIN <=0.25 SENSITIVE Sensitive     GENTAMICIN <=0.5 SENSITIVE Sensitive     OXACILLIN <=0.25 SENSITIVE Sensitive     TETRACYCLINE <=1 SENSITIVE Sensitive     VANCOMYCIN <=0.5 SENSITIVE Sensitive     TRIMETH/SULFA <=10 SENSITIVE Sensitive     CLINDAMYCIN <=0.25 SENSITIVE Sensitive     RIFAMPIN <=0.5 SENSITIVE Sensitive     Inducible Clindamycin NEGATIVE Sensitive     * MODERATE STAPHYLOCOCCUS AUREUS     Scheduled Meds: . chlorhexidine  15 mL Mouth/Throat BID  . Chlorhexidine Gluconate Cloth   6 each Topical Daily  . clonazePAM  1 mg Per Tube BID  . enoxaparin (LOVENOX) injection  55 mg Subcutaneous Q12H  . feeding supplement (PRO-STAT SUGAR FREE 64)  60 mL Per Tube TID  . free water  300 mL Per Tube Q4H  . mouth rinse  15 mL Mouth Rinse 10 times per day  . midodrine  5 mg Oral TID PC  . oxyCODONE  10 mg Per Tube Q6H  . pantoprazole sodium  40 mg Per Tube Daily  . QUEtiapine  25 mg Oral BID   Continuous Infusions: . cefTAZidime (FORTAZ)  IV Stopped (04/03/19 2841)  . dexmedetomidine (PRECEDEX) IV infusion 0.4 mcg/kg/hr (04/03/19 1400)  . dextrose    . feeding supplement (VITAL 1.5 CAL) 1,000 mL (04/03/19 1100)  . HYDROmorphone 1 mg/hr (04/03/19 1244)  . insulin 8.4 Units/hr (04/03/19 1415)  . norepinephrine (LEVOPHED) Adult infusion Stopped (04/03/19 0945)     LOS: 9 days   Cherene Altes, MD [facility] Office  [phone] Pager - Text Page per Amion  If 7PM-7AM, please contact night-coverage per Amion 04/03/2019, 3:02 PM

## 2019-04-03 NOTE — Progress Notes (Signed)
Pt lavaged with 10cc saline per MD order. Copious amounts of yellow thick secretions suctioned out. Pt stable throughout with no complications. VS within normal limits.

## 2019-04-03 NOTE — Progress Notes (Addendum)
NAME:  Angel Stuart, MRN:  [id number], DOB:  1960/10/07, LOS: 9 ADMISSION DATE:  03/09/2019, CONSULTATION DATE:  10/26 REFERRING MD:  Thereasa Solo, CHIEF COMPLAINT:  Dyspnea   Brief History   58 y/o male admitted on 10/26 with ARDS from [place] 19 pneumonia required intubation in the [facility] ED.    Past Medical History  HTN HLD DM2  Significant Hospital Events   10/26 admission to ICU, prone position 10/30 culture positive pseudomonas 11/2 worsening oxygenation  Consults:  PCCM  Procedures:  10/26 ETT >  10/26 L subclavian CVL >   Significant Diagnostic Tests:  11/1 Lower extremity doppler ultrasound> negative for DVT  Micro Data:  10/29 blood >  10/30 sputum > pseudomonas, gram negative rods, GPC  Antimicrobials/COVID RX:  10/27 Remdesivir >  10/27 decadron >   10/30 vanc > 11/2 10/30 zosyn > 11/2  11/2 ceftaz >   Interim history/subjective:   Oxygenation improved overnight  Objective   Blood pressure 120/65, pulse 100, temperature (!) 96.4 F (35.8 C), resp. rate 17, height 5\' 11"  (1.803 m), weight 105.2 kg, SpO2 100 %. CVP:  [10 mmHg-15 mmHg] 10 mmHg  Vent Mode: PRVC FiO2 (%):  [40 %-50 %] 40 % Set Rate:  [15 bmp] 15 bmp Vt Set:  [600 mL] 600 mL PEEP:  [10 cmH20-14 cmH20] 10 cmH20 Plateau Pressure:  [15 cmH20-28 cmH20] 20 cmH20   Intake/Output Summary (Last 24 hours) at 04/03/2019 4854 Last data filed at 04/03/2019 6270 Gross per 24 hour  Intake 2565.59 ml  Output 2865 ml  Net -299.41 ml   Filed Weights   04/01/19 0500 04/02/19 0419 04/03/19 0645  Weight: 105.3 kg 105.4 kg 105.2 kg    Examination:  General:  In bed on vent, prone HENT: NCAT ETT in place PULM: Crackles bases B, vent supported breathing CV: RRR, no mgr GI: BS+, soft, nontender MSK: normal bulk and tone Neuro: sedated on vent  11/2 CXR images personally reviewed> ETT in place, L subclavian CVL in place, bilateral infiltrates left base greater than R   Resolved  Hospital Problem list     Assessment & Plan:  ARDS due to COVID 19 pneumonia: oxygenation about the same Continue mechanical ventilation per ARDS protocol Target TVol 6-8cc/kgIBW Target Plateau Pressure < 30cm H20 Target driving pressure less than 15 cm of water Target PaO2 55-65: titrate PEEP/FiO2 per protocol As long as PaO2 to FiO2 ratio is less than 1:150 position in prone position for 16 hours a day Check CVP daily if CVL in place Target CVP less than 4, diurese as necessary Ventilator associated pneumonia prevention protocol 11/4 plan: maintain PRVC so we can control tidal volumes 6-8cc/kg IBW, move back to prone position  Need for sedation for mechanical ventilation More discomfort with respirations 11/4, suspect tachyphylaxis to fentanyl Adjust RASS target to -1 to -2 Change fentanyl to dilaudid Continue oxycodone, clonazepam Continue precedex  Pseudomonas pneumonia Plan ceftaz for 7 days  Ongoing fever: improving, likely related to COVID ARDS and pseudomonas Continue to monitor Remove CVL  place  Best practice:  Diet: continue tube feeding Pain/Anxiety/Delirium protocol (if indicated): yes as above VAP protocol (if indicated): yes DVT prophylaxis: lovenox 0.5mg  sub q bid GI prophylaxis: Pantoprazole for stress ulcer prophylaxis Glucose control: SSI Mobility: bed rest Code Status: Full Family Communication: Will update Melissa today Disposition: remain in ICU  Labs   CBC: Recent Labs  Lab 03/28/19 0450 03/29/19 0050  03/30/19 0430  03/31/19 0500 04/01/19 3500  04/01/19 0950 04/02/19 0400 04/02/19 1516 04/03/19 0326 04/03/19 0645  WBC 11.3* 14.7*  --  12.4*  --  14.5* 11.2*  --   --  7.1  --   --  6.6  NEUTROABS 7.3 11.0*  --  10.2*  --   --   --   --   --   --   --   --   --   HGB 10.3* 12.0*   < > 12.3*   < > 12.8* 13.3   < > 13.9 14.2 13.9 13.9 13.5  HCT 33.2* 38.8*   < > 39.3   < > 41.2 44.1   < > 41.0 49.0 41.0 41.0 45.2  MCV 90.0 89.0  --   88.5  --  90.0 90.4  --   --  95.1  --   --  94.0  PLT 452* 465*  --  389  --  375 418*  --   --  380  --   --  371   < > = values in this interval not displayed.    Basic Metabolic Panel: Recent Labs  Lab 03/28/19 0450 03/29/19 0050  03/30/19 0430  03/31/19 0500 04/01/19 0521 04/01/19 0605 04/01/19 0950 04/02/19 0400 04/02/19 1516 04/03/19 0326  NA 137 136   < > 137   < > 145 146* 148* 148* 150* 150* 152*  K 4.0 4.0   < > 3.3*   < > 4.0 3.8 3.3* 3.4* 4.5 4.8 3.8  CL 92* 88*  --  94*  --  103 101  --   --  107  --   --   CO2 35* 33*  --  28  --  31 29  --   --  34*  --   --   GLUCOSE 221* 319*  --  383*  --  193* 155*  --   --  149*  --   --   BUN 55* 67*  --  93*  --  91* 103*  --   --  104*  --   --   CREATININE 1.70* 1.94*  --  1.84*  --  1.38* 1.55*  --   --  1.61*  --   --   CALCIUM 8.9 9.1  --  7.7*  --  8.2* 8.2*  --   --  8.5*  --   --   MG 1.7 1.7  --  2.1  --  2.6* 2.7*  --   --   --   --   --   PHOS 4.1 4.0  --  3.3  --  2.0* 3.7  --   --   --   --   --    < > = values in this interval not displayed.   GFR: Estimated Creatinine Clearance: 61 mL/min (A) (by C-G formula based on SCr of 1.61 mg/dL (H)). Recent Labs  Lab 03/30/19 0430 03/31/19 0500 04/01/19 0521 04/02/19 0400 04/03/19 0645  PROCALCITON 2.82 1.33 1.08 0.50  --   WBC 12.4* 14.5* 11.2* 7.1 6.6    Liver Function Tests: Recent Labs  Lab 03/28/19 0450 03/29/19 0050 03/30/19 0430  AST 34 34 31  ALT 24 24 24   ALKPHOS 73 73 60  BILITOT 0.4 0.6 1.1  PROT 7.2 8.0 7.1  ALBUMIN 2.5* 2.8* 2.4*   No results for input(s): LIPASE, AMYLASE in the last 168 hours. No results for input(s): AMMONIA in the last 168 hours.  ABG    Component Value Date/Time   PHART 7.422 04/03/2019 0326   PCO2ART 57.0 (H) 04/03/2019 0326   PO2ART 83.0 04/03/2019 0326   HCO3 37.2 (H) 04/03/2019 0326   TCO2 39 (H) 04/03/2019 0326   ACIDBASEDEF 3.0 (H) 03/17/2019 1442   O2SAT 96.0 04/03/2019 0326     Coagulation  Profile: No results for input(s): INR, PROTIME in the last 168 hours.  Cardiac Enzymes: No results for input(s): CKTOTAL, CKMB, CKMBINDEX, TROPONINI in the last 168 hours.  HbA1C: Hgb A1c MFr Bld  Date/Time Value Ref Range Status  03/26/2019 01:00 PM 7.5 (H) 4.8 - 5.6 % Final    Comment:    (NOTE) Pre diabetes:          5.7%-6.4% Diabetes:              >6.4% Glycemic control for   <7.0% adults with diabetes     CBG: Recent Labs  Lab 04/03/19 0201 04/03/19 0401 04/03/19 0556 04/03/19 0703 04/03/19 0748  GLUCAP 162* 169* 164* 147* 171*     Critical care time: 35 minutes    Heber CarolinaBrent McQuaid, MD [place] [facility] Pager: [phone] Cell: [phone] If no response, call [phone]

## 2019-04-04 ENCOUNTER — Inpatient Hospital Stay (HOSPITAL_COMMUNITY): Payer: PRIVATE HEALTH INSURANCE

## 2019-04-04 ENCOUNTER — Other Ambulatory Visit: Payer: Self-pay

## 2019-04-04 DIAGNOSIS — R0600 Dyspnea, unspecified: Secondary | ICD-10-CM

## 2019-04-04 DIAGNOSIS — R009 Unspecified abnormalities of heart beat: Secondary | ICD-10-CM

## 2019-04-04 LAB — CBC
HCT: 42.5 % (ref 39.0–52.0)
Hemoglobin: 12.7 g/dL — ABNORMAL LOW (ref 13.0–17.0)
MCH: 28.2 pg (ref 26.0–34.0)
MCHC: 29.9 g/dL — ABNORMAL LOW (ref 30.0–36.0)
MCV: 94.2 fL (ref 80.0–100.0)
Platelets: 338 10*3/uL (ref 150–400)
RBC: 4.51 MIL/uL (ref 4.22–5.81)
RDW: 14.1 % (ref 11.5–15.5)
WBC: 5.3 10*3/uL (ref 4.0–10.5)
nRBC: 0.6 % — ABNORMAL HIGH (ref 0.0–0.2)

## 2019-04-04 LAB — POCT I-STAT 7, (LYTES, BLD GAS, ICA,H+H)
Acid-Base Excess: 9 mmol/L — ABNORMAL HIGH (ref 0.0–2.0)
Bicarbonate: 36.9 mmol/L — ABNORMAL HIGH (ref 20.0–28.0)
Calcium, Ion: 1.2 mmol/L (ref 1.15–1.40)
HCT: 36 % — ABNORMAL LOW (ref 39.0–52.0)
Hemoglobin: 12.2 g/dL — ABNORMAL LOW (ref 13.0–17.0)
O2 Saturation: 93 %
Patient temperature: 36.9
Potassium: 3.7 mmol/L (ref 3.5–5.1)
Sodium: 149 mmol/L — ABNORMAL HIGH (ref 135–145)
TCO2: 39 mmol/L — ABNORMAL HIGH (ref 22–32)
pCO2 arterial: 64.9 mmHg — ABNORMAL HIGH (ref 32.0–48.0)
pH, Arterial: 7.362 (ref 7.350–7.450)
pO2, Arterial: 71 mmHg — ABNORMAL LOW (ref 83.0–108.0)

## 2019-04-04 LAB — BASIC METABOLIC PANEL
Anion gap: 9 (ref 5–15)
BUN: 92 mg/dL — ABNORMAL HIGH (ref 6–20)
CO2: 34 mmol/L — ABNORMAL HIGH (ref 22–32)
Calcium: 8.2 mg/dL — ABNORMAL LOW (ref 8.9–10.3)
Chloride: 107 mmol/L (ref 98–111)
Creatinine, Ser: 1.5 mg/dL — ABNORMAL HIGH (ref 0.61–1.24)
GFR calc Af Amer: 58 mL/min — ABNORMAL LOW (ref >60)
GFR calc non Af Amer: 50 mL/min — ABNORMAL LOW (ref >60)
Glucose, Bld: 396 mg/dL — ABNORMAL HIGH (ref 70–99)
Potassium: 3.8 mmol/L (ref 3.5–5.1)
Sodium: 150 mmol/L — ABNORMAL HIGH (ref 135–145)

## 2019-04-04 LAB — GLUCOSE, CAPILLARY
Glucose-Capillary: 363 mg/dL — ABNORMAL HIGH (ref 70–99)
Glucose-Capillary: 397 mg/dL — ABNORMAL HIGH (ref 70–99)
Glucose-Capillary: 431 mg/dL — ABNORMAL HIGH (ref 70–99)
Glucose-Capillary: 339 mg/dL — ABNORMAL HIGH (ref 70–99)
Glucose-Capillary: 291 mg/dL — ABNORMAL HIGH (ref 70–99)
Glucose-Capillary: 298 mg/dL — ABNORMAL HIGH (ref 70–99)

## 2019-04-04 LAB — HEPATIC FUNCTION PANEL
ALT: 113 U/L — ABNORMAL HIGH (ref 0–44)
AST: 40 U/L (ref 15–41)
Albumin: 2 g/dL — ABNORMAL LOW (ref 3.5–5.0)
Alkaline Phosphatase: 44 U/L (ref 38–126)
Bilirubin, Direct: 0.1 mg/dL (ref 0.0–0.2)
Indirect Bilirubin: 0.5 mg/dL (ref 0.3–0.9)
Total Bilirubin: 0.6 mg/dL (ref 0.3–1.2)
Total Protein: 5.7 g/dL — ABNORMAL LOW (ref 6.5–8.1)

## 2019-04-04 LAB — ECHOCARDIOGRAM COMPLETE
Height: 71 in
Weight: 3710.78 oz

## 2019-04-04 LAB — C-REACTIVE PROTEIN: CRP: 14.2 mg/dL — ABNORMAL HIGH (ref <1.0)

## 2019-04-04 LAB — D-DIMER, QUANTITATIVE: D-Dimer, Quant: 0.5 ug/mL-FEU (ref 0.00–0.50)

## 2019-04-04 MED ORDER — INSULIN GLARGINE 100 UNIT/ML ~~LOC~~ SOLN
40.0000 [IU] | Freq: Two times a day (BID) | SUBCUTANEOUS | Status: DC
  Administered 2019-04-04: 40 [IU] via SUBCUTANEOUS
  Filled 2019-04-04 (×2): qty 0.4

## 2019-04-04 MED ORDER — LACTATED RINGERS IV BOLUS
500.0000 mL | Freq: Once | INTRAVENOUS | Status: AC
  Administered 2019-04-04: 500 mL via INTRAVENOUS

## 2019-04-04 MED ORDER — AMIODARONE LOAD VIA INFUSION
150.0000 mg | Freq: Once | INTRAVENOUS | Status: AC
  Administered 2019-04-05: 150 mg via INTRAVENOUS

## 2019-04-04 MED ORDER — INSULIN ASPART 100 UNIT/ML ~~LOC~~ SOLN
11.0000 [IU] | SUBCUTANEOUS | Status: DC
  Administered 2019-04-04 – 2019-04-05 (×3): 11 [IU] via SUBCUTANEOUS

## 2019-04-04 MED ORDER — QUETIAPINE FUMARATE 50 MG PO TABS
50.0000 mg | ORAL_TABLET | Freq: Two times a day (BID) | ORAL | Status: DC
  Administered 2019-04-04 – 2019-04-06 (×4): 50 mg via ORAL
  Filled 2019-04-04 (×4): qty 1

## 2019-04-04 MED ORDER — FREE WATER
400.0000 mL | Status: DC
  Administered 2019-04-04 – 2019-04-07 (×18): 400 mL

## 2019-04-04 MED ORDER — INSULIN ASPART 100 UNIT/ML ~~LOC~~ SOLN
8.0000 [IU] | SUBCUTANEOUS | Status: DC
  Administered 2019-04-04 (×2): 8 [IU] via SUBCUTANEOUS

## 2019-04-04 MED ORDER — INSULIN ASPART 100 UNIT/ML ~~LOC~~ SOLN: 8.0000 [IU] | Freq: Four times a day (QID) | SUBCUTANEOUS | Status: DC

## 2019-04-04 MED ORDER — QUETIAPINE FUMARATE 25 MG PO TABS
25.0000 mg | ORAL_TABLET | Freq: Once | ORAL | Status: AC
  Administered 2019-04-04: 12:00:00 25 mg via ORAL
  Filled 2019-04-04: qty 1

## 2019-04-04 NOTE — Progress Notes (Signed)
NAME:  Angel Stuart, MRN:  [id number], DOB:  03-21-1961, LOS: 10 ADMISSION DATE:  03/27/2019, CONSULTATION DATE:  10/26 REFERRING MD:  Sharon Seller, CHIEF COMPLAINT:  Dyspnea   Brief History   58 y/o male admitted on 10/26 with ARDS from COVID 19 pneumonia required intubation in the [facility] ED.    Past Medical History  HTN HLD DM2  Significant Hospital Events   10/26 admission to ICU, prone position 10/30 culture positive pseudomonas 11/2 worsening oxygenation  Consults:  PCCM  Procedures:  10/26 ETT >  10/26 L subclavian CVL > 11/4  Significant Diagnostic Tests:  11/1 Lower extremity doppler ultrasound> negative for DVT 11/5 TTE >   Micro Data:  10/29 blood >  10/30 sputum > pseudomonas, gram negative rods, GPC; staph aureus (MSSA)  Antimicrobials/COVID RX:  10/27 Remdesivir >  10/27 decadron >   10/30 vanc > 11/2 10/30 zosyn > 11/2  11/2 ceftaz >   Interim history/subjective:   Febrile again yesterday Oxygenation improving CVL removed  Objective   Blood pressure 95/61, pulse (!) 105, temperature 98.1 F (36.7 C), resp. rate (!) 22, height 5\' 11"  (1.803 m), weight 105.2 kg, SpO2 98 %. CVP:  [8 mmHg] 8 mmHg  Vent Mode: PRVC FiO2 (%):  [40 %] 40 % Set Rate:  [15 bmp] 15 bmp Vt Set:  [600 mL] 600 mL PEEP:  [10 cmH20] 10 cmH20 Plateau Pressure:  [9 cmH20-24 cmH20] 23 cmH20   Intake/Output Summary (Last 24 hours) at 04/04/2019 0750 Last data filed at 04/04/2019 0600 Gross per 24 hour  Intake 5334.98 ml  Output 2450 ml  Net 2884.98 ml   Filed Weights   04/01/19 0500 04/02/19 0419 04/03/19 0645  Weight: 105.3 kg 105.4 kg 105.2 kg    Examination:  General:  In bed on vent HENT: NCAT ETT in place PULM: CTA B, vent supported breathing CV: RRR, no mgr GI: BS+, soft, nontender MSK: normal bulk and tone Neuro: sedated on vent   11/5 CXR images personally reviewed>ETT in place, lungs clear, minimal interstitial infiltrate  Resolved  Hospital Problem list     Assessment & Plan:  ARDS due to COVID 19 pneumonia: oxygenation improved but appears short of breath/air hungry on vent Continue mechanical ventilation per ARDS protocol Target TVol 6-8cc/kgIBW Target Plateau Pressure < 30cm H20 Target driving pressure less than 15 cm of water Target PaO2 55-65: titrate PEEP/FiO2 per protocol As long as PaO2 to FiO2 ratio is less than 1:150 position in prone position for 16 hours a day Check CVP daily if CVL in place Target CVP less than 4, diurese as necessary Ventilator associated pneumonia prevention protocol 11/5 change to pressure control ventilation, add seroquel, continue current vent settings weaning per protocol   Need for sedation for mechanical ventilation More discomfort with respirations 11/5> given lack of tachycardia, and his otherwise improvement with everything else I wonder if this is anxiety (his wife agrees) RASS target -1 to -2 Increase seroquel to 50mg  bid Continue oxycodone and clonazepam Dilaudid and precedex  Pseudomonas pneumonia MSSA pneumonia Ceftaz 7 days  Shock: low dose levophed needed, sedation related? Titrate levophed for MAP > 65 F/u Echo  Best practice:  Diet: continue tube feeding Pain/Anxiety/Delirium protocol (if indicated): yes as above VAP protocol (if indicated): yes DVT prophylaxis: lovenox 0.5mg  sub q bid GI prophylaxis: Pantoprazole for stress ulcer prophylaxis Glucose control: SSI Mobility: bed rest Code Status: Full Family Communication: I updated Melissa at length by phone today Disposition: remain in  ICU  Labs   CBC: Recent Labs  Lab 03/29/19 0050  03/30/19 0430  03/31/19 0500 04/01/19 0521  04/02/19 0400 04/02/19 1516 04/03/19 0326 04/03/19 0645 04/04/19 0519 04/04/19 0546  WBC 14.7*  --  12.4*  --  14.5* 11.2*  --  7.1  --   --  6.6  --  5.3  NEUTROABS 11.0*  --  10.2*  --   --   --   --   --   --   --   --   --   --   HGB 12.0*   < > 12.3*   < >  12.8* 13.3   < > 14.2 13.9 13.9 13.5 12.2* 12.7*  HCT 38.8*   < > 39.3   < > 41.2 44.1   < > 49.0 41.0 41.0 45.2 36.0* 42.5  MCV 89.0  --  88.5  --  90.0 90.4  --  95.1  --   --  94.0  --  94.2  PLT 465*  --  389  --  375 418*  --  380  --   --  371  --  338   < > = values in this interval not displayed.    Basic Metabolic Panel: Recent Labs  Lab 03/29/19 0050  03/30/19 0430  03/31/19 0500 04/01/19 0521  04/02/19 0400 04/02/19 1516 04/03/19 0326 04/03/19 0645 04/04/19 0519 04/04/19 0546  NA 136   < > 137   < > 145 146*   < > 150* 150* 152* 151* 149* 150*  K 4.0   < > 3.3*   < > 4.0 3.8   < > 4.5 4.8 3.8 3.6 3.7 3.8  CL 88*  --  94*  --  103 101  --  107  --   --  108  --  107  CO2 33*  --  28  --  31 29  --  34*  --   --  34*  --  34*  GLUCOSE 319*  --  383*  --  193* 155*  --  149*  --   --  161*  --  396*  BUN 67*  --  93*  --  91* 103*  --  104*  --   --  96*  --  92*  CREATININE 1.94*  --  1.84*  --  1.38* 1.55*  --  1.61*  --   --  1.34*  --  1.50*  CALCIUM 9.1  --  7.7*  --  8.2* 8.2*  --  8.5*  --   --  8.3*  --  8.2*  MG 1.7  --  2.1  --  2.6* 2.7*  --   --   --   --   --   --   --   PHOS 4.0  --  3.3  --  2.0* 3.7  --   --   --   --   --   --   --    < > = values in this interval not displayed.   GFR: Estimated Creatinine Clearance: 65.5 mL/min (A) (by C-G formula based on SCr of 1.5 mg/dL (H)). Recent Labs  Lab 03/30/19 0430 03/31/19 0500 04/01/19 0521 04/02/19 0400 04/03/19 0645 04/04/19 0546  PROCALCITON 2.82 1.33 1.08 0.50  --   --   WBC 12.4* 14.5* 11.2* 7.1 6.6 5.3    Liver Function Tests: Recent Labs  Lab 03/29/19 0050 03/30/19 0430  04/03/19 0645 04/04/19 0546  AST 34 31 100* 40  ALT 24 24 182* 113*  ALKPHOS 73 60 49 44  BILITOT 0.6 1.1 0.9 0.6  PROT 8.0 7.1 6.0* 5.7*  ALBUMIN 2.8* 2.4* 2.1* 2.0*   No results for input(s): LIPASE, AMYLASE in the last 168 hours. No results for input(s): AMMONIA in the last 168 hours.  ABG    Component  Value Date/Time   PHART 7.362 04/04/2019 0519   PCO2ART 64.9 (H) 04/04/2019 0519   PO2ART 71.0 (L) 04/04/2019 0519   HCO3 36.9 (H) 04/04/2019 0519   TCO2 39 (H) 04/04/2019 0519   ACIDBASEDEF 3.0 (H) 03/28/2019 1442   O2SAT 93.0 04/04/2019 0519     Coagulation Profile: No results for input(s): INR, PROTIME in the last 168 hours.  Cardiac Enzymes: No results for input(s): CKTOTAL, CKMB, CKMBINDEX, TROPONINI in the last 168 hours.  HbA1C: Hgb A1c MFr Bld  Date/Time Value Ref Range Status  03/01/2019 01:00 PM 7.5 (H) 4.8 - 5.6 % Final    Comment:    (NOTE) Pre diabetes:          5.7%-6.4% Diabetes:              >6.4% Glycemic control for   <7.0% adults with diabetes     CBG: Recent Labs  Lab 04/03/19 1728 04/03/19 1838 04/03/19 1941 04/03/19 2322 04/04/19 0328  GLUCAP 215* 161* 148* 384* 363*     Critical care time: 35 minutes    Heber CarolinaBrent , MD [place] [facility] Pager: [phone] Cell: [phone] If no response, call [phone]

## 2019-04-04 NOTE — Progress Notes (Signed)
Pt. BS [phone] Notified Dr. Thereasa Solo

## 2019-04-04 NOTE — Plan of Care (Signed)
  Problem: Respiratory: Goal: Complications related to the disease process, condition or treatment will be avoided or minimized Outcome: Progressing   

## 2019-04-04 NOTE — Progress Notes (Signed)
Called patient's wife [name] to give updates, questions answered.

## 2019-04-04 NOTE — Progress Notes (Signed)
  Echocardiogram 2D Echocardiogram has been performed.  Angel Stuart 04/04/2019, 8:55 AM

## 2019-04-04 NOTE — Progress Notes (Signed)
Angel Stuart  AOZ:[id number] DOB: Oct 08, 1960 DOA: 03/01/2019 PCP: Lenox Ponds, MD    Brief Narrative:  58 year old with a history of hyperlipidemia and DM 2 who began to develop upper respiratory symptoms on 10/5, and ultimately tested positive for Covid on 10/9.  Records suggest he experienced a gradual progression of symptoms with acute worsening of shortness of breath on 10/25, which prompted him to present to the [facility] ED.  In the [place] ED he was noted to have a saturation of 63% on room air.  Chest x-ray noted severe bilateral infiltrates.  He rapidly decompensated in the emergency room.  He failed a trial of BiPAP and had to be intubated.  He was subsequently transferred to [facility] to the ICU.    Significant Events: 10/05 symptom onset  10/09 positive COVID test 10/25 presented to [facility] ED - intubated  10/26 transfer to [facility] ICU 11/1 bilateral lower extremity venous duplex negative for DVT  11/5 TTE  COVID-19 specific Treatment: Decadron 10/26 > 11/4 Remdesivir 10/26 > 10/30 Convalescent plasma 10/27  Subjective: Sedated on the vent in prone position at time of exam.  Assessment & Plan:  Covid pneumonia -ARDS -acute hypoxic respiratory failure Ventilator management per PCCM - decadron and remdesivir courses have been completed - transfused convalescent plasma 10/27 AM  Recent Labs  Lab 03/29/19 0050 03/29/19 1407 03/30/19 0430 03/31/19 0500 04/01/19 0521 04/02/19 0400 04/03/19 0645 04/04/19 0546  DDIMER 2.61*  --  2.26* 2.34* 1.61* 1.46* 0.71* 0.50  FERRITIN 372*  --  651*  --   --   --   --   --   CRP 3.8*  --  18.7* 13.0* 5.4* 2.3* 0.9 14.2*  ALT 24  --  24  --   --   --  182* 113*  PROCALCITON  --  4.01 2.82 1.33 1.08 0.50  --   --     Pseudomonas on tracheal aspirate To complete 7 days of antibiotic therapy today  Sinus tachycardia Felt to be related to agitation versus intravascular depletion - sedation to be adjusted  again today  Hypernatremia Increase enteral free water  Relapsing fevers Likely due to Pseudomonas  -no true fever since 11/2  Hypotension - Septic shock Due to above - pressors per PCCM   Acute renal failure Creatinine fluctuating -follow trend  Recent Labs  Lab 03/31/19 0500 04/01/19 0521 04/02/19 0400 04/03/19 0645 04/04/19 0546  CREATININE 1.38* 1.55* 1.61* 1.34* 1.50*    DM 2 A1c 7.5 -CBG very poorly controlled with patient off insulin drip -adjust insulin therapy again today and follow  Mild transaminitis Following trend  Obesity - Estimated body mass index is 32.35 kg/m as calculated from the following:   Height as of this encounter:  (1.803 m).   Weight as of this encounter: 105.2 kg.   DVT prophylaxis: lovenox  Code Status: FULL CODE Family Communication: Per PCCM Disposition Plan: ICU  Consultants:  none  Antimicrobials:  Vancomycin 10/30 > 11/2 Zosyn 10/30 > 11/2 Elita Quick 11/2 > 11/5  Objective: Blood pressure 100/63, pulse 100, temperature 98.1 F (36.7 C), resp. rate (!) 21, height  (1.803 m), weight 105.2 kg, SpO2 99 %.  Intake/Output Summary (Last 24 hours) at 04/04/2019 0846 Last data filed at 04/04/2019 0600 Gross per 24 hour  Intake 5064.57 ml  Output 2450 ml  Net 2614.57 ml   Filed Weights   04/01/19 0500 04/02/19 0419 04/03/19 0645  Weight: 105.3 kg 105.4 kg 105.2  kg    Examination: General: In prone position - sedated  Lungs: Fine diffuse crackles without wheeze Cardiovascular: Tachycardic - regular Abdomen: Obese, soft Extremities: No signif edema B LE   CBC: Recent Labs  Lab 03/29/19 0050  03/30/19 0430  04/02/19 0400  04/03/19 0645 04/04/19 0519 04/04/19 0546  WBC 14.7*  --  12.4*   < > 7.1  --  6.6  --  5.3  NEUTROABS 11.0*  --  10.2*  --   --   --   --   --   --   HGB 12.0*   < > 12.3*   < > 14.2   < > 13.5 12.2* 12.7*  HCT 38.8*   < > 39.3   < > 49.0   < > 45.2 36.0* 42.5  MCV 89.0  --  88.5   < >  95.1  --  94.0  --  94.2  PLT 465*  --  389   < > 380  --  371  --  338   < > = values in this interval not displayed.   Basic Metabolic Panel: Recent Labs  Lab 03/30/19 0430  03/31/19 0500 04/01/19 0521  04/02/19 0400  04/03/19 0645 04/04/19 0519 04/04/19 0546  NA 137   < > 145 146*   < > 150*   < > 151* 149* 150*  K 3.3*   < > 4.0 3.8   < > 4.5   < > 3.6 3.7 3.8  CL 94*  --  103 101  --  107  --  108  --  107  CO2 28  --  31 29  --  34*  --  34*  --  34*  GLUCOSE 383*  --  193* 155*  --  149*  --  161*  --  396*  BUN 93*  --  91* 103*  --  104*  --  96*  --  92*  CREATININE 1.84*  --  1.38* 1.55*  --  1.61*  --  1.34*  --  1.50*  CALCIUM 7.7*  --  8.2* 8.2*  --  8.5*  --  8.3*  --  8.2*  MG 2.1  --  2.6* 2.7*  --   --   --   --   --   --   PHOS 3.3  --  2.0* 3.7  --   --   --   --   --   --    < > = values in this interval not displayed.   GFR: Estimated Creatinine Clearance: 65.5 mL/min (A) (by C-G formula based on SCr of 1.5 mg/dL (H)).  Liver Function Tests: Recent Labs  Lab 03/29/19 0050 03/30/19 0430 04/03/19 0645 04/04/19 0546  AST 34 31 100* 40  ALT 24 24 182* 113*  ALKPHOS 73 60 49 44  BILITOT 0.6 1.1 0.9 0.6  PROT 8.0 7.1 6.0* 5.7*  ALBUMIN 2.8* 2.4* 2.1* 2.0*    HbA1C: Hgb A1c MFr Bld  Date/Time Value Ref Range Status  2019-03-26 01:00 PM 7.5 (H) 4.8 - 5.6 % Final    Comment:    (NOTE) Pre diabetes:          5.7%-6.4% Diabetes:              >6.4% Glycemic control for   <7.0% adults with diabetes     CBG: Recent Labs  Lab 04/03/19 1728 04/03/19 1838 04/03/19 1941 04/03/19 2322 04/04/19 0328  GLUCAP 215* 161* 148* 384* 363*    Recent Results (from the past 240 hour(s))  MRSA PCR Screening     Status: None   Collection Time: November 02, 2018  8:57 PM   Specimen: Nasal Mucosa; Nasopharyngeal  Result Value Ref Range Status   MRSA by PCR NEGATIVE NEGATIVE Final    Comment:        The GeneXpert MRSA Assay (FDA approved for NASAL specimens  only), is one component of a comprehensive MRSA colonization surveillance program. It is not intended to diagnose MRSA infection nor to guide or monitor treatment for MRSA infections. Performed at [facility], [address] [place], [place] [address]   Culture, blood (routine x 2)     Status: None   Collection Time: 03/28/19 11:02 PM   Specimen: BLOOD  Result Value Ref Range Status   Specimen Description   Final    BLOOD RIGHT ANTECUBITAL Performed at [facility], [address] [place], [place] [address]    Special Requests   Final    BOTTLES DRAWN AEROBIC AND ANAEROBIC Blood Culture adequate volume Performed at [facility], [address] [place], [place] [address]    Culture   Final    NO GROWTH 5 DAYS Performed at [facility], [address] [place], [place] [address]    Report Status 04/03/2019 FINAL  Final  Culture, blood (routine x 2)     Status: None   Collection Time: 03/28/19 11:40 PM   Specimen: BLOOD LEFT HAND  Result Value Ref Range Status   Specimen Description   Final    BLOOD LEFT HAND Performed at [facility], [address] [place], [place] [address]    Special Requests   Final    BOTTLES DRAWN AEROBIC AND ANAEROBIC Blood Culture adequate volume Performed at [facility], [address] [place], [place] [address]    Culture   Final    NO GROWTH 5 DAYS Performed at [facility], [address] [place], [place] [address]    Report Status 04/03/2019 FINAL  Final  Expectorated sputum assessment w rflx to resp cult     Status: None   Collection Time: 03/29/19 12:50 AM   Specimen: Expectorated Sputum  Result Value Ref Range Status   Specimen Description EXPECTORATED SPUTUM  Final   Special Requests Immunocompromised  Final   Sputum evaluation   Final    THIS SPECIMEN IS ACCEPTABLE FOR SPUTUM CULTURE Performed at [facility], [address] [place], [place] [address]    Report Status 03/29/2019 FINAL  Final  Culture, respiratory     Status: None   Collection Time: 03/29/19 12:50 AM  Result Value Ref Range Status   Specimen Description   Final    EXPECTORATED SPUTUM Performed at [facility], [address] [place], [place] [address]    Special Requests   Final    Immunocompromised Reflexed from [id number] Performed at [facility], [address] [place], [place] [address]    Gram Stain   Final    MODERATE WBC PRESENT, PREDOMINANTLY PMN ABUNDANT GRAM NEGATIVE RODS MODERATE GRAM POSITIVE COCCI Performed at [facility], [address] [place], [place] [address]    Culture   Final    MODERATE PSEUDOMONAS AERUGINOSA MODERATE STAPHYLOCOCCUS AUREUS    Report Status 04/03/2019 FINAL  Final   Organism ID, Bacteria PSEUDOMONAS AERUGINOSA  Final   Organism ID, Bacteria STAPHYLOCOCCUS AUREUS  Final      Susceptibility   Pseudomonas aeruginosa - MIC*    CEFTAZIDIME 4 SENSITIVE Sensitive     CIPROFLOXACIN <=0.25 SENSITIVE Sensitive     GENTAMICIN 4 SENSITIVE Sensitive     IMIPENEM 2 SENSITIVE Sensitive     PIP/TAZO 16 SENSITIVE Sensitive     CEFEPIME 4 SENSITIVE Sensitive     * MODERATE PSEUDOMONAS AERUGINOSA   Staphylococcus aureus - MIC*    CIPROFLOXACIN <=0.5 SENSITIVE Sensitive     ERYTHROMYCIN <=0.25 SENSITIVE Sensitive     GENTAMICIN <=0.5 SENSITIVE Sensitive     OXACILLIN <=0.25 SENSITIVE Sensitive     TETRACYCLINE <=1 SENSITIVE Sensitive     VANCOMYCIN <=0.5 SENSITIVE Sensitive     TRIMETH/SULFA <=10 SENSITIVE Sensitive     CLINDAMYCIN <=0.25 SENSITIVE Sensitive     RIFAMPIN <=0.5 SENSITIVE Sensitive     Inducible Clindamycin NEGATIVE Sensitive     * MODERATE STAPHYLOCOCCUS AUREUS     Scheduled Meds: . chlorhexidine  15 mL Mouth/Throat BID  . Chlorhexidine Gluconate Cloth  6 each Topical Daily  . clonazePAM  1 mg Per Tube BID  .  enoxaparin (LOVENOX) injection  55 mg Subcutaneous Q12H  . feeding supplement (PRO-STAT SUGAR FREE 64)  60 mL Per Tube TID  . free water  300 mL Per Tube Q4H  . insulin aspart  0-20 Units Subcutaneous Q4H  . insulin glargine  60 Units Subcutaneous Daily  . mouth rinse  15 mL Mouth Rinse 10 times per day  . midodrine  5 mg Per Tube TID PC  . oxyCODONE  10 mg Per Tube Q6H  . pantoprazole sodium  40 mg Per Tube Daily  . QUEtiapine  25 mg Oral BID   Continuous Infusions: . cefTAZidime (FORTAZ)  IV 2 g (04/04/19 0600)  . dexmedetomidine (PRECEDEX) IV infusion 0.4 mcg/kg/hr (04/04/19 0600)  . feeding supplement (VITAL 1.5 CAL) 1,000 mL (04/03/19 1100)  . HYDROmorphone 1.5 mg/hr (04/03/19 1742)  . norepinephrine (LEVOPHED) Adult infusion 3 mcg/min (04/04/19 0600)     LOS: 10 days   Cherene Altes, MD [facility] Office  [phone] Pager - Text Page per Amion  If 7PM-7AM, please contact night-coverage per Amion 04/04/2019, 8:46 AM

## 2019-04-04 NOTE — Progress Notes (Signed)
Called patient's wife, Angel Stuart to give updates on patient. There was no answer. Unable to give updates on patient at this time.

## 2019-04-04 NOTE — Progress Notes (Signed)
Inpatient Diabetes Program Recommendations  AACE/ADA: New Consensus Statement on Inpatient Glycemic Control (2015)  Target Ranges:  Prepandial:   less than 140 mg/dL      Peak postprandial:   less than 180 mg/dL (1-2 hours)      Critically ill patients:  140 - 180 mg/dL   Lab Results  Component Value Date   GLUCAP 397 (H) 04/04/2019   HGBA1C 7.5 (H) 04-17-19    Review of Glycemic Control Results for Angel Stuart, Angel Stuart (MRN [id number]) as of 04/04/2019 10:31  Ref. Range 04/03/2019 19:41 04/03/2019 23:22 04/04/2019 03:28 04/04/2019 09:09  Glucose-Capillary Latest Ref Range: 70 - 99 mg/dL 148 (H) 384 (H) 363 (H) 397 (H)   Current orders for Inpatient glycemic control:  Lantus 60 Daily Novolog 0-20 untis Q4 hours Novolog 8 units Q4 Tube Feed Coverage  Decadron 6 mg Q12 hours Vital AF 1.2 70 ml/hour Creatinine 1.34 BUN  96  Inpatient Diabetes Program Recommendations:    -  Increase basal Levemir 40 units bid -  Increase Novolog 11 units Q4 Tube Feed Coverage  Thanks,  Tama Headings RN, MSN, BC-ADM Inpatient Diabetes Coordinator Team Pager [phone] (8a-5p)

## 2019-04-05 ENCOUNTER — Inpatient Hospital Stay (HOSPITAL_COMMUNITY): Payer: PRIVATE HEALTH INSURANCE

## 2019-04-05 DIAGNOSIS — I4891 Unspecified atrial fibrillation: Secondary | ICD-10-CM

## 2019-04-05 LAB — POCT I-STAT 7, (LYTES, BLD GAS, ICA,H+H)
Acid-Base Excess: 11 mmol/L — ABNORMAL HIGH (ref 0.0–2.0)
Bicarbonate: 40.2 mmol/L — ABNORMAL HIGH (ref 20.0–28.0)
Calcium, Ion: 1.22 mmol/L (ref 1.15–1.40)
HCT: 31 % — ABNORMAL LOW (ref 39.0–52.0)
Hemoglobin: 10.5 g/dL — ABNORMAL LOW (ref 13.0–17.0)
O2 Saturation: 88 %
Patient temperature: 36.8
Potassium: 4.3 mmol/L (ref 3.5–5.1)
Sodium: 153 mmol/L — ABNORMAL HIGH (ref 135–145)
TCO2: 43 mmol/L — ABNORMAL HIGH (ref 22–32)
pCO2 arterial: 84 mmHg (ref 32.0–48.0)
pH, Arterial: 7.287 — ABNORMAL LOW (ref 7.350–7.450)
pO2, Arterial: 65 mmHg — ABNORMAL LOW (ref 83.0–108.0)

## 2019-04-05 LAB — COMPREHENSIVE METABOLIC PANEL
ALT: 70 U/L — ABNORMAL HIGH (ref 0–44)
AST: 22 U/L (ref 15–41)
Albumin: 2.3 g/dL — ABNORMAL LOW (ref 3.5–5.0)
Alkaline Phosphatase: 46 U/L (ref 38–126)
Anion gap: 7 (ref 5–15)
BUN: 71 mg/dL — ABNORMAL HIGH (ref 6–20)
CO2: 39 mmol/L — ABNORMAL HIGH (ref 22–32)
Calcium: 8.2 mg/dL — ABNORMAL LOW (ref 8.9–10.3)
Chloride: 109 mmol/L (ref 98–111)
Creatinine, Ser: 1.11 mg/dL (ref 0.61–1.24)
GFR calc Af Amer: >60 mL/min (ref >60)
GFR calc non Af Amer: >60 mL/min (ref >60)
Glucose, Bld: 362 mg/dL — ABNORMAL HIGH (ref 70–99)
Potassium: 4.4 mmol/L (ref 3.5–5.1)
Sodium: 155 mmol/L — ABNORMAL HIGH (ref 135–145)
Total Bilirubin: 0.6 mg/dL (ref 0.3–1.2)
Total Protein: 5.8 g/dL — ABNORMAL LOW (ref 6.5–8.1)

## 2019-04-05 LAB — GLUCOSE, CAPILLARY
Glucose-Capillary: 306 mg/dL — ABNORMAL HIGH (ref 70–99)
Glucose-Capillary: 261 mg/dL — ABNORMAL HIGH (ref 70–99)
Glucose-Capillary: 290 mg/dL — ABNORMAL HIGH (ref 70–99)
Glucose-Capillary: 258 mg/dL — ABNORMAL HIGH (ref 70–99)
Glucose-Capillary: 257 mg/dL — ABNORMAL HIGH (ref 70–99)
Glucose-Capillary: 219 mg/dL — ABNORMAL HIGH (ref 70–99)

## 2019-04-05 LAB — CBC
HCT: 40.2 % (ref 39.0–52.0)
Hemoglobin: 11.2 g/dL — ABNORMAL LOW (ref 13.0–17.0)
MCH: 27.6 pg (ref 26.0–34.0)
MCHC: 27.9 g/dL — ABNORMAL LOW (ref 30.0–36.0)
MCV: 99 fL (ref 80.0–100.0)
Platelets: 275 10*3/uL (ref 150–400)
RBC: 4.06 MIL/uL — ABNORMAL LOW (ref 4.22–5.81)
RDW: 14.5 % (ref 11.5–15.5)
WBC: 4.7 10*3/uL (ref 4.0–10.5)
nRBC: 1.1 % — ABNORMAL HIGH (ref 0.0–0.2)

## 2019-04-05 LAB — PHOSPHORUS: Phosphorus: 3.7 mg/dL (ref 2.5–4.6)

## 2019-04-05 LAB — TSH: TSH: 0.383 u[IU]/mL (ref 0.350–4.500)

## 2019-04-05 LAB — MAGNESIUM: Magnesium: 2.9 mg/dL — ABNORMAL HIGH (ref 1.7–2.4)

## 2019-04-05 MED ORDER — ALBUMIN HUMAN 25 % IV SOLN
25.0000 g | Freq: Once | INTRAVENOUS | Status: AC
  Administered 2019-04-05: 14:00:00 25 g via INTRAVENOUS
  Filled 2019-04-05: qty 50

## 2019-04-05 MED ORDER — SODIUM CHLORIDE 0.9 % IV SOLN
250.0000 mL | INTRAVENOUS | Status: DC
  Administered 2019-04-05 – 2019-04-21 (×8): 250 mL via INTRAVENOUS

## 2019-04-05 MED ORDER — PHENYLEPHRINE HCL-NACL 10-0.9 MG/250ML-% IV SOLN
25.0000 ug/min | INTRAVENOUS | Status: DC
  Administered 2019-04-05 (×2): 25 ug/min via INTRAVENOUS
  Administered 2019-04-06: 75 ug/min via INTRAVENOUS
  Administered 2019-04-06 (×2): 50 ug/min via INTRAVENOUS
  Administered 2019-04-06: 75 ug/min via INTRAVENOUS
  Administered 2019-04-06: 20 ug/min via INTRAVENOUS
  Administered 2019-04-06: 50 ug/min via INTRAVENOUS
  Administered 2019-04-07: 46 ug/min via INTRAVENOUS
  Administered 2019-04-07: 30 ug/min via INTRAVENOUS
  Filled 2019-04-05 (×10): qty 250

## 2019-04-05 MED ORDER — MIDODRINE HCL 5 MG PO TABS
5.0000 mg | ORAL_TABLET | Freq: Three times a day (TID) | ORAL | Status: DC
  Administered 2019-04-05 – 2019-04-09 (×12): 5 mg
  Filled 2019-04-05 (×17): qty 1

## 2019-04-05 MED ORDER — INSULIN ASPART 100 UNIT/ML ~~LOC~~ SOLN
12.0000 [IU] | SUBCUTANEOUS | Status: DC
  Administered 2019-04-05 – 2019-04-06 (×7): 12 [IU] via SUBCUTANEOUS

## 2019-04-05 MED ORDER — LACTATED RINGERS IV BOLUS
1000.0000 mL | Freq: Once | INTRAVENOUS | Status: AC
  Administered 2019-04-05: 11:00:00 1000 mL via INTRAVENOUS

## 2019-04-05 MED ORDER — AMIODARONE HCL IN DEXTROSE 360-4.14 MG/200ML-% IV SOLN
30.0000 mg/h | INTRAVENOUS | Status: DC
  Administered 2019-04-05: 30 mg/h via INTRAVENOUS
  Administered 2019-04-05: 60 mg/h via INTRAVENOUS
  Administered 2019-04-06: 30 mg/h via INTRAVENOUS
  Filled 2019-04-05 (×3): qty 200

## 2019-04-05 MED ORDER — LACTATED RINGERS IV BOLUS
500.0000 mL | Freq: Once | INTRAVENOUS | Status: AC
  Administered 2019-04-05: 08:00:00 500 mL via INTRAVENOUS

## 2019-04-05 MED ORDER — INSULIN GLARGINE 100 UNIT/ML ~~LOC~~ SOLN
50.0000 [IU] | Freq: Two times a day (BID) | SUBCUTANEOUS | Status: DC
  Administered 2019-04-05 – 2019-04-15 (×22): 50 [IU] via SUBCUTANEOUS
  Administered 2019-04-16: 10:00:00 25 [IU] via SUBCUTANEOUS
  Administered 2019-04-16 – 2019-04-30 (×24): 50 [IU] via SUBCUTANEOUS
  Filled 2019-04-05 (×53): qty 0.5

## 2019-04-05 MED ORDER — METOPROLOL TARTRATE 5 MG/5ML IV SOLN
2.5000 mg | INTRAVENOUS | Status: DC | PRN
  Administered 2019-04-05: 23:00:00 2.5 mg via INTRAVENOUS
  Filled 2019-04-05: qty 5

## 2019-04-05 MED ORDER — AMIODARONE HCL IN DEXTROSE 360-4.14 MG/200ML-% IV SOLN
60.0000 mg/h | INTRAVENOUS | Status: AC
  Administered 2019-04-05 (×2): 60 mg/h via INTRAVENOUS
  Filled 2019-04-05 (×2): qty 200

## 2019-04-05 MED ORDER — AMIODARONE LOAD VIA INFUSION
150.0000 mg | Freq: Once | INTRAVENOUS | Status: AC
  Administered 2019-04-05: 150 mg via INTRAVENOUS
  Filled 2019-04-05: qty 83.34

## 2019-04-05 MED ORDER — LACTATED RINGERS IV SOLN
INTRAVENOUS | Status: AC
  Administered 2019-04-05: 13:00:00 via INTRAVENOUS

## 2019-04-05 MED ORDER — ALBUMIN HUMAN 25 % IV SOLN
25.0000 g | Freq: Once | INTRAVENOUS | Status: AC
  Administered 2019-04-05: 25 g via INTRAVENOUS

## 2019-04-05 NOTE — Progress Notes (Signed)
[place] Progress Note Patient Name: Cederic Mozley DOB: 1960-12-09 MRN: [id number]   Date of Service  04/05/2019  HPI/Events of Note  Notified of tachycardia 140s to 160s with no significant decrease in BP although already on norepinephrine. Hypernatremic getting 400cc free water every 4 hours with significant urine output.   eICU Interventions   Ordered 500 cc LR bolus and albumin 25% when HR persistently elevated  Ordered amiodarone bolus then drip  Repeat electrolytes     Intervention Category Major Interventions: Arrhythmia - evaluation and management;Hypotension - evaluation and management  Judd Lien 04/05/2019, 12:07 AM

## 2019-04-05 NOTE — Progress Notes (Deleted)
RT NOTE:  RT assisted with head turn to left. ETT secured throughout @ 25cm lip.

## 2019-04-05 NOTE — Progress Notes (Signed)
NAME:  Angel Stuart, MRN:  [id number], DOB:  05/28/1961, LOS: 11 ADMISSION DATE:  05-Apr-2019, CONSULTATION DATE:  10/26 REFERRING MD:  Sharon SellerMcClung, CHIEF COMPLAINT:  Dyspnea   Brief History   58 y/o male admitted on 10/26 with ARDS from COVID 19 pneumonia [facility].    Past Medical History  HTN HLD DM2  Significant Hospital Events   10/26 admission to ICU, prone position 10/30 culture positive pseudomonas 11/2 worsening oxygenation 11/5 minimal ventilator settings, some air hunger on exam 11/6 atrial fib with RVR  Consults:  PCCM  Procedures:  10/26 ETT >  10/26 L subclavian CVL > 11/4  Significant Diagnostic Tests:  11/1 Lower extremity doppler ultrasound> negative for DVT 11/5 TTE > LVEF 60-65%, mild LVH, normal RV  Micro Data:  10/29 blood >  10/30 sputum > pseudomonas, gram negative rods, GPC; staph aureus (MSSA)  Antimicrobials/COVID RX:  10/27 Remdesivir >  10/27 decadron >   10/30 vanc > 11/2 10/30 zosyn > 11/2  11/2 ceftaz > 11/5  Interim history/subjective:   Afib with RVR Appears more comfortable with breathing, but still with increased respiratory effort  Objective   Blood pressure 90/60, pulse (!) 146, temperature 99.1 F (37.3 C), resp. rate 19, height 5\' 11"  (1.803 m), weight 105.2 kg, SpO2 99 %.    Vent Mode: PCV FiO2 (%):  [40 %] 40 % Set Rate:  [15 bmp] 15 bmp PEEP:  [5 cmH20] 5 cmH20 Plateau Pressure:  [9 cmH20-17 cmH20] 17 cmH20   Intake/Output Summary (Last 24 hours) at 04/05/2019 0753 Last data filed at 04/05/2019 0700 Gross per 24 hour  Intake 3937.49 ml  Output 4550 ml  Net -612.51 ml   Filed Weights   04/01/19 0500 04/02/19 0419 04/03/19 0645  Weight: 105.3 kg 105.4 kg 105.2 kg    Examination:  General:  In bed on vent HENT: NCAT ETT in place PULM: CTA B, vent supported breathing, increased effort but improved compared to yesterday, minimal accessory muscle use CV: RRR, no mgr GI:  BS+, soft, nontender MSK: normal bulk and tone Neuro: sedated on vent   11/5 CXR images personally reviewed>ETT in place, lungs clear, minimal interstitial infiltrate  Resolved Hospital Problem list     Assessment & Plan:  ARDS due to COVID 19 pneumonia: oxygenation improved but appears short of breath/air hungry on vent 11/6 resp acidosis Continue pressure control ventilation Target TVol 6-8cc/kgIBW Target Plateau Pressure < 30cm H20 Target driving pressure less than 15 cm of water Target PaO2 55-65: titrate PEEP/FiO2 per protocol As long as PaO2 to FiO2 ratio is less than 1:150 position in prone position for 16 hours a day Check CVP daily if CVL in place Target CVP less than 4, diurese as necessary Ventilator associated pneumonia prevention protocol 11/6 plan: increase RR to 20   Need for sedation for mechanical ventilation More discomfort with respirations 11/5> given lack of tachycardia, and his otherwise improvement with everything else I wonder if this is anxiety (his wife agrees) RASS target -1 to -2 seroquel 50 bid Continue oxycodone/clonazepam Wean dilaudid overnight Would attempt WUA/SBT on 11/7 as work of breathing improved  Pseudomonas pneumonia MSSA pneumonia Completed 7 days of antibiotics Monitor for fever or other signs of deterioration  Shock: low dose levophed needed, sedation related? Continue low dose levophed for MAP > 65 Wean off sedation : start with dilaudid  Best practice:  Diet: continue tube feeding Pain/Anxiety/Delirium protocol (if indicated): yes as above VAP  protocol (if indicated): yes DVT prophylaxis: lovenox 0.5mg  sub q bid GI prophylaxis: Pantoprazole for stress ulcer prophylaxis Glucose control: SSI Mobility: bed rest Code Status: Full Family Communication: I updated his wife by phone on 11/6 Disposition: remain in ICU  Labs   CBC: Recent Labs  Lab 03/30/19 0430  04/01/19 0521  04/02/19 0400  04/03/19 0645 04/04/19 0519  04/04/19 0546 04/05/19 0238 04/05/19 0419  WBC 12.4*   < > 11.2*  --  7.1  --  6.6  --  5.3 4.7  --   NEUTROABS 10.2*  --   --   --   --   --   --   --   --   --   --   HGB 12.3*   < > 13.3   < > 14.2   < > 13.5 12.2* 12.7* 11.2* 10.5*  HCT 39.3   < > 44.1   < > 49.0   < > 45.2 36.0* 42.5 40.2 31.0*  MCV 88.5   < > 90.4  --  95.1  --  94.0  --  94.2 99.0  --   PLT 389   < > 418*  --  380  --  371  --  338 275  --    < > = values in this interval not displayed.    Basic Metabolic Panel: Recent Labs  Lab 03/30/19 0430  03/31/19 0500 04/01/19 0521  04/02/19 0400  04/03/19 0645 04/04/19 0519 04/04/19 0546 04/05/19 0238 04/05/19 0419  NA 137   < > 145 146*   < > 150*   < > 151* 149* 150* 155* 153*  K 3.3*   < > 4.0 3.8   < > 4.5   < > 3.6 3.7 3.8 4.4 4.3  CL 94*  --  103 101  --  107  --  108  --  107 109  --   CO2 28  --  31 29  --  34*  --  34*  --  34* 39*  --   GLUCOSE 383*  --  193* 155*  --  149*  --  161*  --  396* 362*  --   BUN 93*  --  91* 103*  --  104*  --  96*  --  92* 71*  --   CREATININE 1.84*  --  1.38* 1.55*  --  1.61*  --  1.34*  --  1.50* 1.11  --   CALCIUM 7.7*  --  8.2* 8.2*  --  8.5*  --  8.3*  --  8.2* 8.2*  --   MG 2.1  --  2.6* 2.7*  --   --   --   --   --   --  2.9*  --   PHOS 3.3  --  2.0* 3.7  --   --   --   --   --   --  3.7  --    < > = values in this interval not displayed.   GFR: Estimated Creatinine Clearance: 88.5 mL/min (by C-G formula based on SCr of 1.11 mg/dL). Recent Labs  Lab 03/30/19 0430 03/31/19 0500 04/01/19 0521 04/02/19 0400 04/03/19 0645 04/04/19 0546 04/05/19 0238  PROCALCITON 2.82 1.33 1.08 0.50  --   --   --   WBC 12.4* 14.5* 11.2* 7.1 6.6 5.3 4.7    Liver Function Tests: Recent Labs  Lab 03/30/19 0430 04/03/19 0645 04/04/19 0546 04/05/19 0238  AST 31 100* 40 22  ALT 24 182* 113* 70*  ALKPHOS 60 49 44 46  BILITOT 1.1 0.9 0.6 0.6  PROT 7.1 6.0* 5.7* 5.8*  ALBUMIN 2.4* 2.1* 2.0* 2.3*   No results for  input(s): LIPASE, AMYLASE in the last 168 hours. No results for input(s): AMMONIA in the last 168 hours.  ABG    Component Value Date/Time   PHART 7.287 (L) 04/05/2019 0419   PCO2ART 84.0 (HH) 04/05/2019 0419   PO2ART 65.0 (L) 04/05/2019 0419   HCO3 40.2 (H) 04/05/2019 0419   TCO2 43 (H) 04/05/2019 0419   ACIDBASEDEF 3.0 (H) 03/06/2019 1442   O2SAT 88.0 04/05/2019 0419     Coagulation Profile: No results for input(s): INR, PROTIME in the last 168 hours.  Cardiac Enzymes: No results for input(s): CKTOTAL, CKMB, CKMBINDEX, TROPONINI in the last 168 hours.  HbA1C: Hgb A1c MFr Bld  Date/Time Value Ref Range Status  03/20/2019 01:00 PM 7.5 (H) 4.8 - 5.6 % Final    Comment:    (NOTE) Pre diabetes:          5.7%-6.4% Diabetes:              >6.4% Glycemic control for   <7.0% adults with diabetes     CBG: Recent Labs  Lab 04/04/19 1215 04/04/19 1639 04/04/19 1946 04/04/19 2317 04/05/19 0337  GLUCAP 431* 339* 291* 298* 306*     Critical care time: 35 minutes    Heber Reynolds, MD [place] [facility] Pager: [phone] Cell: [phone] If no response, call [phone]

## 2019-04-05 NOTE — Progress Notes (Signed)
[place] Progress Note Patient Name: Angel Stuart DOB: 1961-03-12 MRN: [id number]   Date of Service  04/05/2019  HPI/Events of Note  Patient back in afib RVR, continues to be on amiodarone drip. Now off phenylephrine.  eICU Interventions  Ordered metoprolol IV prn for HR > 120     Intervention Category Major Interventions: Arrhythmia - evaluation and management  Judd Lien 04/05/2019, 11:44 PM

## 2019-04-05 NOTE — Progress Notes (Signed)
Angel Stuart  BSJ:[id number] DOB: 1960-09-05 DOA: 03/21/2019 PCP: Lenox Ponds, MD    Brief Narrative:  58 year old with a history of hyperlipidemia and DM 2 who began to develop upper respiratory symptoms on 10/5, and ultimately tested positive for Covid on 10/9.  Records suggest he experienced a gradual progression of symptoms with acute worsening of shortness of breath on 10/25, which prompted him to present to the [facility] ED.  In the [place] ED he was noted to have a saturation of 63% on room air.  Chest x-ray noted severe bilateral infiltrates.  He rapidly decompensated in the emergency room.  He failed a trial of BiPAP and had to be intubated.  He was subsequently transferred to [facility] to the ICU.    Significant Events: 10/05 symptom onset  10/09 positive COVID test 10/25 presented to [place] ED - intubated  10/26 transfer to [facility] ICU 11/1 bilateral lower extremity venous duplex negative for DVT  11/5 TTE  COVID-19 specific Treatment: Decadron 10/26 > 11/4 Remdesivir 10/26 > 10/30 Convalescent plasma 10/27  Subjective: Sedated on ventilator.  Developed rapid atrial fibrillation acutely last night along with hypotension.  Is presently on amiodarone drip.  Appears to have responded favorably although transiently to volume resuscitation.  Assessment & Plan:  Covid pneumonia -ARDS -acute hypoxic respiratory failure Ventilator management per PCCM - decadron and remdesivir courses have been completed - transfused convalescent plasma 10/27 AM  Pseudomonas on tracheal aspirate -relapsing fevers Has completed 7 days of antibiotic therapy and remains afebrile  Acute atrial fibrillation with RVR Volume resuscitate further today -check TSH -continue amiodarone short-term  Hypernatremia Increase enteral free water as well as free water via IV  Recent Labs  Lab 04/03/19 0645 04/04/19 0519 04/04/19 0546 04/05/19 0238 04/05/19 0419  NA 151* 149* 150*  155* 153*    Hypotension - Septic shock Due to above - pressors per PCCM -volume resuscitate today  Acute renal failure Creatinine has improved coincident with volume resuscitation -continue same and follow  Recent Labs  Lab 04/01/19 0521 04/02/19 0400 04/03/19 0645 04/04/19 0546 04/05/19 0238  CREATININE 1.55* 1.61* 1.34* 1.50* 1.11    DM 2 A1c 7.5 -CBG remains markedly elevated -increasing treatment again today -follow trend  Mild transaminitis Improving  Obesity - Estimated body mass index is 32.35 kg/m as calculated from the following:   Height as of this encounter: 5\' 11"  (1.803 m).   Weight as of this encounter: 105.2 kg.   DVT prophylaxis: lovenox  Code Status: FULL CODE Family Communication: Per PCCM Disposition Plan: ICU  Consultants:  none  Antimicrobials:  Vancomycin 10/30 > 11/2 Zosyn 10/30 > 11/2 13/2 11/2 > 11/5  Objective: Blood pressure 90/60, pulse (!) 146, temperature 99.1 F (37.3 C), resp. rate 19, height 5\' 11"  (1.803 m), weight 105.2 kg, SpO2 99 %.  Intake/Output Summary (Last 24 hours) at 04/05/2019 0758 Last data filed at 04/05/2019 0700 Gross per 24 hour  Intake 3937.49 ml  Output 4550 ml  Net -612.51 ml   Filed Weights   04/01/19 0500 04/02/19 0419 04/03/19 0645  Weight: 105.3 kg 105.4 kg 105.2 kg    Examination: General: Prone -on ventilator -sedated -respirations more synchronous today Lungs: Diffuse fine crackles with no wheezing Cardiovascular: Irregularly irregular and tachycardic with heart rate approximately 120 Abdomen: Obese, soft Extremities: No edema B lower extremities  CBC: Recent Labs  Lab 03/30/19 0430  04/03/19 0645  04/04/19 0546 04/05/19 0238 04/05/19 0419  WBC 12.4*   < >  6.6  --  5.3 4.7  --   NEUTROABS 10.2*  --   --   --   --   --   --   HGB 12.3*   < > 13.5   < > 12.7* 11.2* 10.5*  HCT 39.3   < > 45.2   < > 42.5 40.2 31.0*  MCV 88.5   < > 94.0  --  94.2 99.0  --   PLT 389   < > 371  --   338 275  --    < > = values in this interval not displayed.   Basic Metabolic Panel: Recent Labs  Lab 03/31/19 0500 04/01/19 0521  04/03/19 0645  04/04/19 0546 04/05/19 0238 04/05/19 0419  NA 145 146*   < > 151*   < > 150* 155* 153*  K 4.0 3.8   < > 3.6   < > 3.8 4.4 4.3  CL 103 101   < > 108  --  107 109  --   CO2 31 29   < > 34*  --  34* 39*  --   GLUCOSE 193* 155*   < > 161*  --  396* 362*  --   BUN 91* 103*   < > 96*  --  92* 71*  --   CREATININE 1.38* 1.55*   < > 1.34*  --  1.50* 1.11  --   CALCIUM 8.2* 8.2*   < > 8.3*  --  8.2* 8.2*  --   MG 2.6* 2.7*  --   --   --   --  2.9*  --   PHOS 2.0* 3.7  --   --   --   --  3.7  --    < > = values in this interval not displayed.   GFR: Estimated Creatinine Clearance: 88.5 mL/min (by C-G formula based on SCr of 1.11 mg/dL).  Liver Function Tests: Recent Labs  Lab 03/30/19 0430 04/03/19 0645 04/04/19 0546 04/05/19 0238  AST 31 100* 40 22  ALT 24 182* 113* 70*  ALKPHOS 60 49 44 46  BILITOT 1.1 0.9 0.6 0.6  PROT 7.1 6.0* 5.7* 5.8*  ALBUMIN 2.4* 2.1* 2.0* 2.3*    HbA1C: Hgb A1c MFr Bld  Date/Time Value Ref Range Status  2019/04/24 01:00 PM 7.5 (H) 4.8 - 5.6 % Final    Comment:    (NOTE) Pre diabetes:          5.7%-6.4% Diabetes:              >6.4% Glycemic control for   <7.0% adults with diabetes     CBG: Recent Labs  Lab 04/04/19 1215 04/04/19 1639 04/04/19 1946 04/04/19 2317 04/05/19 0337  GLUCAP 431* 339* 291* 298* 306*    Recent Results (from the past 240 hour(s))  Culture, blood (routine x 2)     Status: None   Collection Time: 03/28/19 11:02 PM   Specimen: BLOOD  Result Value Ref Range Status   Specimen Description   Final    BLOOD RIGHT ANTECUBITAL Performed at [facility], [address] [place], [place] [address]    Special Requests   Final    BOTTLES DRAWN AEROBIC AND ANAEROBIC Blood Culture adequate volume Performed at [facility], [address] [place], [place] [address]    Culture   Final    NO GROWTH 5 DAYS Performed at [facility], [address] [place],  [place] [address]    Report Status 04/03/2019 FINAL  Final  Culture, blood (routine x 2)     Status: None   Collection Time: 03/28/19 11:40 PM   Specimen: BLOOD LEFT HAND  Result Value Ref Range Status   Specimen Description   Final    BLOOD LEFT HAND Performed at [place] [address], [place], [place] [address]    Special Requests   Final    BOTTLES DRAWN AEROBIC AND ANAEROBIC Blood Culture adequate volume Performed at [facility] [address], [place], [place] [address]    Culture   Final    NO GROWTH 5 DAYS Performed at [facility], [place] [address] [place], [place] [address]    Report Status 04/03/2019 FINAL  Final  Expectorated sputum assessment w rflx to resp cult     Status: None   Collection Time: 03/29/19 12:50 AM   Specimen: Expectorated Sputum  Result Value Ref Range Status   Specimen Description EXPECTORATED SPUTUM  Final   Special Requests Immunocompromised  Final   Sputum evaluation   Final    THIS SPECIMEN IS ACCEPTABLE FOR SPUTUM CULTURE Performed at [facility], [place] [address] [place], [place] [address]    Report Status 03/29/2019 FINAL  Final  Culture, respiratory     Status: None   Collection Time: 03/29/19 12:50 AM  Result Value Ref Range Status   Specimen Description   Final    EXPECTORATED SPUTUM Performed at [place] [address] [place], [place] [address]    Special Requests   Final    Immunocompromised Reflexed from [id number] Performed at [facility], [place] [address] [place], [place] [address]    Gram Stain   Final    MODERATE WBC PRESENT, PREDOMINANTLY PMN ABUNDANT GRAM NEGATIVE RODS MODERATE GRAM POSITIVE COCCI Performed at [facility], [place] [address], [place], [place] [address]    Culture    Final    MODERATE PSEUDOMONAS AERUGINOSA MODERATE STAPHYLOCOCCUS AUREUS    Report Status 04/03/2019 FINAL  Final   Organism ID, Bacteria PSEUDOMONAS AERUGINOSA  Final   Organism ID, Bacteria STAPHYLOCOCCUS AUREUS  Final      Susceptibility   Pseudomonas aeruginosa - MIC*    CEFTAZIDIME 4 SENSITIVE Sensitive     CIPROFLOXACIN <=0.25 SENSITIVE Sensitive     GENTAMICIN 4 SENSITIVE Sensitive     IMIPENEM 2 SENSITIVE Sensitive     PIP/TAZO 16 SENSITIVE Sensitive     CEFEPIME 4 SENSITIVE Sensitive     * MODERATE PSEUDOMONAS AERUGINOSA   Staphylococcus aureus - MIC*    CIPROFLOXACIN <=0.5 SENSITIVE Sensitive     ERYTHROMYCIN <=0.25 SENSITIVE Sensitive     GENTAMICIN <=0.5 SENSITIVE Sensitive     OXACILLIN <=0.25 SENSITIVE Sensitive     TETRACYCLINE <=1 SENSITIVE Sensitive     VANCOMYCIN <=0.5 SENSITIVE Sensitive     TRIMETH/SULFA <=10 SENSITIVE Sensitive     CLINDAMYCIN <=0.25 SENSITIVE Sensitive     RIFAMPIN <=0.5 SENSITIVE Sensitive     Inducible Clindamycin NEGATIVE Sensitive     * MODERATE STAPHYLOCOCCUS AUREUS     Scheduled Meds: . chlorhexidine  15 mL Mouth/Throat BID  . Chlorhexidine Gluconate Cloth  6 each Topical Daily  . clonazePAM  1 mg Per Tube BID  . enoxaparin (LOVENOX) injection  55 mg Subcutaneous Q12H  . feeding supplement (PRO-STAT SUGAR FREE 64)  60 mL Per Tube TID  . free water  400 mL Per Tube Q4H  . insulin aspart  0-20 Units Subcutaneous Q4H  . insulin aspart  11 Units Subcutaneous Q4H  . insulin glargine  40 Units Subcutaneous BID  . mouth rinse  15 mL Mouth Rinse 10 times per day  . midodrine  5 mg Per Tube TID PC  . oxyCODONE  10 mg Per Tube Q6H  . pantoprazole sodium  40 mg Per Tube Daily  . QUEtiapine  50 mg Oral BID   Continuous Infusions: . sodium chloride    . amiodarone    . dexmedetomidine (PRECEDEX) IV infusion 0.4 mcg/kg/hr (04/05/19 0753)  . feeding supplement (VITAL 1.5 CAL) 1,000 mL (04/04/19 1814)  . HYDROmorphone 1.5 mg/hr (04/05/19  0700)  . norepinephrine (LEVOPHED) Adult infusion Stopped (04/05/19 16100607)  . phenylephrine (NEO-SYNEPHRINE) Adult infusion 25 mcg/min (04/05/19 0700)     LOS: 11 days   Lonia BloodJeffrey T. Vandora Jaskulski, MD [facility] Office  [phone] Pager - Text Page per Amion  If 7PM-7AM, please contact night-coverage per Amion 04/05/2019, 7:58 AM

## 2019-04-05 NOTE — Progress Notes (Signed)
Wife called and updated over phone at this time.

## 2019-04-05 NOTE — Progress Notes (Signed)
eLink Physician-Brief Progress Note Patient Name: Angel Stuart DOB: 05-Feb-1961 MRN: [id number]   Date of Service  04/05/2019  HPI/Events of Note  Transient improvement in HR with fluid and amiodarone bolus   eICU Interventions   Ordered neosynephrine to transition norepinephrine  Continue amiodarone drip  No significant electrolyte abnormality     Intervention Category Major Interventions: Arrhythmia - evaluation and management  Judd Lien 04/05/2019, 5:11 AM

## 2019-04-06 ENCOUNTER — Other Ambulatory Visit: Payer: Self-pay

## 2019-04-06 DIAGNOSIS — J9602 Acute respiratory failure with hypercapnia: Secondary | ICD-10-CM

## 2019-04-06 LAB — POCT I-STAT 7, (LYTES, BLD GAS, ICA,H+H)
Acid-Base Excess: 15 mmol/L — ABNORMAL HIGH (ref 0.0–2.0)
Bicarbonate: 39.7 mmol/L — ABNORMAL HIGH (ref 20.0–28.0)
Calcium, Ion: 1.19 mmol/L (ref 1.15–1.40)
HCT: 29 % — ABNORMAL LOW (ref 39.0–52.0)
Hemoglobin: 9.9 g/dL — ABNORMAL LOW (ref 13.0–17.0)
O2 Saturation: 87 %
Patient temperature: 37.4
Potassium: 3.4 mmol/L — ABNORMAL LOW (ref 3.5–5.1)
Sodium: 151 mmol/L — ABNORMAL HIGH (ref 135–145)
TCO2: 41 mmol/L — ABNORMAL HIGH (ref 22–32)
pCO2 arterial: 49 mmHg — ABNORMAL HIGH (ref 32.0–48.0)
pH, Arterial: 7.517 — ABNORMAL HIGH (ref 7.350–7.450)
pO2, Arterial: 50 mmHg — ABNORMAL LOW (ref 83.0–108.0)

## 2019-04-06 LAB — CBC
HCT: 35.1 % — ABNORMAL LOW (ref 39.0–52.0)
Hemoglobin: 10 g/dL — ABNORMAL LOW (ref 13.0–17.0)
MCH: 27.9 pg (ref 26.0–34.0)
MCHC: 28.5 g/dL — ABNORMAL LOW (ref 30.0–36.0)
MCV: 98 fL (ref 80.0–100.0)
Platelets: 253 10*3/uL (ref 150–400)
RBC: 3.58 MIL/uL — ABNORMAL LOW (ref 4.22–5.81)
RDW: 14.3 % (ref 11.5–15.5)
WBC: 5.1 10*3/uL (ref 4.0–10.5)
nRBC: 0.8 % — ABNORMAL HIGH (ref 0.0–0.2)

## 2019-04-06 LAB — RENAL FUNCTION PANEL
Albumin: 2 g/dL — ABNORMAL LOW (ref 3.5–5.0)
Anion gap: 7 (ref 5–15)
BUN: 53 mg/dL — ABNORMAL HIGH (ref 6–20)
CO2: 39 mmol/L — ABNORMAL HIGH (ref 22–32)
Calcium: 8.2 mg/dL — ABNORMAL LOW (ref 8.9–10.3)
Chloride: 108 mmol/L (ref 98–111)
Creatinine, Ser: 0.92 mg/dL (ref 0.61–1.24)
GFR calc Af Amer: >60 mL/min (ref >60)
GFR calc non Af Amer: >60 mL/min (ref >60)
Glucose, Bld: 200 mg/dL — ABNORMAL HIGH (ref 70–99)
Phosphorus: 1.1 mg/dL — ABNORMAL LOW (ref 2.5–4.6)
Potassium: 4 mmol/L (ref 3.5–5.1)
Sodium: 154 mmol/L — ABNORMAL HIGH (ref 135–145)

## 2019-04-06 LAB — GLUCOSE, CAPILLARY
Glucose-Capillary: 166 mg/dL — ABNORMAL HIGH (ref 70–99)
Glucose-Capillary: 213 mg/dL — ABNORMAL HIGH (ref 70–99)
Glucose-Capillary: 212 mg/dL — ABNORMAL HIGH (ref 70–99)
Glucose-Capillary: 155 mg/dL — ABNORMAL HIGH (ref 70–99)
Glucose-Capillary: 134 mg/dL — ABNORMAL HIGH (ref 70–99)

## 2019-04-06 MED ORDER — FENTANYL 2500MCG IN NS 250ML (10MCG/ML) PREMIX INFUSION: 0.0000 ug/h | INTRAVENOUS | Status: DC

## 2019-04-06 MED ORDER — POTASSIUM & SODIUM PHOSPHATES 280-160-250 MG PO PACK: 1.0000 | PACK | Freq: Three times a day (TID) | ORAL | Status: DC

## 2019-04-06 MED ORDER — PSYLLIUM 95 % PO PACK
1.0000 | PACK | Freq: Two times a day (BID) | ORAL | Status: DC
  Administered 2019-04-06 – 2019-04-07 (×4): 1
  Filled 2019-04-06 (×6): qty 1

## 2019-04-06 MED ORDER — SODIUM CHLORIDE 0.9 % IV SOLN
2.0000 g | Freq: Three times a day (TID) | INTRAVENOUS | Status: DC
  Administered 2019-04-06 – 2019-04-15 (×27): 2 g via INTRAVENOUS
  Filled 2019-04-06 (×27): qty 2

## 2019-04-06 MED ORDER — INSULIN ASPART 100 UNIT/ML ~~LOC~~ SOLN
14.0000 [IU] | SUBCUTANEOUS | Status: DC
  Administered 2019-04-06 – 2019-04-27 (×112): 14 [IU] via SUBCUTANEOUS

## 2019-04-06 MED ORDER — AMIODARONE HCL 200 MG PO TABS: 200.0000 mg | ORAL_TABLET | Freq: Every day | ORAL | Status: DC

## 2019-04-06 MED ORDER — LORAZEPAM 2 MG/ML IJ SOLN
2.0000 mg | INTRAMUSCULAR | Status: DC | PRN
  Administered 2019-04-06 – 2019-04-15 (×14): 2 mg via INTRAVENOUS
  Filled 2019-04-06 (×16): qty 1

## 2019-04-06 MED ORDER — AMIODARONE HCL 200 MG PO TABS
400.0000 mg | ORAL_TABLET | Freq: Two times a day (BID) | ORAL | Status: DC
  Administered 2019-04-06: 11:00:00 400 mg
  Filled 2019-04-06 (×2): qty 2

## 2019-04-06 MED ORDER — K PHOS MONO-SOD PHOS DI & MONO 155-852-130 MG PO TABS
500.0000 mg | ORAL_TABLET | Freq: Three times a day (TID) | ORAL | Status: AC
  Administered 2019-04-06 – 2019-04-08 (×6): 500 mg
  Filled 2019-04-06 (×8): qty 2

## 2019-04-06 NOTE — Progress Notes (Signed)
Harlin RainRichard Craig Stenberg  RUE:[id number]:[id number] DOB: 02/11/1961 DOA: 03/11/2019 PCP: Lenox PondsSilva Zapata, Edwin, MD    Brief Narrative:  58 year old with a history of hyperlipidemia and DM 2 who began to develop upper respiratory symptoms on 10/5, and ultimately tested positive for Covid on 10/9.  Records suggest he experienced a gradual progression of symptoms with acute worsening of shortness of breath on 10/25, which prompted [facility].  In the [facility] he was noted to have a saturation of 63% on room air.  Chest x-ray noted severe bilateral infiltrates.  He rapidly decompensated in the emergency room.  He failed a trial of BiPAP and had to be intubated.  He was subsequently transferred to [facility] to [facility].    Significant Events: 10/05 symptom onset  10/09 positive COVID test 10/25 [facility] - int[facility]ated  10/26 [facility] ICU 11/1 bilateral lower extremity venous duplex negative for DVT  11/5 TTE  COVID-19 specific Treatment: Decadron 10/26 > 11/4 Remdesivir 10/26 > 10/30 Convalescent plasma 10/27  Subjective: Sedated on ventilator.  Assessment & Plan:  Covid pneumonia -ARDS -acute hypoxic respiratory failure Ventilator management per PCCM - decadron and remdesivir courses have been completed - transfused convalescent plasma 10/27 AM  Pseudomonas and MSSA on tracheal aspirate -relapsing fevers Has completed 7 days of antibiotic therapy and remains afebrile  Acute atrial fibrillation with RVR Heart rate now well controlled - TSH is normal - continue amiodarone short-term  Hypernatremia Cont enteral free water as well as free water via IV  Recent Labs  Lab 04/04/19 0519 04/04/19 0546 04/05/19 0238 04/05/19 0419 04/06/19 0546  NA 149* 150* 155* 153* 154*    Hypotension - Septic shock Due to above - pressors per PCCM -volume resuscitate  Acute renal failure Creatinine has normalized with volume resuscitation   Recent Labs   Lab 04/02/19 0400 04/03/19 0645 04/04/19 0546 04/05/19 0238 04/06/19 0546  CREATININE 1.61* 1.34* 1.50* 1.11 0.92    DM 2 A1c 7.5 -CBG not yet at goal - adjust again today and follow   Mild transaminitis Improving  Hypophosphatemia Supplement and follow  Obesity - Estimated body mass index is 32.35 kg/m as calculated from the following:   Height as of this encounter: 5\' 11"  (1.803 m).   Weight as of this encounter: 105.2 kg.   DVT prophylaxis: lovenox  Code Status: FULL CODE Family Communication: Per PCCM Disposition Plan: ICU  Consultants:  none  Antimicrobials:  Vancomycin 10/30 > 11/2 Zosyn 10/30 > 11/2 Elita QuickFortaz 11/2 > 11/5 Cefepime 11/7 >  Objective: Blood pressure (!) 103/56, pulse 92, temperature 98.8 F (37.1 C), resp. rate (!) 30, height 5\' 11"  (1.803 m), weight 105.2 kg, SpO2 100 %.  Intake/Output Summary (Last 24 hours) at 04/06/2019 0800 Last data filed at 04/06/2019 0600 Gross per 24 hour  Intake 4722.37 ml  Output 2130 ml  Net 2592.37 ml   Filed Weights   04/01/19 0500 04/02/19 0419 04/03/19 0645  Weight: 105.3 kg 105.4 kg 105.2 kg    Examination: General: on ventilator -sedated  Lungs: Diffuse fine crackles  Cardiovascular: Irregularly irregular - rate controlled  Abdomen: Obese, soft Extremities: No edema B LE  CBC: Recent Labs  Lab 04/04/19 0546 04/05/19 0238 04/05/19 0419 04/06/19 0546  WBC 5.3 4.7  --  5.1  HGB 12.7* 11.2* 10.5* 10.0*  HCT 42.5 40.2 31.0* 35.1*  MCV 94.2 99.0  --  98.0  PLT 338 275  --  253   Basic Metabolic Panel: Recent Labs  Lab 03/31/19 0500 04/01/19 0521  04/04/19 0546 04/05/19 0238 04/05/19 0419 04/06/19 0546  NA 145 146*   < > 150* 155* 153* 154*  K 4.0 3.8   < > 3.8 4.4 4.3 4.0  CL 103 101   < > 107 109  --  108  CO2 31 29   < > 34* 39*  --  39*  GLUCOSE 193* 155*   < > 396* 362*  --  200*  BUN 91* 103*   < > 92* 71*  --  53*  CREATININE 1.38* 1.55*   < > 1.50* 1.11  --  0.92  CALCIUM  8.2* 8.2*   < > 8.2* 8.2*  --  8.2*  MG 2.6* 2.7*  --   --  2.9*  --   --   PHOS 2.0* 3.7  --   --  3.7  --  1.1*   < > = values in this interval not displayed.   GFR: Estimated Creatinine Clearance: 106.8 mL/min (by C-G formula based on SCr of 0.92 mg/dL).  Liver Function Tests: Recent Labs  Lab 04/03/19 0645 04/04/19 0546 04/05/19 0238 04/06/19 0546  AST 100* 40 22  --   ALT 182* 113* 70*  --   ALKPHOS 49 44 46  --   BILITOT 0.9 0.6 0.6  --   PROT 6.0* 5.7* 5.8*  --   ALBUMIN 2.1* 2.0* 2.3* 2.0*    HbA1C: Hgb A1c MFr Bld  Date/Time Value Ref Range Status  April 11, 2019 01:00 PM 7.5 (H) 4.8 - 5.6 % Final    Comment:    (NOTE) Pre diabetes:          5.7%-6.4% Diabetes:              >6.4% Glycemic control for   <7.0% adults with diabetes     CBG: Recent Labs  Lab 04/05/19 1642 04/05/19 1928 04/05/19 2300 04/06/19 0342 04/06/19 0728  GLUCAP 258* 257* 219* 166* 213*    Recent Results (from the past 240 hour(s))  Culture, blood (routine x 2)     Status: None   Collection Time: 03/28/19 11:02 PM   Specimen: BLOOD  Result Value Ref Range Status   Specimen Description   Final    BLOOD RIGHT ANTECUBITAL Performed at [facility], [address] [place], [place] [address]    Special Requests   Final    BOTTLES DRAWN AEROBIC AND ANAEROBIC Blood Culture adequate volume Performed at [facility], [address], [place], [place] [address]    Culture   Final    NO GROWTH 5 DAYS Performed at [facility], [address] [place], [place] [address]    Report Status 04/03/2019 FINAL  Final  Culture, blood (routine x 2)     Status: None   Collection Time: 03/28/19 11:40 PM   Specimen: BLOOD LEFT HAND  Result Value Ref Range Status   Specimen Description   Final    BLOOD LEFT HAND Performed at [facility], [address], [place], [place] [address]    Special Requests   Final    BOTTLES DRAWN AEROBIC  AND ANAEROBIC Blood Culture adequate volume Performed at [facility], [address], [place], [place] [address]    Culture   Final    NO GROWTH 5 DAYS Performed at [facility], [address] [place], [place]  [address]    Report Status 04/03/2019 FINAL  Final  Expectorated sputum assessment w rflx to resp cult     Status: None   Collection Time: 03/29/19 12:50 AM   Specimen: Expectorated Sputum  Result Value Ref Range Status   Specimen Description EXPECTORATED SPUTUM  Final   Special Requests Immunocompromised  Final   Sputum evaluation   Final    THIS SPECIMEN IS ACCEPTABLE FOR SPUTUM CULTURE Performed at [facility], [place] [address], [place], [place] [address]    Report Status 03/29/2019 FINAL  Final  Culture, respiratory     Status: None   Collection Time: 03/29/19 12:50 AM  Result Value Ref Range Status   Specimen Description   Final    EXPECTORATED SPUTUM Performed at [place] [address], [place], [place] [address]    Special Requests   Final    Immunocompromised Reflexed from [id number] Performed at [facility], [place] [address], [place], [place] [address]    Gram Stain   Final    MODERATE WBC PRESENT, PREDOMINANTLY PMN ABUNDANT GRAM NEGATIVE RODS MODERATE GRAM POSITIVE COCCI Performed at [facility], [place] [address], [place], [place] [address]    Culture   Final    MODERATE PSEUDOMONAS AERUGINOSA MODERATE STAPHYLOCOCCUS AUREUS    Report Status 04/03/2019 FINAL  Final   Organism ID, Bacteria PSEUDOMONAS AERUGINOSA  Final   Organism ID, Bacteria STAPHYLOCOCCUS AUREUS  Final      Susceptibility   Pseudomonas aeruginosa - MIC*    CEFTAZIDIME 4 SENSITIVE Sensitive     CIPROFLOXACIN <=0.25 SENSITIVE Sensitive     GENTAMICIN 4 SENSITIVE Sensitive     IMIPENEM 2 SENSITIVE Sensitive     PIP/TAZO 16 SENSITIVE Sensitive     CEFEPIME 4 SENSITIVE Sensitive     * MODERATE  PSEUDOMONAS AERUGINOSA   Staphylococcus aureus - MIC*    CIPROFLOXACIN <=0.5 SENSITIVE Sensitive     ERYTHROMYCIN <=0.25 SENSITIVE Sensitive     GENTAMICIN <=0.5 SENSITIVE Sensitive     OXACILLIN <=0.25 SENSITIVE Sensitive     TETRACYCLINE <=1 SENSITIVE Sensitive     VANCOMYCIN <=0.5 SENSITIVE Sensitive     TRIMETH/SULFA <=10 SENSITIVE Sensitive     CLINDAMYCIN <=0.25 SENSITIVE Sensitive     RIFAMPIN <=0.5 SENSITIVE Sensitive     Inducible Clindamycin NEGATIVE Sensitive     * MODERATE STAPHYLOCOCCUS AUREUS     Scheduled Meds: . chlorhexidine  15 mL Mouth/Throat BID  . Chlorhexidine Gluconate Cloth  6 each Topical Daily  . clonazePAM  1 mg Per Tube BID  . enoxaparin (LOVENOX) injection  55 mg Subcutaneous Q12H  . feeding supplement (PRO-STAT SUGAR FREE 64)  60 mL Per Tube TID  . free water  400 mL Per Tube Q4H  . insulin aspart  0-20 Units Subcutaneous Q4H  . insulin aspart  12 Units Subcutaneous Q4H  . insulin glargine  50 Units Subcutaneous BID  . mouth rinse  15 mL Mouth Rinse 10 times per day  . midodrine  5 mg Per Tube Q8H  . oxyCODONE  10 mg Per Tube Q6H  . pantoprazole sodium  40 mg Per Tube Daily  . QUEtiapine  50 mg Oral BID   Continuous Infusions: . sodium chloride 10 mL/hr at 04/06/19 0600  . amiodarone 30 mg/hr (04/06/19 0659)  . dexmedetomidine (PRECEDEX) IV infusion 1 mcg/kg/hr (04/06/19 0600)  . feeding supplement (VITAL 1.5 CAL) 80 mL/hr at 04/05/19 1400  . HYDROmorphone 0.5  mg/hr (04/06/19 0600)  . norepinephrine (LEVOPHED) Adult infusion Stopped (04/05/19 9242)  . phenylephrine (NEO-SYNEPHRINE) Adult infusion 20 mcg/min (04/06/19 0600)     LOS: 12 days   Lonia Blood, MD [facility] Office  [phone] Pager - Text Page per Amion  If 7PM-7AM, please contact night-coverage per Amion 04/06/2019, 8:00 AM

## 2019-04-06 NOTE — Progress Notes (Addendum)
[place] Progress Note Patient Name: Angel Stuart DOB: [birth year] MRN: [id number]   Date of Service  04/06/2019  HPI/Events of Note  EKG reviewed  - NSR with rate = 87. PVC's. Sinus pause. LVH and Prolonged QTc interval (QTc interval = 574 milliseconds). Will try to continue Amiodarone PO for now. However, if QTc remains prolonged, may need to hold or D/C Amiodarone as well.   eICU Interventions  Will order: 1. D/C Precedex IV infusion.  2. D/C Zofran. 3. D/C Seroquel. 4. Titrate Dilaudid IV infusion up to ceiling for sedation.  5. Monitor QTc interval Q 4 hours. Nofify MD if QTc interval > 500 milliseconds.      Intervention Category Major Interventions: Arrhythmia - evaluation and management  [name],[name] 04/06/2019, 11:23 PM

## 2019-04-06 NOTE — Progress Notes (Signed)
Pharmacy Antibiotic Note  Angel Stuart is a 58 y.o. male admitted on 04/10/2019 with COVID-19 pneumonia.  Recently completed 7 days of Ceftazidime for pseudomonas and MSSA pneumonia.    Pharmacy has been consulted to restart Cefepime dosing.  Repeat cultures prior to stopping given copious secretions.  Plan: Cefepime 2g IV q8h Follow up renal function, culture results, duration, and clinical course.   Height: 5\' 11"  (180.3 cm) Weight: 231 lb 14.8 oz (105.2 kg) IBW/kg (Calculated) : 75.3  Temp (24hrs), Avg:98.4 F (36.9 C), Min:94.3 F (34.6 C), Max:99.7 F (37.6 C)  Recent Labs  Lab 04/02/19 0400 04/03/19 0645 04/04/19 0546 04/05/19 0238 04/06/19 0546  WBC 7.1 6.6 5.3 4.7 5.1  CREATININE 1.61* 1.34* 1.50* 1.11 0.92    Estimated Creatinine Clearance: 106.8 mL/min (by C-G formula based on SCr of 0.92 mg/dL).    Allergies  Allergen Reactions  . Clindamycin Hcl Other (See Comments)    Other reaction(s): Other (See Comments) Other reaction(s): ANAPHYLAXIS Other reaction(s): ANAPHYLAXIS Other reaction(s): Other (See Comments) Other reaction(s): ANAPHYLAXIS Other reaction(s): ANAPHYLAXIS Other reaction(s): ANAPHYLAXIS   . 5-Alpha Reductase Inhibitors   . Semaglutide Nausea And Vomiting  . Hydromorphone Nausea And Vomiting    Per patient Per patient Per patient     Antimicrobials this admission: 10/27 remdesivir >> 10/30 10/30 Vanc >> 11/1 10/30 Zosyn >> 11/2 11/2 Ceftazidime >> 11/5 11/7 Cefepime >>   Microbiology results: 10/26 MRSA PCR: negative 10/29 BCx ngtd  10/30 exp sputum: mod Pseudomonas aeruginosa  (pan-sens), MSSA  Thank you for allowing pharmacy to be a part of this patient's care.  Gretta Arab PharmD, BCPS Clinical pharmacist phone 7am- 5pm: [phone] 04/06/2019 11:02 AM

## 2019-04-06 NOTE — Progress Notes (Signed)
NAME:  Angel Stuart, MRN:  [id number], DOB:  08/14/1960, LOS: 12 ADMISSION DATE:  09-11-2018, CONSULTATION DATE:  10/26 REFERRING MD:  Sharon SellerMcClung, CHIEF COMPLAINT:  Dyspnea   Brief History   58 y/o male admitted on 10/26 with ARDS from COVID 19 pneumonia [facility].    Past Medical History  HTN HLD DM2  Significant Hospital Events   10/26 admission to ICU, prone position 10/30 culture positive pseudomonas 11/2 worsening oxygenation 11/5 minimal ventilator settings, some air hunger on exam 11/6 atrial fib with RVR  Consults:  PCCM  Procedures:  10/26 ETT >  10/26 L subclavian CVL > 11/4  Significant Diagnostic Tests:  11/1 Lower extremity doppler ultrasound> negative for DVT 11/5 TTE > LVEF 60-65%, mild LVH, normal RV  Micro Data:  10/29 blood >  10/30 sputum > pseudomonas, gram negative rods, GPC; staph aureus (MSSA)  Antimicrobials/COVID RX:  10/27 Remdesivir >  10/27 decadron >   10/30 vanc > 11/2 10/30 zosyn > 11/2  11/2 ceftaz > 11/5  Interim history/subjective:   Dyssynchrony with coughing - associated desaturation. Increased sedation  Objective   Blood pressure (!) 181/125, pulse 100, temperature 98.2 F (36.8 C), resp. rate (!) 34, height 5\' 11"  (1.803 m), weight 105.2 kg, SpO2 (!) 88 %.    Vent Mode: PCV FiO2 (%):  [40 %] 40 % Set Rate:  [15 bmp-20 bmp] 20 bmp Vt Set:  [600 mL] 600 mL PEEP:  [5 cmH20-8 cmH20] 8 cmH20 Plateau Pressure:  [17 cmH20-25 cmH20] 25 cmH20   Intake/Output Summary (Last 24 hours) at 04/06/2019 0937 Last data filed at 04/06/2019 0800 Gross per 24 hour  Intake 4891.15 ml  Output 2330 ml  Net 2561.15 ml   Filed Weights   04/01/19 0500 04/02/19 0419 04/03/19 0645  Weight: 105.3 kg 105.4 kg 105.2 kg    Examination:  General:  Intubated and sedated. Obese HENT: ETT/OGT in place with no pressure injury PULM: bronchial breathing as bases posteriorly, ventilator dyssynchrony. Copious  white secretions.  Maintaining saturation.  CV: RRR, no mgr GI: BS+, soft, nontender MSK: normal bulk and tone Neuro: sedated on vent, no response to pain  Resolved Hospital Problem list     Assessment & Plan:   Critically ill due to acute hypoxic and hypercapneic respiratory failure requiring mechanical ventilation.  On lung protective ventilation protocol. Ventilator dyssynchrony with resultant drop in saturation and and increase hypercarbia. - increase sedation to limit dyssynchrony  - Continue lung protective ventilation. Dyssynchrony did not improve with change in ventilator mode. - Continue frequent suctioning and increase PEEP to maintain recruitment.     ARDS due to COVID 19 pneumonia Secondary bacterial pneumonia due to P.aeruginosa and MSSA. Completed course of treatment for COVID-19 - Continue antibacterial coverage for both organisms.  - Repeat cultures prior to stopping given copious secretions. - Guaifenasin as mucolytic   Critically ill due to hypotension requiring titration of phenylephrine, likely sedation related Continue low dose levophed for MAP > 65  Hypernatremia Secondary to insensible losses from fever - continue free water repletion.  Best practice:  Diet: continue tube feeding Pain/Anxiety/Delirium protocol (if indicated): dilaudid and dexmedetomidine. Lorazepam prn. VAP protocol (if indicated): yes DVT prophylaxis: lovenox 0.5mg  sub q bid GI prophylaxis: Pantoprazole for stress ulcer prophylaxis Glucose control: SSI Mobility: bed rest Code Status: Full Family Communication: Dr Kendrick FriesMcQuaid updated his wife by phone on 11/6 Disposition: remain in ICU  Labs   CBC: Recent Labs  Lab  04/02/19 0400  04/03/19 0645 04/04/19 0519 04/04/19 0546 04/05/19 0238 04/05/19 0419 04/06/19 0546  WBC 7.1  --  6.6  --  5.3 4.7  --  5.1  HGB 14.2   < > 13.5 12.2* 12.7* 11.2* 10.5* 10.0*  HCT 49.0   < > 45.2 36.0* 42.5 40.2 31.0* 35.1*  MCV 95.1  --  94.0   --  94.2 99.0  --  98.0  PLT 380  --  371  --  338 275  --  253   < > = values in this interval not displayed.    Basic Metabolic Panel: Recent Labs  Lab 03/31/19 0500 04/01/19 0521  04/02/19 0400  04/03/19 0645 04/04/19 0519 04/04/19 0546 04/05/19 0238 04/05/19 0419 04/06/19 0546  NA 145 146*   < > 150*   < > 151* 149* 150* 155* 153* 154*  K 4.0 3.8   < > 4.5   < > 3.6 3.7 3.8 4.4 4.3 4.0  CL 103 101  --  107  --  108  --  107 109  --  108  CO2 31 29  --  34*  --  34*  --  34* 39*  --  39*  GLUCOSE 193* 155*  --  149*  --  161*  --  396* 362*  --  200*  BUN 91* 103*  --  104*  --  96*  --  92* 71*  --  53*  CREATININE 1.38* 1.55*  --  1.61*  --  1.34*  --  1.50* 1.11  --  0.92  CALCIUM 8.2* 8.2*  --  8.5*  --  8.3*  --  8.2* 8.2*  --  8.2*  MG 2.6* 2.7*  --   --   --   --   --   --  2.9*  --   --   PHOS 2.0* 3.7  --   --   --   --   --   --  3.7  --  1.1*   < > = values in this interval not displayed.   GFR: Estimated Creatinine Clearance: 106.8 mL/min (by C-G formula based on SCr of 0.92 mg/dL). Recent Labs  Lab 03/31/19 0500 04/01/19 0521 04/02/19 0400 04/03/19 0645 04/04/19 0546 04/05/19 0238 04/06/19 0546  PROCALCITON 1.33 1.08 0.50  --   --   --   --   WBC 14.5* 11.2* 7.1 6.6 5.3 4.7 5.1    Liver Function Tests: Recent Labs  Lab 04/03/19 0645 04/04/19 0546 04/05/19 0238 04/06/19 0546  AST 100* 40 22  --   ALT 182* 113* 70*  --   ALKPHOS 49 44 46  --   BILITOT 0.9 0.6 0.6  --   PROT 6.0* 5.7* 5.8*  --   ALBUMIN 2.1* 2.0* 2.3* 2.0*   No results for input(s): LIPASE, AMYLASE in the last 168 hours. No results for input(s): AMMONIA in the last 168 hours.  ABG    Component Value Date/Time   PHART 7.287 (L) 04/05/2019 0419   PCO2ART 84.0 (HH) 04/05/2019 0419   PO2ART 65.0 (L) 04/05/2019 0419   HCO3 40.2 (H) 04/05/2019 0419   TCO2 43 (H) 04/05/2019 0419   ACIDBASEDEF 3.0 (H) 03/29/2019 1442   O2SAT 88.0 04/05/2019 0419     Coagulation Profile:  No results for input(s): INR, PROTIME in the last 168 hours.  Cardiac Enzymes: No results for input(s): CKTOTAL, CKMB, CKMBINDEX, TROPONINI in the last 168 hours.  HbA1C: Hgb A1c MFr Bld  Date/Time Value Ref Range Status  02-Apr-2019 01:00 PM 7.5 (H) 4.8 - 5.6 % Final    Comment:    (NOTE) Pre diabetes:          5.7%-6.4% Diabetes:              >6.4% Glycemic control for   <7.0% adults with diabetes     CBG: Recent Labs  Lab 04/05/19 1642 04/05/19 1928 04/05/19 2300 04/06/19 0342 04/06/19 0728  GLUCAP 258* 257* 219* 166* 213*     CRITICAL CARE Performed by: Lynnell Catalan   Total critical care time: 50 minutes  Critical care time was exclusive of separately billable procedures and treating other patients.  Critical care was necessary to treat or prevent imminent or life-threatening deterioration.  Critical care was time spent personally by me on the following activities: development of treatment plan with patient and/or surrogate as well as nursing, discussions with consultants, evaluation of patient's response to treatment, examination of patient, obtaining history from patient or surrogate, ordering and performing treatments and interventions, ordering and review of laboratory studies, ordering and review of radiographic studies, pulse oximetry, re-evaluation of patient's condition and participation in multidisciplinary rounds.  Lynnell Catalan, MD [facility] ICU Physician [facility]  Pager: [phone] Mobile: [phone] After hours: [phone].

## 2019-04-06 NOTE — Plan of Care (Signed)
Py intubated and sedated

## 2019-04-06 NOTE — Progress Notes (Signed)
Paged MD Thereasa Solo that pt was bladder scanned, in and out completed with 1300 urine out, 0 ml noted on bladder scan after in and out.  Pt tolerated the I&O without complications.

## 2019-04-07 LAB — APTT: aPTT: 37 seconds — ABNORMAL HIGH (ref 24–36)

## 2019-04-07 LAB — POCT I-STAT 7, (LYTES, BLD GAS, ICA,H+H)
Acid-Base Excess: 9 mmol/L — ABNORMAL HIGH (ref 0.0–2.0)
Acid-Base Excess: 10 mmol/L — ABNORMAL HIGH (ref 0.0–2.0)
Acid-Base Excess: 10 mmol/L — ABNORMAL HIGH (ref 0.0–2.0)
Acid-Base Excess: 13 mmol/L — ABNORMAL HIGH (ref 0.0–2.0)
Bicarbonate: 37.8 mmol/L — ABNORMAL HIGH (ref 20.0–28.0)
Bicarbonate: 39.2 mmol/L — ABNORMAL HIGH (ref 20.0–28.0)
Bicarbonate: 39.1 mmol/L — ABNORMAL HIGH (ref 20.0–28.0)
Bicarbonate: 38.5 mmol/L — ABNORMAL HIGH (ref 20.0–28.0)
Calcium, Ion: 1.22 mmol/L (ref 1.15–1.40)
Calcium, Ion: 1.21 mmol/L (ref 1.15–1.40)
Calcium, Ion: 1.19 mmol/L (ref 1.15–1.40)
Calcium, Ion: 1.17 mmol/L (ref 1.15–1.40)
HCT: 30 % — ABNORMAL LOW (ref 39.0–52.0)
HCT: 29 % — ABNORMAL LOW (ref 39.0–52.0)
HCT: 33 % — ABNORMAL LOW (ref 39.0–52.0)
HCT: 25 % — ABNORMAL LOW (ref 39.0–52.0)
Hemoglobin: 10.2 g/dL — ABNORMAL LOW (ref 13.0–17.0)
Hemoglobin: 9.9 g/dL — ABNORMAL LOW (ref 13.0–17.0)
Hemoglobin: 11.2 g/dL — ABNORMAL LOW (ref 13.0–17.0)
Hemoglobin: 8.5 g/dL — ABNORMAL LOW (ref 13.0–17.0)
O2 Saturation: 93 %
O2 Saturation: 91 %
O2 Saturation: 93 %
O2 Saturation: 96 %
Patient temperature: 98.7
Patient temperature: 37.6
Patient temperature: 37.6
Patient temperature: 99.3
Potassium: 3.7 mmol/L (ref 3.5–5.1)
Potassium: 4.1 mmol/L (ref 3.5–5.1)
Potassium: 4.4 mmol/L (ref 3.5–5.1)
Potassium: 4.5 mmol/L (ref 3.5–5.1)
Sodium: 148 mmol/L — ABNORMAL HIGH (ref 135–145)
Sodium: 147 mmol/L — ABNORMAL HIGH (ref 135–145)
Sodium: 147 mmol/L — ABNORMAL HIGH (ref 135–145)
Sodium: 147 mmol/L — ABNORMAL HIGH (ref 135–145)
TCO2: 40 mmol/L — ABNORMAL HIGH (ref 22–32)
TCO2: 42 mmol/L — ABNORMAL HIGH (ref 22–32)
TCO2: 42 mmol/L — ABNORMAL HIGH (ref 22–32)
TCO2: 40 mmol/L — ABNORMAL HIGH (ref 22–32)
pCO2 arterial: 82.2 mmHg (ref 32.0–48.0)
pCO2 arterial: 86 mmHg (ref 32.0–48.0)
pCO2 arterial: 83.7 mmHg (ref 32.0–48.0)
pCO2 arterial: 54.2 mmHg — ABNORMAL HIGH (ref 32.0–48.0)
pH, Arterial: 7.27 — ABNORMAL LOW (ref 7.350–7.450)
pH, Arterial: 7.27 — ABNORMAL LOW (ref 7.350–7.450)
pH, Arterial: 7.28 — ABNORMAL LOW (ref 7.350–7.450)
pH, Arterial: 7.461 — ABNORMAL HIGH (ref 7.350–7.450)
pO2, Arterial: 82 mmHg — ABNORMAL LOW (ref 83.0–108.0)
pO2, Arterial: 76 mmHg — ABNORMAL LOW (ref 83.0–108.0)
pO2, Arterial: 84 mmHg (ref 83.0–108.0)
pO2, Arterial: 82 mmHg — ABNORMAL LOW (ref 83.0–108.0)

## 2019-04-07 LAB — BASIC METABOLIC PANEL
Anion gap: 8 (ref 5–15)
BUN: 49 mg/dL — ABNORMAL HIGH (ref 6–20)
CO2: 36 mmol/L — ABNORMAL HIGH (ref 22–32)
Calcium: 8.1 mg/dL — ABNORMAL LOW (ref 8.9–10.3)
Chloride: 107 mmol/L (ref 98–111)
Creatinine, Ser: 0.86 mg/dL (ref 0.61–1.24)
GFR calc Af Amer: >60 mL/min (ref >60)
GFR calc non Af Amer: >60 mL/min (ref >60)
Glucose, Bld: 138 mg/dL — ABNORMAL HIGH (ref 70–99)
Potassium: 4.1 mmol/L (ref 3.5–5.1)
Sodium: 151 mmol/L — ABNORMAL HIGH (ref 135–145)

## 2019-04-07 LAB — PROTIME-INR
INR: 1 (ref 0.8–1.2)
Prothrombin Time: 13.2 seconds (ref 11.4–15.2)

## 2019-04-07 LAB — GLUCOSE, CAPILLARY
Glucose-Capillary: 135 mg/dL — ABNORMAL HIGH (ref 70–99)
Glucose-Capillary: 121 mg/dL — ABNORMAL HIGH (ref 70–99)
Glucose-Capillary: 158 mg/dL — ABNORMAL HIGH (ref 70–99)
Glucose-Capillary: 166 mg/dL — ABNORMAL HIGH (ref 70–99)
Glucose-Capillary: 144 mg/dL — ABNORMAL HIGH (ref 70–99)
Glucose-Capillary: 163 mg/dL — ABNORMAL HIGH (ref 70–99)

## 2019-04-07 LAB — PHOSPHORUS: Phosphorus: 4.2 mg/dL (ref 2.5–4.6)

## 2019-04-07 MED ORDER — HEPARIN BOLUS VIA INFUSION
1500.0000 [IU] | Freq: Once | INTRAVENOUS | Status: AC
  Administered 2019-04-07: 1500 [IU] via INTRAVENOUS
  Filled 2019-04-07: qty 1500

## 2019-04-07 MED ORDER — HEPARIN (PORCINE) 25000 UT/250ML-% IV SOLN
2050.0000 [IU]/h | INTRAVENOUS | Status: AC
  Administered 2019-04-07 – 2019-04-08 (×2): 1700 [IU]/h via INTRAVENOUS
  Administered 2019-04-08 – 2019-04-09 (×2): 1950 [IU]/h via INTRAVENOUS
  Administered 2019-04-10 – 2019-04-12 (×5): 2050 [IU]/h via INTRAVENOUS
  Filled 2019-04-07 (×9): qty 250

## 2019-04-07 MED ORDER — GUAIFENESIN 100 MG/5ML PO SOLN
10.0000 mL | Freq: Two times a day (BID) | ORAL | Status: DC
  Administered 2019-04-07 – 2019-04-11 (×9): 200 mg
  Filled 2019-04-07: qty 15
  Filled 2019-04-07: qty 30
  Filled 2019-04-07 (×3): qty 15
  Filled 2019-04-07: qty 30
  Filled 2019-04-07: qty 15
  Filled 2019-04-07: qty 30

## 2019-04-07 MED ORDER — FREE WATER
500.0000 mL | Status: DC
  Administered 2019-04-07 – 2019-04-11 (×23): 500 mL

## 2019-04-07 MED ORDER — FUROSEMIDE 10 MG/ML IJ SOLN
40.0000 mg | Freq: Once | INTRAMUSCULAR | Status: AC
  Administered 2019-04-07: 11:00:00 40 mg via INTRAVENOUS
  Filled 2019-04-07: qty 4

## 2019-04-07 MED ORDER — AMIODARONE HCL 200 MG PO TABS
200.0000 mg | ORAL_TABLET | Freq: Every day | ORAL | Status: DC
  Administered 2019-04-07 – 2019-04-08 (×2): 200 mg via ORAL
  Filled 2019-04-07 (×2): qty 1

## 2019-04-07 NOTE — Plan of Care (Signed)
Patient Summary: Several events throughout the night. 2100 Patient start SB on with monitor HR low as 37. Dr. Corinna Lines updated re: HR low as 37 with pauses. Longest noted pause was >4sec. New orders received. See MAR re: medications administered and held. See gtt titration flowseet. All adverse events noted on flowsheet. Remains on ventilator with no increase in O2 requirement. Family updated via phone before 2200. Addressed all questions and concerns. VSS. Patient remains on Phenylephrine an dilaudid gtt. All lines and tubes remain intact. Bed in low locked position. Plan of care continued. Problem: Coping: Goal: Psychosocial and spiritual needs will be supported Outcome: Progressing   Problem: Respiratory: Goal: Will maintain a patent airway Outcome: Progressing Goal: Complications related to the disease process, condition or treatment will be avoided or minimized Outcome: Progressing   Problem: Education: Goal: Knowledge of General Education information will improve Description: Including pain rating scale, medication(s)/side effects and non-pharmacologic comfort measures Outcome: Progressing   Problem: Health Behavior/Discharge Planning: Goal: Ability to manage health-related needs will improve Outcome: Progressing   Problem: Clinical Measurements: Goal: Ability to maintain clinical measurements within normal limits will improve Outcome: Progressing Goal: Will remain free from infection Outcome: Progressing Goal: Diagnostic test results will improve Outcome: Progressing Goal: Respiratory complications will improve Outcome: Progressing Goal: Cardiovascular complication will be avoided Outcome: Progressing   Problem: Activity: Goal: Risk for activity intolerance will decrease Outcome: Progressing   Problem: Nutrition: Goal: Adequate nutrition will be maintained Outcome: Progressing   Problem: Coping: Goal: Level of anxiety will decrease Outcome: Progressing   Problem:  Elimination: Goal: Will not experience complications related to bowel motility Outcome: Progressing Goal: Will not experience complications related to urinary retention Outcome: Progressing   Problem: Pain Managment: Goal: General experience of comfort will improve Outcome: Progressing   Problem: Safety: Goal: Ability to remain free from injury will improve Outcome: Progressing   Problem: Skin Integrity: Goal: Risk for impaired skin integrity will decrease Outcome: Progressing

## 2019-04-07 NOTE — Progress Notes (Signed)
Critical care attending progress note  Reevaluated the patient following ABG by RT showing persistent hypercarbia.  Patient switched to [facility] and respiratory rate increased.  No ventilator dyssynchrony, MV of 14L/min  I performed a POC ultrasound to help delineate cause.  Echo: Normal LV size and low normal function. RV mildly dilated and function visually mildly reduced with questionable septal dyskinesis. Low PVAT but normal PV VTI, normal TAPSE and S' but reduced FAC. No significant valvular lesions. IVC not dilated.  No DVT in LE by CUS. Prominent interstitial pattern on lung ultrasound diffusely.   No clear circumstantial evidence to support VTE as cause of increase dead-space ventilation.  Possible mild RV impairment, which may be related to severe ARDS.  Recheck ABG on current settings. If improvement, continue current treatment and assume problem was inadequate ventilatory support. If hypercarbia persists would have low threshold for anticoagulation for PE or COVID related thrombosis in situ.  CRITICAL CARE Performed by: Kipp Brood   Total critical care time: 30 minutes  Critical care time was exclusive of separately billable procedures and treating other patients.  Critical care was necessary to treat or prevent imminent or life-threatening deterioration.  Critical care was time spent personally by me on the following activities: development of treatment plan with patient and/or surrogate as well as nursing, discussions with consultants, evaluation of patient's response to treatment, examination of patient, obtaining history from patient or surrogate, ordering and performing treatments and interventions, ordering and review of laboratory studies, ordering and review of radiographic studies, pulse oximetry, re-evaluation of patient's condition and participation in multidisciplinary rounds.  Kipp Brood, MD [facility] ICU Physician [place]  Pager:  [phone] Mobile: [phone] After hours: [phone].

## 2019-04-07 NOTE — Progress Notes (Signed)
Notified E-Link nurse Fernand Parkins RN of arterial results.

## 2019-04-07 NOTE — Progress Notes (Signed)
[place] Progress Note Patient Name: Angel Stuart DOB: 1960/09/21 MRN: [id number]   Date of Service  04/07/2019  HPI/Events of Note  ABG on 40%/PC 16/Rate 20/P 5 = 7.27/82.2/  eICU Interventions  Will order: 1. Increase rate to 24. 2. Repeat ABG at 6 AM.     Intervention Category Major Interventions: Acid-Base disturbance - evaluation and management;Respiratory failure - evaluation and management  Hanan Moen Cornelia Copa 04/07/2019, 4:31 AM

## 2019-04-07 NOTE — Progress Notes (Addendum)
ANTICOAGULATION CONSULT NOTE - Initial Consult  Pharmacy Consult for Heparin Indication: Rule out VTE  Allergies  Allergen Reactions  . Clindamycin Hcl Other (See Comments)    Other reaction(s): Other (See Comments) Other reaction(s): ANAPHYLAXIS Other reaction(s): ANAPHYLAXIS Other reaction(s): Other (See Comments) Other reaction(s): ANAPHYLAXIS Other reaction(s): ANAPHYLAXIS Other reaction(s): ANAPHYLAXIS   . 5-Alpha Reductase Inhibitors   . Semaglutide Nausea And Vomiting  . Hydromorphone Nausea And Vomiting    Per patient Per patient Per patient     Patient Measurements: Height: 5\' 11"  (180.3 cm) Weight: 231 lb 14.8 oz (105.2 kg) IBW/kg (Calculated) : 75.3 Heparin Dosing Weight: 97.4 kg  Vital Signs: Temp: 100 F (37.8 C) (11/08 1530) Temp Source: Axillary (11/08 1117) BP: 125/63 (11/08 1530) Pulse Rate: 111 (11/08 1530)  Labs: Recent Labs    04/05/19 0238  04/06/19 0546  04/07/19 0340 04/07/19 0555 04/07/19 1436 04/07/19 1807  HGB 11.2*   < > 10.0*   < > 10.2*  --  9.9* 11.2*  HCT 40.2   < > 35.1*   < > 30.0*  --  29.0* 33.0*  PLT 275  --  253  --   --   --   --   --   CREATININE 1.11  --  0.92  --   --  0.86  --   --    < > = values in this interval not displayed.    Estimated Creatinine Clearance: 114.2 mL/min (by C-G formula based on SCr of 0.86 mg/dL).   Medical History: No past medical history on file.  Medications:  Scheduled:  . amiodarone  200 mg Oral Daily  . chlorhexidine  15 mL Mouth/Throat BID  . Chlorhexidine Gluconate Cloth  6 each Topical Daily  . clonazePAM  1 mg Per Tube BID  . enoxaparin (LOVENOX) injection  55 mg Subcutaneous Q12H  . feeding supplement (PRO-STAT SUGAR FREE 64)  60 mL Per Tube TID  . free water  500 mL Per Tube Q4H  . guaiFENesin  10 mL Per Tube BID  . insulin aspart  0-20 Units Subcutaneous Q4H  . insulin aspart  14 Units Subcutaneous Q4H  . insulin glargine  50 Units Subcutaneous BID  . mouth rinse  15  mL Mouth Rinse 10 times per day  . midodrine  5 mg Per Tube Q8H  . pantoprazole sodium  40 mg Per Tube Daily  . phosphorus  500 mg Per Tube TID  . psyllium  1 packet Per Tube BID   Infusions:  . sodium chloride 10 mL/hr at 04/07/19 1200  . ceFEPime (MAXIPIME) IV 2 g (04/07/19 1418)  . feeding supplement (VITAL 1.5 CAL) 1,000 mL (04/07/19 1040)  . HYDROmorphone 4 mg/hr (04/07/19 1254)  . phenylephrine (NEO-SYNEPHRINE) Adult infusion Stopped (04/07/19 0749)   PRN: acetaminophen (TYLENOL) oral liquid 160 mg/5 mL, bisacodyl, chlorpheniramine-HYDROcodone, HYDROmorphone, LORazepam, metoprolol tartrate, sennosides  Assessment: 58 yo male admitted 03/21/2019 with COVID-19 pneumonia requiring mechanical ventilation.  He has been receiving VTE prophylaxis with lovenox 0.5mg /kg SQ q12h, LE dopplers negative on 11/1.  Pharmacy consulted to begin IV heparin on 04/07/19 for r/o PE/DVT with persistent hypercarbia.  Goal of Therapy:  Heparin level 0.3-0.7 units/ml Monitor platelets by anticoagulation protocol: Yes   Plan:   Reduced-dose heparin bolus 1500 units since received Lovenox at 10:41 this AM  Begin IV heparin infusion 1700 units/hr (~18 units/kg/hr)  Check heparin level in 8hr  Daily heparin level and CBC  Monitor for signs/symptoms of bleeding  Peggyann Juba, PharmD, BCPS Pharmacy: [phone] 04/07/2019,6:23 PM

## 2019-04-07 NOTE — Progress Notes (Addendum)
NAME:  Angel Stuart, MRN:  [id number], DOB:  March 10, 1961, LOS: 13 ADMISSION DATE:  2019-04-13, CONSULTATION DATE:  10/26 REFERRING MD:  Sharon Seller, CHIEF COMPLAINT:  Dyspnea   Brief History   58 y/o male admitted on 10/26 with ARDS from COVID 19 pneumonia required intubation in the [facility] ED.    Past Medical History  HTN HLD DM2  Significant Hospital Events   10/26 admission to ICU, prone position 10/30 culture positive pseudomonas 11/2 worsening oxygenation 11/5 minimal ventilator settings, some air hunger on exam 11/6 atrial fib with RVR  Consults:  PCCM  Procedures:  10/26 ETT >  10/26 L subclavian CVL > 11/4  Significant Diagnostic Tests:  11/1 Lower extremity doppler ultrasound> negative for DVT 11/5 TTE > LVEF 60-65%, mild LVH, normal RV 11/6 chest x-ray-persistent bilateral interstitial infiltrates consistent with SARS-CoV-2 pneumonia.  Minimal change since 11/2 (personally reviewed)  Micro Data:  10/29 blood >  10/30 sputum > pseudomonas, gram negative rods, GPC; staph aureus (MSSA)  Antimicrobials/COVID RX:  10/27 Remdesivir > 10/30 10/27 decadron > 11/4  10/30 vanc > 11/2 10/30 zosyn > 11/2  11/2 ceftaz > 11/5  11/7 cefepime > 11/14  Interim history/subjective:   Dyssynchrony has improved overnight.  No longer febrile  Objective   Blood pressure 122/62, pulse 87, temperature 97.7 F (36.5 C), temperature source Esophageal, resp. rate (!) 27, height 5\' 11"  (1.803 m), weight 105.2 kg, SpO2 100 %.    Vent Mode: PCV FiO2 (%):  [40 %] 40 % Set Rate:  [20 bmp-24 bmp] 24 bmp PEEP:  [12 cmH20] 12 cmH20 Plateau Pressure:  [22 cmH20-29 cmH20] 24 cmH20   Intake/Output Summary (Last 24 hours) at 04/07/2019 0838 Last data filed at 04/07/2019 0700 Gross per 24 hour  Intake 7204.88 ml  Output 2200 ml  Net 5004.88 ml   Filed Weights   04/01/19 0500 04/02/19 0419 04/03/19 0645  Weight: 105.3 kg 105.4 kg 105.2 kg    Examination:  General:   Intubated and sedated. Obese HENT: ETT/OGT in place with no pressure injury PULM: bronchial breathing as bases posteriorly, ventilator dyssynchrony resolved. Copious white secretions.  Maintaining saturation.  CV: RRR, no mgr GI: BS+, soft, nontender MSK: normal bulk and tonet Neuro: Opens eyes faintly to voice.  Weakly moves all limbs.  Shivering from cooling blanket.  Resolved Hospital Problem list     Assessment & Plan:   Critically ill due to acute hypoxic and hypercapneic respiratory failure requiring mechanical ventilation.  On lung protective ventilation protocol. Ventilator dyssynchrony has improved. Remains hypercarbic - high dead space ventilation.  - Wean sedation. - Continue lung protective ventilation.  -Follow CO2, maintain MV > 13L/min - Continue frequent suctioning  - Start diuresis.  ARDS due to COVID 19 pneumonia Secondary bacterial pneumonia due to P.aeruginosa and MSSA. Completed course of treatment for COVID-19 - Continue antibacterial coverage for both organisms given copious purulent secretions.  Complete 7-day course. - Guaifenasin as mucolytic  Was critically ill due to hypotension requiring titration of phenylephrine, likely sedation related Now off vasopressors  Hypernatremia Secondary to insensible losses from fever - continue free water repletion.  Best practice:  Diet: continue tube feeding Pain/Anxiety/Delirium protocol (if indicated): dilaudid and dexmedetomidine. Lorazepam prn. VAP protocol (if indicated): yes DVT prophylaxis: lovenox 0.5mg  sub q bid GI prophylaxis: Pantoprazole for stress ulcer prophylaxis Glucose control: SSI Mobility: bed rest Code Status: Full Family Communication: Dr 13/04/20 updated his wife by phone on 11/8.  He informed her that he  was making progress with sedation and ventilator weaning.  He told her that he optimistically thought that he may be able to start weaning trials by 11/11 Disposition: remain in  ICU  Labs   CBC: Recent Labs  Lab 04/02/19 0400  04/03/19 0645  04/04/19 0546 04/05/19 0238 04/05/19 0419 04/06/19 0546 04/06/19 0952 04/07/19 0340  WBC 7.1  --  6.6  --  5.3 4.7  --  5.1  --   --   HGB 14.2   < > 13.5   < > 12.7* 11.2* 10.5* 10.0* 9.9* 10.2*  HCT 49.0   < > 45.2   < > 42.5 40.2 31.0* 35.1* 29.0* 30.0*  MCV 95.1  --  94.0  --  94.2 99.0  --  98.0  --   --   PLT 380  --  371  --  338 275  --  253  --   --    < > = values in this interval not displayed.    Basic Metabolic Panel: Recent Labs  Lab 04/01/19 0521  04/02/19 0400  04/03/19 0645  04/04/19 0546 04/05/19 0238 04/05/19 0419 04/06/19 0546 04/06/19 0952 04/07/19 0340  NA 146*   < > 150*   < > 151*   < > 150* 155* 153* 154* 151* 148*  K 3.8   < > 4.5   < > 3.6   < > 3.8 4.4 4.3 4.0 3.4* 3.7  CL 101  --  107  --  108  --  107 109  --  108  --   --   CO2 29  --  34*  --  34*  --  34* 39*  --  39*  --   --   GLUCOSE 155*  --  149*  --  161*  --  396* 362*  --  200*  --   --   BUN 103*  --  104*  --  96*  --  92* 71*  --  53*  --   --   CREATININE 1.55*  --  1.61*  --  1.34*  --  1.50* 1.11  --  0.92  --   --   CALCIUM 8.2*  --  8.5*  --  8.3*  --  8.2* 8.2*  --  8.2*  --   --   MG 2.7*  --   --   --   --   --   --  2.9*  --   --   --   --   PHOS 3.7  --   --   --   --   --   --  3.7  --  1.1*  --   --    < > = values in this interval not displayed.   GFR: Estimated Creatinine Clearance: 106.8 mL/min (by C-G formula based on SCr of 0.92 mg/dL). Recent Labs  Lab 04/01/19 0521 04/02/19 0400 04/03/19 0645 04/04/19 0546 04/05/19 0238 04/06/19 0546  PROCALCITON 1.08 0.50  --   --   --   --   WBC 11.2* 7.1 6.6 5.3 4.7 5.1    Liver Function Tests: Recent Labs  Lab 04/03/19 0645 04/04/19 0546 04/05/19 0238 04/06/19 0546  AST 100* 40 22  --   ALT 182* 113* 70*  --   ALKPHOS 49 44 46  --   BILITOT 0.9 0.6 0.6  --   PROT 6.0* 5.7* 5.8*  --   ALBUMIN 2.1*  2.0* 2.3* 2.0*   No results for  input(s): LIPASE, AMYLASE in the last 168 hours. No results for input(s): AMMONIA in the last 168 hours.  ABG    Component Value Date/Time   PHART 7.270 (L) 04/07/2019 0340   PCO2ART 82.2 (HH) 04/07/2019 0340   PO2ART 82.0 (L) 04/07/2019 0340   HCO3 37.8 (H) 04/07/2019 0340   TCO2 40 (H) 04/07/2019 0340   ACIDBASEDEF 3.0 (H) 03/03/2019 1442   O2SAT 93.0 04/07/2019 0340     Coagulation Profile: No results for input(s): INR, PROTIME in the last 168 hours.  Cardiac Enzymes: No results for input(s): CKTOTAL, CKMB, CKMBINDEX, TROPONINI in the last 168 hours.  HbA1C: Hgb A1c MFr Bld  Date/Time Value Ref Range Status  03/05/2019 01:00 PM 7.5 (H) 4.8 - 5.6 % Final    Comment:    (NOTE) Pre diabetes:          5.7%-6.4% Diabetes:              >6.4% Glycemic control for   <7.0% adults with diabetes     CBG: Recent Labs  Lab 04/06/19 1609 04/06/19 2114 04/07/19 0049 04/07/19 0537 04/07/19 0741  GLUCAP 155* 134* 135* 121* 158*     CRITICAL CARE Performed by: Lynnell Catalanavi Yordi Krager   Total critical care time: 40 minutes  Critical care time was exclusive of separately billable procedures and treating other patients.  Critical care was necessary to treat or prevent imminent or life-threatening deterioration.  Critical care was time spent personally by me on the following activities: development of treatment plan with patient and/or surrogate as well as nursing, discussions with consultants, evaluation of patient's response to treatment, examination of patient, obtaining history from patient or surrogate, ordering and performing treatments and interventions, ordering and review of laboratory studies, ordering and review of radiographic studies, pulse oximetry, re-evaluation of patient's condition and participation in multidisciplinary rounds.  Lynnell Catalanavi Emmanuela Ghazi, MD [facility] ICU Physician [facility]  Pager: [phone] Mobile: [phone] After hours:  [phone].

## 2019-04-07 NOTE — Progress Notes (Signed)
Angel Stuart  NWG:[id number] DOB: 06-Oct-1960 DOA: 03/22/2019 PCP: Lenox Ponds, MD    Brief Narrative:  58 year old with a history of hyperlipidemia and DM 2 who began to develop upper respiratory symptoms on 10/5, and ultimately tested positive for Covid on 10/9.  Records suggest he experienced a gradual progression of symptoms with acute worsening of shortness of breath on 10/25, which prompted him to present to the [facility] ED.  In the [facility] ED he was noted to have a saturation of 63% on room air.  Chest x-ray noted severe bilateral infiltrates.  He rapidly decompensated in the emergency room.  He failed a trial of BiPAP and had to be intubated.  He was subsequently transferred to [facility] to the ICU.    Significant Events: 10/05 symptom onset  10/09 positive COVID test 10/25 presented to [facility] ED - intubated  10/26 transfer to [facility] ICU 11/1 bilateral lower extremity venous duplex negative for DVT  11/5 TTE  COVID-19 specific Treatment: Decadron 10/26 > 11/4 Remdesivir 10/26 > 10/30 Convalescent plasma 10/27  Subjective: Sedated on ventilator.  Nurse has noted urinary retention today.  She is going to continue to BladderScan and monitor this closely with I&O catheters as necessary.  It may soon be necessary to replace his Foley.  Assessment & Plan:  Covid pneumonia -ARDS -acute hypoxic respiratory failure Ventilator management per PCCM - decadron and remdesivir courses have been completed - transfused convalescent plasma 10/27 AM  Pseudomonas and MSSA on tracheal aspirate -relapsing fevers Has completed 7 days of antibiotic therapy and remains afebrile - PCCM intends to extend antibiotic course given copious airway secretions  Acute atrial fibrillation with RVR Heart rate controlled - TSH is normal - continue amiodarone short-term  Hypernatremia Increase enteral free water and monitor  Recent Labs  Lab 04/05/19 0419 04/06/19 0546 04/06/19 0952  04/07/19 0340 04/07/19 0555  NA 153* 154* 151* 148* 151*    Hypotension - Septic shock Resolved with patient's blood pressure now stable  Acute renal failure Creatinine has normalized with volume resuscitation   DM 2 A1c 7.5 -CBG well controlled  Mild transaminitis Improving  Hypophosphatemia Corrected with supplementation  Obesity - Estimated body mass index is 32.35 kg/m as calculated from the following:   Height as of this encounter:  (1.803 m).   Weight as of this encounter: 105.2 kg.   DVT prophylaxis: lovenox  Code Status: FULL CODE Family Communication: Per PCCM Disposition Plan: ICU  Consultants:  none  Antimicrobials:  Vancomycin 10/30 > 11/2 Zosyn 10/30 > 11/2 Elita Quick 11/2 > 11/5 Cefepime 11/7 >  Objective: Blood pressure 122/62, pulse 87, temperature 97.7 F (36.5 C), temperature source Esophageal, resp. rate (!) 27, height  (1.803 m), weight 105.2 kg, SpO2 98 %.  Intake/Output Summary (Last 24 hours) at 04/07/2019 0952 Last data filed at 04/07/2019 0700 Gross per 24 hour  Intake 7204.88 ml  Output 2200 ml  Net 5004.88 ml   Filed Weights   04/01/19 0500 04/02/19 0419 04/03/19 0645  Weight: 105.3 kg 105.4 kg 105.2 kg    Examination: General: on ventilator - sedated  Lungs: Diffuse fine crackles with no wheezing Cardiovascular: Irregularly irregular - rate controlled  Abdomen: Obese, soft Extremities: No edema B lower extremities  CBC: Recent Labs  Lab 04/04/19 0546 04/05/19 0238  04/06/19 0546 04/06/19 0952 04/07/19 0340  WBC 5.3 4.7  --  5.1  --   --   HGB 12.7* 11.2*   < > 10.0*  9.9* 10.2*  HCT 42.5 40.2   < > 35.1* 29.0* 30.0*  MCV 94.2 99.0  --  98.0  --   --   PLT 338 275  --  253  --   --    < > = values in this interval not displayed.   Basic Metabolic Panel: Recent Labs  Lab 04/01/19 0521  04/05/19 0238  04/06/19 0546 04/06/19 0952 04/07/19 0340 04/07/19 0555  NA 146*   < > 155*   < > 154* 151* 148*  151*  K 3.8   < > 4.4   < > 4.0 3.4* 3.7 4.1  CL 101   < > 109  --  108  --   --  107  CO2 29   < > 39*  --  39*  --   --  36*  GLUCOSE 155*   < > 362*  --  200*  --   --  138*  BUN 103*   < > 71*  --  53*  --   --  49*  CREATININE 1.55*   < > 1.11  --  0.92  --   --  0.86  CALCIUM 8.2*   < > 8.2*  --  8.2*  --   --  8.1*  MG 2.7*  --  2.9*  --   --   --   --   --   PHOS 3.7  --  3.7  --  1.1*  --   --  4.2   < > = values in this interval not displayed.   GFR: Estimated Creatinine Clearance: 114.2 mL/min (by C-G formula based on SCr of 0.86 mg/dL).  Liver Function Tests: Recent Labs  Lab 04/03/19 0645 04/04/19 0546 04/05/19 0238 04/06/19 0546  AST 100* 40 22  --   ALT 182* 113* 70*  --   ALKPHOS 49 44 46  --   BILITOT 0.9 0.6 0.6  --   PROT 6.0* 5.7* 5.8*  --   ALBUMIN 2.1* 2.0* 2.3* 2.0*    HbA1C: Hgb A1c MFr Bld  Date/Time Value Ref Range Status  06-21-18 01:00 PM 7.5 (H) 4.8 - 5.6 % Final    Comment:    (NOTE) Pre diabetes:          5.7%-6.4% Diabetes:              >6.4% Glycemic control for   <7.0% adults with diabetes     CBG: Recent Labs  Lab 04/06/19 1609 04/06/19 2114 04/07/19 0049 04/07/19 0537 04/07/19 0741  GLUCAP 155* 134* 135* 121* 158*    Recent Results (from the past 240 hour(s))  Culture, blood (routine x 2)     Status: None   Collection Time: 03/28/19 11:02 PM   Specimen: BLOOD  Result Value Ref Range Status   Specimen Description   Final    BLOOD RIGHT ANTECUBITAL Performed at [facility], [address] [place], [place] [address]    Special Requests   Final    BOTTLES DRAWN AEROBIC AND ANAEROBIC Blood Culture adequate volume Performed at [facility], [address] [place], [place] [address]    Culture   Final    NO GROWTH 5 DAYS Performed at [facility], [address] [place], [place] [address]    Report Status 04/03/2019 FINAL  Final  Culture, blood (routine x 2)      Status: None   Collection Time: 03/28/19 11:40  PM   Specimen: BLOOD LEFT HAND  Result Value Ref Range Status   Specimen Description   Final    BLOOD LEFT HAND Performed at [facility] [address] [place], [place] [address]    Special Requests   Final    BOTTLES DRAWN AEROBIC AND ANAEROBIC Blood Culture adequate volume Performed at [facility] [address] [place], [place] [address]    Culture   Final    NO GROWTH 5 DAYS Performed at [facility], [place] [address] [place], [place] [address]    Report Status 04/03/2019 FINAL  Final  Expectorated sputum assessment w rflx to resp cult     Status: None   Collection Time: 03/29/19 12:50 AM   Specimen: Expectorated Sputum  Result Value Ref Range Status   Specimen Description EXPECTORATED SPUTUM  Final   Special Requests Immunocompromised  Final   Sputum evaluation   Final    THIS SPECIMEN IS ACCEPTABLE FOR SPUTUM CULTURE Performed at [facility], [place] [address], [place], [place] [address]    Report Status 03/29/2019 FINAL  Final  Culture, respiratory     Status: None   Collection Time: 03/29/19 12:50 AM  Result Value Ref Range Status   Specimen Description   Final    EXPECTORATED SPUTUM Performed at [facility] [address], [place], [place] [address]    Special Requests   Final    Immunocompromised Reflexed from [id number] Performed at [facility], [place] [address], [place], [place] [address]    Gram Stain   Final    MODERATE WBC PRESENT, PREDOMINANTLY PMN ABUNDANT GRAM NEGATIVE RODS MODERATE GRAM POSITIVE COCCI Performed at [facility], [place] [address], [place], [place] [address]    Culture   Final    MODERATE PSEUDOMONAS AERUGINOSA MODERATE STAPHYLOCOCCUS AUREUS    Report Status 04/03/2019 FINAL  Final   Organism ID, Bacteria PSEUDOMONAS AERUGINOSA  Final   Organism ID, Bacteria STAPHYLOCOCCUS AUREUS   Final      Susceptibility   Pseudomonas aeruginosa - MIC*    CEFTAZIDIME 4 SENSITIVE Sensitive     CIPROFLOXACIN <=0.25 SENSITIVE Sensitive     GENTAMICIN 4 SENSITIVE Sensitive     IMIPENEM 2 SENSITIVE Sensitive     PIP/TAZO 16 SENSITIVE Sensitive     CEFEPIME 4 SENSITIVE Sensitive     * MODERATE PSEUDOMONAS AERUGINOSA   Staphylococcus aureus - MIC*    CIPROFLOXACIN <=0.5 SENSITIVE Sensitive     ERYTHROMYCIN <=0.25 SENSITIVE Sensitive     GENTAMICIN <=0.5 SENSITIVE Sensitive     OXACILLIN <=0.25 SENSITIVE Sensitive     TETRACYCLINE <=1 SENSITIVE Sensitive     VANCOMYCIN <=0.5 SENSITIVE Sensitive     TRIMETH/SULFA <=10 SENSITIVE Sensitive     CLINDAMYCIN <=0.25 SENSITIVE Sensitive     RIFAMPIN <=0.5 SENSITIVE Sensitive     Inducible Clindamycin NEGATIVE Sensitive     * MODERATE STAPHYLOCOCCUS AUREUS  Culture, respiratory (non-expectorated)     Status: None (Preliminary result)   Collection Time: 04/06/19  9:55 AM   Specimen: Tracheal Aspirate; Respiratory  Result Value Ref Range Status   Specimen Description   Final    TRACHEAL ASPIRATE Performed at [facility] [address], [place], [place] [address]    Special Requests NONE  Final   Gram Stain   Final    RARE WBC PRESENT, PREDOMINANTLY PMN FEW GRAM NEGATIVE RODS Performed at [facility], [address], [place], [place] [address]    Culture PENDING  Incomplete   Report Status PENDING  Incomplete     Scheduled Meds: . amiodarone  200 mg Oral Daily  . chlorhexidine  15 mL Mouth/Throat BID  . Chlorhexidine Gluconate Cloth  6 each Topical Daily  . clonazePAM  1 mg Per Tube BID  . enoxaparin (LOVENOX) injection  55 mg Subcutaneous Q12H  . feeding supplement (PRO-STAT SUGAR FREE 64)  60 mL Per Tube TID  . free water  400 mL Per Tube Q4H  . furosemide  40 mg Intravenous Once  . guaiFENesin  10 mL Per Tube BID  . insulin aspart  0-20 Units Subcutaneous Q4H  . insulin aspart  14 Units  Subcutaneous Q4H  . insulin glargine  50 Units Subcutaneous BID  . mouth rinse  15 mL Mouth Rinse 10 times per day  . midodrine  5 mg Per Tube Q8H  . pantoprazole sodium  40 mg Per Tube Daily  . phosphorus  500 mg Per Tube TID  . psyllium  1 packet Per Tube BID   Continuous Infusions: . sodium chloride 10 mL/hr at 04/07/19 0700  . ceFEPime (MAXIPIME) IV Stopped (04/07/19 8768)  . feeding supplement (VITAL 1.5 CAL) 1,000 mL (04/07/19 0132)  . HYDROmorphone 4 mg/hr (04/07/19 0700)  . phenylephrine (NEO-SYNEPHRINE) Adult infusion 30 mcg/min (04/07/19 0700)     LOS: 13 days   Lonia Blood, MD [facility] Office  [phone] Pager - Text Page per Amion  If 7PM-7AM, please contact night-coverage per Amion 04/07/2019, 9:52 AM

## 2019-04-07 NOTE — Plan of Care (Signed)
Pt intubated and sedated 

## 2019-04-08 ENCOUNTER — Other Ambulatory Visit: Payer: Self-pay

## 2019-04-08 ENCOUNTER — Encounter (HOSPITAL_COMMUNITY): Payer: PRIVATE HEALTH INSURANCE

## 2019-04-08 LAB — CBC
HCT: 30.4 % — ABNORMAL LOW (ref 39.0–52.0)
Hemoglobin: 8.8 g/dL — ABNORMAL LOW (ref 13.0–17.0)
MCH: 27.4 pg (ref 26.0–34.0)
MCHC: 28.9 g/dL — ABNORMAL LOW (ref 30.0–36.0)
MCV: 94.7 fL (ref 80.0–100.0)
Platelets: 230 10*3/uL (ref 150–400)
RBC: 3.21 MIL/uL — ABNORMAL LOW (ref 4.22–5.81)
RDW: 14.5 % (ref 11.5–15.5)
WBC: 5.7 10*3/uL (ref 4.0–10.5)
nRBC: 0.4 % — ABNORMAL HIGH (ref 0.0–0.2)

## 2019-04-08 LAB — POCT I-STAT 7, (LYTES, BLD GAS, ICA,H+H)
Acid-Base Excess: 13 mmol/L — ABNORMAL HIGH (ref 0.0–2.0)
Acid-Base Excess: 12 mmol/L — ABNORMAL HIGH (ref 0.0–2.0)
Bicarbonate: 37.5 mmol/L — ABNORMAL HIGH (ref 20.0–28.0)
Bicarbonate: 37.2 mmol/L — ABNORMAL HIGH (ref 20.0–28.0)
Calcium, Ion: 1.17 mmol/L (ref 1.15–1.40)
Calcium, Ion: 1.15 mmol/L (ref 1.15–1.40)
HCT: 25 % — ABNORMAL LOW (ref 39.0–52.0)
HCT: 25 % — ABNORMAL LOW (ref 39.0–52.0)
Hemoglobin: 8.5 g/dL — ABNORMAL LOW (ref 13.0–17.0)
Hemoglobin: 8.5 g/dL — ABNORMAL LOW (ref 13.0–17.0)
O2 Saturation: 95 %
O2 Saturation: 95 %
Patient temperature: 37
Patient temperature: 99.6
Potassium: 3.5 mmol/L (ref 3.5–5.1)
Potassium: 3.4 mmol/L — ABNORMAL LOW (ref 3.5–5.1)
Sodium: 146 mmol/L — ABNORMAL HIGH (ref 135–145)
Sodium: 146 mmol/L — ABNORMAL HIGH (ref 135–145)
TCO2: 39 mmol/L — ABNORMAL HIGH (ref 22–32)
TCO2: 39 mmol/L — ABNORMAL HIGH (ref 22–32)
pCO2 arterial: 47.5 mmHg (ref 32.0–48.0)
pCO2 arterial: 53.6 mmHg — ABNORMAL HIGH (ref 32.0–48.0)
pH, Arterial: 7.506 — ABNORMAL HIGH (ref 7.350–7.450)
pH, Arterial: 7.452 — ABNORMAL HIGH (ref 7.350–7.450)
pO2, Arterial: 71 mmHg — ABNORMAL LOW (ref 83.0–108.0)
pO2, Arterial: 79 mmHg — ABNORMAL LOW (ref 83.0–108.0)

## 2019-04-08 LAB — COMPREHENSIVE METABOLIC PANEL
ALT: 45 U/L — ABNORMAL HIGH (ref 0–44)
AST: 58 U/L — ABNORMAL HIGH (ref 15–41)
Albumin: 1.8 g/dL — ABNORMAL LOW (ref 3.5–5.0)
Alkaline Phosphatase: 58 U/L (ref 38–126)
Anion gap: 7 (ref 5–15)
BUN: 47 mg/dL — ABNORMAL HIGH (ref 6–20)
CO2: 35 mmol/L — ABNORMAL HIGH (ref 22–32)
Calcium: 8.1 mg/dL — ABNORMAL LOW (ref 8.9–10.3)
Chloride: 106 mmol/L (ref 98–111)
Creatinine, Ser: 0.89 mg/dL (ref 0.61–1.24)
GFR calc Af Amer: >60 mL/min (ref >60)
GFR calc non Af Amer: >60 mL/min (ref >60)
Glucose, Bld: 163 mg/dL — ABNORMAL HIGH (ref 70–99)
Potassium: 3.8 mmol/L (ref 3.5–5.1)
Sodium: 148 mmol/L — ABNORMAL HIGH (ref 135–145)
Total Bilirubin: 0.5 mg/dL (ref 0.3–1.2)
Total Protein: 5.4 g/dL — ABNORMAL LOW (ref 6.5–8.1)

## 2019-04-08 LAB — HEPARIN LEVEL (UNFRACTIONATED)
Heparin Unfractionated: 0.28 IU/mL — ABNORMAL LOW (ref 0.30–0.70)
Heparin Unfractionated: 0.27 IU/mL — ABNORMAL LOW (ref 0.30–0.70)
Heparin Unfractionated: 0.2 IU/mL — ABNORMAL LOW (ref 0.30–0.70)

## 2019-04-08 LAB — GLUCOSE, CAPILLARY
Glucose-Capillary: 120 mg/dL — ABNORMAL HIGH (ref 70–99)
Glucose-Capillary: 162 mg/dL — ABNORMAL HIGH (ref 70–99)
Glucose-Capillary: 162 mg/dL — ABNORMAL HIGH (ref 70–99)
Glucose-Capillary: 167 mg/dL — ABNORMAL HIGH (ref 70–99)
Glucose-Capillary: 174 mg/dL — ABNORMAL HIGH (ref 70–99)
Glucose-Capillary: 186 mg/dL — ABNORMAL HIGH (ref 70–99)
Glucose-Capillary: 208 mg/dL — ABNORMAL HIGH (ref 70–99)

## 2019-04-08 MED ORDER — FUROSEMIDE 10 MG/ML IJ SOLN
40.0000 mg | Freq: Two times a day (BID) | INTRAMUSCULAR | Status: DC
  Administered 2019-04-08 (×2): 40 mg via INTRAVENOUS
  Filled 2019-04-08 (×2): qty 4

## 2019-04-08 MED ORDER — PSYLLIUM 95 % PO PACK
2.0000 | PACK | Freq: Two times a day (BID) | ORAL | Status: DC
  Administered 2019-04-08 – 2019-04-12 (×7): 2
  Filled 2019-04-08 (×11): qty 2

## 2019-04-08 MED ORDER — AMIODARONE HCL 200 MG PO TABS
200.0000 mg | ORAL_TABLET | Freq: Every day | ORAL | Status: DC
  Administered 2019-04-09 – 2019-04-27 (×19): 200 mg
  Filled 2019-04-08 (×20): qty 1

## 2019-04-08 MED ORDER — HEPARIN BOLUS VIA INFUSION
2000.0000 [IU] | Freq: Once | INTRAVENOUS | Status: AC
  Administered 2019-04-08: 2000 [IU] via INTRAVENOUS
  Filled 2019-04-08: qty 2000

## 2019-04-08 NOTE — Progress Notes (Signed)
NAME:  Angel Stuart, MRN:  [id number], DOB:  10-28-1960, LOS: 14 ADMISSION DATE:  04/17/2019, CONSULTATION DATE:  10/26 REFERRING MD:  Sharon Seller, CHIEF COMPLAINT:  Dyspnea   Brief History   57 y/o male admitted on 10/26 with ARDS from COVID 19 pneumonia required intubation in the [facility] ED.    Past Medical History  HTN HLD DM2  Significant Hospital Events   10/26 admission to ICU, prone position 10/30 culture positive pseudomonas 11/2 worsening oxygenation 11/5 minimal ventilator settings, some air hunger on exam 11/6 atrial fib with RVR 11/8 hypercapnia with normal oxygenation. Consults:  PCCM  Procedures:  10/26 ETT >  10/26 L subclavian CVL > 11/4  Significant Diagnostic Tests:  11/1 Lower extremity doppler ultrasound> negative for DVT 11/5 TTE > LVEF 60-65%, mild LVH, normal RV 11/6 chest x-ray-persistent bilateral interstitial infiltrates consistent with SARS-CoV-2 pneumonia.  Minimal change since 11/2 (personally reviewed)  Micro Data:  10/29 blood >  10/30 sputum > pseudomonas, gram negative rods, GPC; staph aureus (MSSA)  Antimicrobials/COVID RX:  10/27 Remdesivir > 10/30 10/27 decadron > 11/4  10/30 vanc > 11/2 10/30 zosyn > 11/2  11/2 ceftaz > 11/5  11/7 cefepime > 11/14  Interim history/subjective:   Dyssynchrony has improved overnight.  No longer febrile. Increased respiratory rate and Vt to correct hypercapnia  Objective   Blood pressure (!) 159/63, pulse 97, temperature 99.6 F (37.6 C), temperature source Axillary, resp. rate (!) 30, height 5\' 11"  (1.803 m), weight 119.6 kg, SpO2 100 %.    Vent Mode: PRVC FiO2 (%):  [40 %] 40 % Set Rate:  [24 bmp-35 bmp] 30 bmp Vt Set:  [470 mL-600 mL] 600 mL PEEP:  [8 cmH20-10 cmH20] 8 cmH20 Plateau Pressure:  [22 cmH20-29 cmH20] 25 cmH20   Intake/Output Summary (Last 24 hours) at 04/08/2019 0959 Last data filed at 04/08/2019 0600 Gross per 24 hour  Intake 5226.35 ml  Output 3580 ml  Net  1646.35 ml   Filed Weights   04/02/19 0419 04/03/19 0645 04/08/19 0500  Weight: 105.4 kg 105.2 kg 119.6 kg    Examination:  General:  Intubated and sedated. Obese HENT: ETT/OGT in place with no pressure injury PULM: bronchial breathing as bases posteriorly, ventilator dyssynchrony resolved. Copious white secretions have improved.  Maintaining saturation.  Pplat 30 and PEEPi 12 on RR 35. CV: RRR, no mgr GI: BS+, soft, nontender MSK: normal bulk and tonet Neuro: Opens eyes faintly to voice.  Weakly moves all limbs.  Shivering from cooling blanket.  Resolved Hospital Problem list     Assessment & Plan:   Critically ill due to acute hypoxic and hypercapneic respiratory failure requiring mechanical ventilation.  On lung protective ventilation protocol. Ventilator dyssynchrony has improved. Remains hypercarbic - high dead space ventilation.  - Wean sedation as tolerated - Continue lung protective ventilation. Vt currently at 39m/kg and Pplat acceptable but driving pressure high. Decrease respiratory rate as gas trapping. If tolerates decrease Vt to lower Pplat and decrease driving pressure  -Follow CO2, maintain MV > 13L/min - Continue frequent suctioning  - Start diuresis, as should help  ARDS due to COVID 19 pneumonia Secondary bacterial pneumonia due to P.aeruginosa and MSSA. Completed course of treatment for COVID-19 - Continue antibacterial coverage for both organisms given copious purulent secretions.  Complete 7-day course. - Guaifenasin as mucolytic  Was critically ill due to hypotension requiring titration of phenylephrine, likely sedation related Now off vasopressors  Hypernatremia Secondary to insensible losses from fever - continue  free water repletion.  Best practice:  Diet: continue tube feeding Pain/Anxiety/Delirium protocol (if indicated): dilaudid and dexmedetomidine. Lorazepam prn. VAP protocol (if indicated): yes DVT prophylaxis: lovenox 0.5mg  sub q bid  GI prophylaxis: Pantoprazole for stress ulcer prophylaxis Glucose control: SSI Mobility: bed rest Code Status: Full Family Communication: Dr Doyne Keel updated his wife by phone on 11/8.  He informed her that he was making progress with sedation and ventilator weaning.  He told her that he optimistically thought that he may be able to start weaning trials by 11/11 Disposition: remain in ICU  Labs   CBC: Recent Labs  Lab 04/03/19 0645  04/04/19 0546 04/05/19 0238  04/06/19 0546  04/07/19 1807 04/07/19 1945 04/08/19 0207 04/08/19 0256 04/08/19 0918  WBC 6.6  --  5.3 4.7  --  5.1  --   --   --  5.7  --   --   HGB 13.5   < > 12.7* 11.2*   < > 10.0*   < > 11.2* 8.5* 8.8* 8.5* 8.5*  HCT 45.2   < > 42.5 40.2   < > 35.1*   < > 33.0* 25.0* 30.4* 25.0* 25.0*  MCV 94.0  --  94.2 99.0  --  98.0  --   --   --  94.7  --   --   PLT 371  --  338 275  --  253  --   --   --  230  --   --    < > = values in this interval not displayed.    Basic Metabolic Panel: Recent Labs  Lab 04/04/19 0546 04/05/19 0238  04/06/19 0546  04/07/19 0555  04/07/19 1807 04/07/19 1945 04/08/19 0207 04/08/19 0256 04/08/19 0918  NA 150* 155*   < > 154*   < > 151*   < > 147* 147* 148* 146* 146*  K 3.8 4.4   < > 4.0   < > 4.1   < > 4.4 4.5 3.8 3.5 3.4*  CL 107 109  --  108  --  107  --   --   --  106  --   --   CO2 34* 39*  --  39*  --  36*  --   --   --  35*  --   --   GLUCOSE 396* 362*  --  200*  --  138*  --   --   --  163*  --   --   BUN 92* 71*  --  53*  --  49*  --   --   --  47*  --   --   CREATININE 1.50* 1.11  --  0.92  --  0.86  --   --   --  0.89  --   --   CALCIUM 8.2* 8.2*  --  8.2*  --  8.1*  --   --   --  8.1*  --   --   MG  --  2.9*  --   --   --   --   --   --   --   --   --   --   PHOS  --  3.7  --  1.1*  --  4.2  --   --   --   --   --   --    < > = values in this interval not displayed.   GFR: Estimated Creatinine Clearance: 117.6 mL/min (  by C-G formula based on SCr of 0.89 mg/dL). Recent  Labs  Lab 04/02/19 0400  04/04/19 0546 04/05/19 0238 04/06/19 0546 04/08/19 0207  PROCALCITON 0.50  --   --   --   --   --   WBC 7.1   < > 5.3 4.7 5.1 5.7   < > = values in this interval not displayed.    Liver Function Tests: Recent Labs  Lab 04/03/19 0645 04/04/19 0546 04/05/19 0238 04/06/19 0546 04/08/19 0207  AST 100* 40 22  --  58*  ALT 182* 113* 70*  --  45*  ALKPHOS 49 44 46  --  58  BILITOT 0.9 0.6 0.6  --  0.5  PROT 6.0* 5.7* 5.8*  --  5.4*  ALBUMIN 2.1* 2.0* 2.3* 2.0* 1.8*   No results for input(s): LIPASE, AMYLASE in the last 168 hours. No results for input(s): AMMONIA in the last 168 hours.  ABG    Component Value Date/Time   PHART 7.452 (H) 04/08/2019 0918   PCO2ART 53.6 (H) 04/08/2019 0918   PO2ART 79.0 (L) 04/08/2019 0918   HCO3 37.2 (H) 04/08/2019 0918   TCO2 39 (H) 04/08/2019 0918   ACIDBASEDEF 3.0 (H) 2018-06-27 1442   O2SAT 95.0 04/08/2019 0918     Coagulation Profile: Recent Labs  Lab 04/07/19 2000  INR 1.0    Cardiac Enzymes: No results for input(s): CKTOTAL, CKMB, CKMBINDEX, TROPONINI in the last 168 hours.  HbA1C: Hgb A1c MFr Bld  Date/Time Value Ref Range Status  2018-06-27 01:00 PM 7.5 (H) 4.8 - 5.6 % Final    Comment:    (NOTE) Pre diabetes:          5.7%-6.4% Diabetes:              >6.4% Glycemic control for   <7.0% adults with diabetes     CBG: Recent Labs  Lab 04/07/19 1514 04/07/19 1948 04/08/19 0030 04/08/19 0411 04/08/19 0751  GLUCAP 144* 163* 162* 120* 186*     CRITICAL CARE Performed by: Lynnell Catalanavi Melyssa Signor   Total critical care time: 40 minutes  Critical care time was exclusive of separately billable procedures and treating other patients.  Critical care was necessary to treat or prevent imminent or life-threatening deterioration.  Critical care was time spent personally by me on the following activities: development of treatment plan with patient and/or surrogate as well as nursing, discussions with  consultants, evaluation of patient's response to treatment, examination of patient, obtaining history from patient or surrogate, ordering and performing treatments and interventions, ordering and review of laboratory studies, ordering and review of radiographic studies, pulse oximetry, re-evaluation of patient's condition and participation in multidisciplinary rounds.  Lynnell Catalanavi Jasan Doughtie, MD [facility] ICU Physician [facility]  Pager: [phone] Mobile: [phone] After hours: [phone].

## 2019-04-08 NOTE — Progress Notes (Addendum)
ANTICOAGULATION CONSULT NOTE - Initial Consult  Pharmacy Consult for Heparin Indication: Rule out VTE   Patient Measurements: Height: 5\' 11"  (180.3 cm) Weight: 263 lb 10.7 oz (119.6 kg) IBW/kg (Calculated) : 75.3 Heparin Dosing Weight: 97.4 kg  Vital Signs: Temp: 99.8 F (37.7 C) (11/09 1600) Temp Source: Axillary (11/09 1600) BP: 138/61 (11/09 1800) Pulse Rate: 96 (11/09 1800)  Labs: Recent Labs    04/06/19 0546  04/07/19 0555  04/07/19 2000 04/08/19 0207 04/08/19 0256 04/08/19 0900 04/08/19 0918 04/08/19 1755  HGB 10.0*   < >  --    < >  --  8.8* 8.5*  --  8.5*  --   HCT 35.1*   < >  --    < >  --  30.4* 25.0*  --  25.0*  --   PLT 253  --   --   --   --  230  --   --   --   --   APTT  --   --   --   --  37*  --   --   --   --   --   LABPROT  --   --   --   --  13.2  --   --   --   --   --   INR  --   --   --   --  1.0  --   --   --   --   --   HEPARINUNFRC  --   --   --   --   --  0.28*  --  0.27*  --  0.20*  CREATININE 0.92  --  0.86  --   --  0.89  --   --   --   --    < > = values in this interval not displayed.    Estimated Creatinine Clearance: 117.6 mL/min (by C-G formula based on SCr of 0.89 mg/dL).    Assessment: 57 yo male admitted 02/28/2019 with COVID-19 pneumonia requiring mechanical ventilation.  He has been receiving VTE prophylaxis with lovenox 0.5mg /kg SQ q12h, LE dopplers negative on 11/1.  Pharmacy consulted to begin IV heparin on 04/07/19 for r/o PE/DVT with persistent hypercarbia.  LE dopplers negative on 11/8 per MD documentation but concern for PE remains. Currently on IV heparin at 1700 units/hr with slightly subtherapeutic anti-Xa level. H/H down since yesterday, Plt wnl   HL drops to 0.2 tonight. We will give a small bolus and increase in rate again. Rn said that the IV was out briefly around 11AM.   Goal of Therapy:  Heparin level 0.3-0.7 units/ml Monitor platelets by anticoagulation protocol: Yes     Plan:  Heparin bolus 2000 units  x1  Increase heparin infusion to 1950 units/hr  Daily heparin level and CBC Monitor for signs/symptoms of bleeding  Onnie Boer, PharmD, BCIDP, AAHIVP, CPP Infectious Disease Pharmacist 04/08/2019 7:13 PM

## 2019-04-08 NOTE — Progress Notes (Signed)
Angel Stuart  ZOX:[id number] DOB: 07/20/1960 DOA: 03/29/2019 PCP: Lenox PondsSilva Zapata, Edwin, MD    Brief Narrative:  58 year old with a history of hyperlipidemia and DM 2 who began to develop upper respiratory symptoms on 10/5, and ultimately tested positive for Covid on 10/9.  Records suggest he experienced a gradual progression of symptoms with acute worsening of shortness of breath on 10/25, which prompted him to [facility].  In the [facility] he was noted to have a saturation of 63% on room air.  Chest x-ray noted severe bilateral infiltrates.  He rapidly decompensated in the emergency room.  He failed a trial of BiPAP and had to be intubated.  [facility].    Significant Events: 10/05 symptom onset  10/09 positive COVID test 10/25 [facility] - [facility]  [facility] 11/1 bilateral lower extremity venous duplex negative for DVT  11/5 TTE 11/8 empiric heparin drip initiated  COVID-19 specific Treatment: Decadron 10/26 > 11/4 Remdesivir 10/26 > 10/30 Convalescent plasma 10/27  Subjective: Sedated on ventilator. Less vent dyssynchrony today.   Assessment & Plan:  Covid pneumonia -ARDS -acute hypoxic respiratory failure Ventilator management per PCCM - decadron and remdesivir courses have been completed - transfused convalescent plasma 10/27 AM  Pseudomonas and MSSA on tracheal aspirate -relapsing fevers Has completed 7 days of antibiotic therapy and remains afebrile - PCCM intends to extend antibiotic course given copious airway secretions  Acute atrial fibrillation with RVR Heart rate reasonably well controlled - TSH is normal - continue amiodarone short-term  Hypernatremia With increased enteral free water Na appear to be improving - follow   Recent Labs  Lab 04/07/19 1436 04/07/19 1807 04/07/19 1945 04/08/19 0207 04/08/19 0256  NA 147* 147* 147* 148* 146*     Hypotension - Septic shock Resolved with patient's blood pressure now stable  Acute renal failure Creatinine has normalized with volume resuscitation   DM 2 A1c 7.5 -CBG well controlled  Mild transaminitis F/u in AM   Hypophosphatemia Corrected with supplementation  Obesity - Estimated body mass index is 36.77 kg/m as calculated from the following:   Height as of this encounter: 5\' 11"  (1.803 m).   Weight as of this encounter: 119.6 kg.   DVT prophylaxis: Heparin drip Code Status: FULL CODE Family Communication: Per PCCM Disposition Plan: ICU  Consultants:  none  Antimicrobials:  Vancomycin 10/30 > 11/2 Zosyn 10/30 > 11/2 Elita QuickFortaz 11/2 > 11/5 Cefepime 11/7 >  Objective: Blood pressure (!) 159/63, pulse 97, temperature 99.6 F (37.6 C), temperature source Axillary, resp. rate (!) 30, height 5\' 11"  (1.803 m), weight 119.6 kg, SpO2 100 %.  Intake/Output Summary (Last 24 hours) at 04/08/2019 0911 Last data filed at 04/08/2019 0600 Gross per 24 hour  Intake 5226.35 ml  Output 3580 ml  Net 1646.35 ml   Filed Weights   04/02/19 0419 04/03/19 0645 04/08/19 0500  Weight: 105.4 kg 105.2 kg 119.6 kg    Examination: General: sedated on vent  Lungs: Diffuse fine crackles persist  Cardiovascular: Irregularly irregular - rate ~100 Abdomen: Obese, soft Extremities: trace B LE edema   CBC: Recent Labs  Lab 04/05/19 0238  04/06/19 0546  04/07/19 1945 04/08/19 0207 04/08/19 0256  WBC 4.7  --  5.1  --   --  5.7  --   HGB 11.2*   < > 10.0*   < > 8.5* 8.8* 8.5*  HCT 40.2   < >  35.1*   < > 25.0* 30.4* 25.0*  MCV 99.0  --  98.0  --   --  94.7  --   PLT 275  --  253  --   --  230  --    < > = values in this interval not displayed.   Basic Metabolic Panel: Recent Labs  Lab 04/05/19 0238  04/06/19 0546  04/07/19 0555  04/07/19 1945 04/08/19 0207 04/08/19 0256  NA 155*   < > 154*   < > 151*   < > 147* 148* 146*  K 4.4   < > 4.0   < > 4.1   < > 4.5 3.8 3.5  CL 109   --  108  --  107  --   --  106  --   CO2 39*  --  39*  --  36*  --   --  35*  --   GLUCOSE 362*  --  200*  --  138*  --   --  163*  --   BUN 71*  --  53*  --  49*  --   --  47*  --   CREATININE 1.11  --  0.92  --  0.86  --   --  0.89  --   CALCIUM 8.2*  --  8.2*  --  8.1*  --   --  8.1*  --   MG 2.9*  --   --   --   --   --   --   --   --   PHOS 3.7  --  1.1*  --  4.2  --   --   --   --    < > = values in this interval not displayed.   GFR: Estimated Creatinine Clearance: 117.6 mL/min (by C-G formula based on SCr of 0.89 mg/dL).  Liver Function Tests: Recent Labs  Lab 04/03/19 0645 04/04/19 0546 04/05/19 0238 04/06/19 0546 04/08/19 0207  AST 100* 40 22  --  58*  ALT 182* 113* 70*  --  45*  ALKPHOS 49 44 46  --  58  BILITOT 0.9 0.6 0.6  --  0.5  PROT 6.0* 5.7* 5.8*  --  5.4*  ALBUMIN 2.1* 2.0* 2.3* 2.0* 1.8*    HbA1C: Hgb A1c MFr Bld  Date/Time Value Ref Range Status  April 08, 2019 01:00 PM 7.5 (H) 4.8 - 5.6 % Final    Comment:    (NOTE) Pre diabetes:          5.7%-6.4% Diabetes:              >6.4% Glycemic control for   <7.0% adults with diabetes     CBG: Recent Labs  Lab 04/07/19 1514 04/07/19 1948 04/08/19 0030 04/08/19 0411 04/08/19 0751  GLUCAP 144* 163* 162* 120* 186*    Recent Results (from the past 240 hour(s))  Culture, respiratory (non-expectorated)     Status: None (Preliminary result)   Collection Time: 04/06/19  9:55 AM   Specimen: Tracheal Aspirate; Respiratory  Result Value Ref Range Status   Specimen Description   Final    TRACHEAL ASPIRATE Performed at [facility] [address], [place], [place] [address]    Special Requests NONE  Final   Gram Stain   Final    RARE WBC PRESENT, PREDOMINANTLY PMN FEW GRAM NEGATIVE RODS    Culture   Final    TOO YOUNG TO READ Performed at [facility]  Lab, [address] [place], [place] [address]    Report Status PENDING  Incomplete     Scheduled Meds: . amiodarone  200  mg Oral Daily  . chlorhexidine  15 mL Mouth/Throat BID  . Chlorhexidine Gluconate Cloth  6 each Topical Daily  . clonazePAM  1 mg Per Tube BID  . feeding supplement (PRO-STAT SUGAR FREE 64)  60 mL Per Tube TID  . free water  500 mL Per Tube Q4H  . furosemide  40 mg Intravenous BID  . guaiFENesin  10 mL Per Tube BID  . insulin aspart  0-20 Units Subcutaneous Q4H  . insulin aspart  14 Units Subcutaneous Q4H  . insulin glargine  50 Units Subcutaneous BID  . mouth rinse  15 mL Mouth Rinse 10 times per day  . midodrine  5 mg Per Tube Q8H  . pantoprazole sodium  40 mg Per Tube Daily  . phosphorus  500 mg Per Tube TID  . psyllium  2 packet Per Tube BID   Continuous Infusions: . sodium chloride 10 mL/hr at 04/07/19 1800  . ceFEPime (MAXIPIME) IV 2 g (04/08/19 0618)  . feeding supplement (VITAL 1.5 CAL) 1,000 mL (04/07/19 1040)  . heparin 1,700 Units/hr (04/07/19 1959)  . HYDROmorphone 3.5 mg/hr (04/08/19 0615)  . phenylephrine (NEO-SYNEPHRINE) Adult infusion Stopped (04/07/19 0749)     LOS: 14 days   Lonia Blood, MD [facility] Office  [phone] Pager - Text Page per Amion  If 7PM-7AM, please contact night-coverage per Amion 04/08/2019, 9:11 AM

## 2019-04-08 NOTE — Progress Notes (Signed)
NAME:  Angel Stuart, MRN:  [id number], DOB:  05-03-1961, LOS: 14 ADMISSION DATE:  2019-03-27, CONSULTATION DATE:  10/26 REFERRING MD:  Sharon Seller, CHIEF COMPLAINT:  Dyspnea   Brief History   58 y/o male admitted on 10/26 with ARDS from COVID 19 pneumonia required intubation in the [facility] ED.    Past Medical History  HTN HLD DM2  Significant Hospital Events   10/26 admission to ICU, prone position 10/30 culture positive pseudomonas 11/2 worsening oxygenation 11/5 minimal ventilator settings, some air hunger on exam 11/6 atrial fib with RVR  11/7 weaned off vasopressors. 11/8 hypercapnia with normal oxygenation. Consults:  PCCM  Procedures:  10/26 ETT >  10/26 L subclavian CVL > 11/4  Significant Diagnostic Tests:  11/1 Lower extremity doppler ultrasound> negative for DVT 11/5 TTE > LVEF 60-65%, mild LVH, normal RV 11/6 chest x-ray-persistent bilateral interstitial infiltrates consistent with SARS-CoV-2 pneumonia.  Minimal change since 11/2 (personally reviewed)  Micro Data:  10/29 blood >  10/30 sputum > pseudomonas, gram negative rods, GPC; staph aureus (MSSA)  Antimicrobials/COVID RX:  10/27 Remdesivir > 10/30 10/27 decadron > 11/4  10/30 vanc > 11/2 10/30 zosyn > 11/2  11/2 ceftaz > 11/5  11/7 cefepime > 11/14  Interim history/subjective:   Dyssynchrony has improved overnight.  No longer febrile. Increased respiratory rate and Vt to correct hypercapnia.  Objective   Blood pressure 122/66, pulse (!) 103, temperature 99.6 F (37.6 C), temperature source Axillary, resp. rate (!) 30, height 5\' 11"  (1.803 m), weight 119.6 kg, SpO2 100 %.    Vent Mode: PRVC FiO2 (%):  [40 %] 40 % Set Rate:  [30 bmp-35 bmp] 30 bmp Vt Set:  [470 mL-600 mL] 600 mL PEEP:  [8 cmH20-10 cmH20] 8 cmH20 Plateau Pressure:  [23 cmH20-29 cmH20] 25 cmH20   Intake/Output Summary (Last 24 hours) at 04/08/2019 1134 Last data filed at 04/08/2019 0600 Gross per 24 hour  Intake  4200.39 ml  Output 3580 ml  Net 620.39 ml   Filed Weights   04/02/19 0419 04/03/19 0645 04/08/19 0500  Weight: 105.4 kg 105.2 kg 119.6 kg    Examination:  General:  Intubated and sedated. Obese HENT: ETT/OGT in place with no pressure injury PULM: bronchial breathing as bases posteriorly, ventilator dyssynchrony resolved. Copious white secretions have improved.  Maintaining saturation.  Pplat 30 and PEEPi 12 on RR 35. CV: RRR, no mgr GI: BS+, soft, nontender MSK: normal bulk and tonet Neuro: Opens eyes faintly to voice.  Weakly moves all limbs.  Shivering from cooling blanket.  Resolved Hospital Problem list   Hypotension requiring vasopressors  Assessment & Plan:   Critically ill due to acute hypoxic and hypercapneic respiratory failure requiring mechanical ventilation.  On lung protective ventilation protocol. Ventilator dyssynchrony has improved. Remains hypercarbic - high dead space ventilation.  - Wean sedation as tolerated - Continue lung protective ventilation. Vt currently at 75m/kg and Pplat acceptable but driving pressure high. Decrease respiratory rate as gas trapping. If tolerates decrease Vt to lower Pplat and decrease driving pressure  -Follow CO2, maintain MV > 13L/min - Continue frequent suctioning  - Start diuresis, as should help decrease airway pressures.   ARDS due to COVID 19 pneumonia Secondary bacterial pneumonia due to P.aeruginosa and MSSA. Completed course of treatment for COVID-19 - Continue antibacterial coverage for both organisms given copious purulent secretions.  Complete 7-day course. - Guaifenasin as mucolytic - secretions have improved.  Hypernatremia Secondary to insensible losses from fever - continue free water  repletion.  Best practice:  Diet: continue tube feeding Pain/Anxiety/Delirium protocol (if indicated): dilaudid and dexmedetomidine. Lorazepam prn. VAP protocol (if indicated): yes DVT prophylaxis: lovenox 0.5mg  sub q bid GI  prophylaxis: Pantoprazole for stress ulcer prophylaxis Glucose control: SSI Mobility: bed rest Code Status: Full Family Communication: Dr Doyne Keel updated his wife by phone on 11/8.  He informed her that he was making progress with sedation and ventilator weaning.  He told her that he optimistically thought that he may be able to start weaning trials by 11/11 Disposition: remain in ICU  Labs   CBC: Recent Labs  Lab 04/03/19 0645  04/04/19 0546 04/05/19 0238  04/06/19 0546  04/07/19 1807 04/07/19 1945 04/08/19 0207 04/08/19 0256 04/08/19 0918  WBC 6.6  --  5.3 4.7  --  5.1  --   --   --  5.7  --   --   HGB 13.5   < > 12.7* 11.2*   < > 10.0*   < > 11.2* 8.5* 8.8* 8.5* 8.5*  HCT 45.2   < > 42.5 40.2   < > 35.1*   < > 33.0* 25.0* 30.4* 25.0* 25.0*  MCV 94.0  --  94.2 99.0  --  98.0  --   --   --  94.7  --   --   PLT 371  --  338 275  --  253  --   --   --  230  --   --    < > = values in this interval not displayed.    Basic Metabolic Panel: Recent Labs  Lab 04/04/19 0546 04/05/19 0238  04/06/19 0546  04/07/19 0555  04/07/19 1807 04/07/19 1945 04/08/19 0207 04/08/19 0256 04/08/19 0918  NA 150* 155*   < > 154*   < > 151*   < > 147* 147* 148* 146* 146*  K 3.8 4.4   < > 4.0   < > 4.1   < > 4.4 4.5 3.8 3.5 3.4*  CL 107 109  --  108  --  107  --   --   --  106  --   --   CO2 34* 39*  --  39*  --  36*  --   --   --  35*  --   --   GLUCOSE 396* 362*  --  200*  --  138*  --   --   --  163*  --   --   BUN 92* 71*  --  53*  --  49*  --   --   --  47*  --   --   CREATININE 1.50* 1.11  --  0.92  --  0.86  --   --   --  0.89  --   --   CALCIUM 8.2* 8.2*  --  8.2*  --  8.1*  --   --   --  8.1*  --   --   MG  --  2.9*  --   --   --   --   --   --   --   --   --   --   PHOS  --  3.7  --  1.1*  --  4.2  --   --   --   --   --   --    < > = values in this interval not displayed.   GFR: Estimated Creatinine Clearance: 117.6 mL/min (by C-G  formula based on SCr of 0.89 mg/dL). Recent Labs   Lab 04/02/19 0400  04/04/19 0546 04/05/19 0238 04/06/19 0546 04/08/19 0207  PROCALCITON 0.50  --   --   --   --   --   WBC 7.1   < > 5.3 4.7 5.1 5.7   < > = values in this interval not displayed.    Liver Function Tests: Recent Labs  Lab 04/03/19 0645 04/04/19 0546 04/05/19 0238 04/06/19 0546 04/08/19 0207  AST 100* 40 22  --  58*  ALT 182* 113* 70*  --  45*  ALKPHOS 49 44 46  --  58  BILITOT 0.9 0.6 0.6  --  0.5  PROT 6.0* 5.7* 5.8*  --  5.4*  ALBUMIN 2.1* 2.0* 2.3* 2.0* 1.8*   No results for input(s): LIPASE, AMYLASE in the last 168 hours. No results for input(s): AMMONIA in the last 168 hours.  ABG    Component Value Date/Time   PHART 7.452 (H) 04/08/2019 0918   PCO2ART 53.6 (H) 04/08/2019 0918   PO2ART 79.0 (L) 04/08/2019 0918   HCO3 37.2 (H) 04/08/2019 0918   TCO2 39 (H) 04/08/2019 0918   ACIDBASEDEF 3.0 (H) 08-08-2018 1442   O2SAT 95.0 04/08/2019 0918     Coagulation Profile: Recent Labs  Lab 04/07/19 2000  INR 1.0    Cardiac Enzymes: No results for input(s): CKTOTAL, CKMB, CKMBINDEX, TROPONINI in the last 168 hours.  HbA1C: Hgb A1c MFr Bld  Date/Time Value Ref Range Status  08-08-2018 01:00 PM 7.5 (H) 4.8 - 5.6 % Final    Comment:    (NOTE) Pre diabetes:          5.7%-6.4% Diabetes:              >6.4% Glycemic control for   <7.0% adults with diabetes     CBG: Recent Labs  Lab 04/07/19 1514 04/07/19 1948 04/08/19 0030 04/08/19 0411 04/08/19 0751  GLUCAP 144* 163* 162* 120* 186*     CRITICAL CARE Performed by: Lynnell Catalanavi Joslin Doell   Total critical care time: 40 minutes  Critical care time was exclusive of separately billable procedures and treating other patients.  Critical care was necessary to treat or prevent imminent or life-threatening deterioration.  Critical care was time spent personally by me on the following activities: development of treatment plan with patient and/or surrogate as well as nursing, discussions with  consultants, evaluation of patient's response to treatment, examination of patient, obtaining history from patient or surrogate, ordering and performing treatments and interventions, ordering and review of laboratory studies, ordering and review of radiographic studies, pulse oximetry, re-evaluation of patient's condition and participation in multidisciplinary rounds.  Lynnell Catalanavi Avelynn Sellin, MD [facility] ICU Physician [facility]  Pager: [phone] Mobile: [phone] After hours: [phone].

## 2019-04-08 NOTE — Progress Notes (Signed)
ANTICOAGULATION CONSULT NOTE - Initial Consult  Pharmacy Consult for Heparin Indication: Rule out VTE   Patient Measurements: Height: 5\' 11"  (180.3 cm) Weight: 231 lb 14.8 oz (105.2 kg) IBW/kg (Calculated) : 75.3 Heparin Dosing Weight: 97.4 kg  Vital Signs: Temp: 99.1 F (37.3 C) (11/09 0200) Temp Source: Esophageal (11/09 0000) BP: 175/80 (11/09 0257) Pulse Rate: 80 (11/09 0257)  Labs: Recent Labs    04/06/19 0546  04/07/19 0555  04/07/19 1945 04/07/19 2000 04/08/19 0207 04/08/19 0256  HGB 10.0*   < >  --    < > 8.5*  --  8.8* 8.5*  HCT 35.1*   < >  --    < > 25.0*  --  30.4* 25.0*  PLT 253  --   --   --   --   --  230  --   APTT  --   --   --   --   --  37*  --   --   LABPROT  --   --   --   --   --  13.2  --   --   INR  --   --   --   --   --  1.0  --   --   HEPARINUNFRC  --   --   --   --   --   --  0.28*  --   CREATININE 0.92  --  0.86  --   --   --  0.89  --    < > = values in this interval not displayed.    Estimated Creatinine Clearance: 110.4 mL/min (by C-G formula based on SCr of 0.89 mg/dL).    Assessment: 58 yo male admitted 04-15-2019 with COVID-19 pneumonia requiring mechanical ventilation.  He has been receiving VTE prophylaxis with lovenox 0.5mg /kg SQ q12h, LE dopplers negative on 11/1.  Pharmacy consulted to begin IV heparin on 04/07/19 for r/o PE/DVT with persistent hypercarbia.  Goal of Therapy:  Heparin level 0.3-0.7 units/ml Monitor platelets by anticoagulation protocol: Yes   04/08/2019 0400 UPDATE HL= 0.28 IU/mL at 0207 (~6-hr level) Likely heparin isn't at steady state yet, as HL drawn about 2 hours early Hb has dropped by 1.7 since yesterday and Hct 25; platelets remain stable    Plan:    Continue heparin infusion at 1700 units/hr (~18 units/kg/hr)  Confirmatory HL in 6-8 hrs  Daily heparin level and CBC  Monitor for signs/symptoms of bleeding  Despina Pole, Pharm. D. Clinical Pharmacist 04/08/2019 4:23 AM

## 2019-04-08 NOTE — Progress Notes (Signed)
Family updated by Agricultural consultant for patient's RN.

## 2019-04-08 NOTE — Progress Notes (Signed)
Called and updated patient's wife, all questions/ concerns answered at this time.

## 2019-04-08 NOTE — Progress Notes (Signed)
ANTICOAGULATION CONSULT NOTE - Initial Consult  Pharmacy Consult for Heparin Indication: Rule out VTE   Patient Measurements: Height: 5\' 11"  (180.3 cm) Weight: 263 lb 10.7 oz (119.6 kg) IBW/kg (Calculated) : 75.3 Heparin Dosing Weight: 97.4 kg  Vital Signs: Temp: 99.6 F (37.6 C) (11/09 0800) Temp Source: Axillary (11/09 0800) BP: 159/63 (11/09 0842) Pulse Rate: 97 (11/09 0842)  Labs: Recent Labs    04/06/19 0546  04/07/19 0555  04/07/19 2000 04/08/19 0207 04/08/19 0256 04/08/19 0900 04/08/19 0918  HGB 10.0*   < >  --    < >  --  8.8* 8.5*  --  8.5*  HCT 35.1*   < >  --    < >  --  30.4* 25.0*  --  25.0*  PLT 253  --   --   --   --  230  --   --   --   APTT  --   --   --   --  37*  --   --   --   --   LABPROT  --   --   --   --  13.2  --   --   --   --   INR  --   --   --   --  1.0  --   --   --   --   HEPARINUNFRC  --   --   --   --   --  0.28*  --  0.27*  --   CREATININE 0.92  --  0.86  --   --  0.89  --   --   --    < > = values in this interval not displayed.    Estimated Creatinine Clearance: 117.6 mL/min (by C-G formula based on SCr of 0.89 mg/dL).    Assessment: 58 yo male admitted 21-Apr-2019 with COVID-19 pneumonia requiring mechanical ventilation.  He has been receiving VTE prophylaxis with lovenox 0.5mg /kg SQ q12h, LE dopplers negative on 11/1.  Pharmacy consulted to begin IV heparin on 04/07/19 for r/o PE/DVT with persistent hypercarbia.  LE dopplers negative on 11/8 per MD documentation but concern for PE remains. Currently on IV heparin at 1700 units/hr with slightly subtherapeutic anti-Xa level. H/H down since yesterday, Plt wnl   Goal of Therapy:  Heparin level 0.3-0.7 units/ml Monitor platelets by anticoagulation protocol: Yes     Plan:   Increase heparin infusion to 1750 units/hr (~18 units/kg/hr)  Confirmatory HL in 6 hrs  Daily heparin level and CBC  Monitor for signs/symptoms of bleeding  Albertina Parr, PharmD., BCPS Clinical  Pharmacist Clinical phone for 04/08/19 until 5pm: [phone]

## 2019-04-09 LAB — CBC
HCT: 30 % — ABNORMAL LOW (ref 39.0–52.0)
Hemoglobin: 9 g/dL — ABNORMAL LOW (ref 13.0–17.0)
MCH: 27.7 pg (ref 26.0–34.0)
MCHC: 30 g/dL (ref 30.0–36.0)
MCV: 92.3 fL (ref 80.0–100.0)
Platelets: 230 10*3/uL (ref 150–400)
RBC: 3.25 MIL/uL — ABNORMAL LOW (ref 4.22–5.81)
RDW: 14.4 % (ref 11.5–15.5)
WBC: 6.4 10*3/uL (ref 4.0–10.5)
nRBC: 0.3 % — ABNORMAL HIGH (ref 0.0–0.2)

## 2019-04-09 LAB — BASIC METABOLIC PANEL
Anion gap: 9 (ref 5–15)
BUN: 45 mg/dL — ABNORMAL HIGH (ref 6–20)
CO2: 35 mmol/L — ABNORMAL HIGH (ref 22–32)
Calcium: 8.4 mg/dL — ABNORMAL LOW (ref 8.9–10.3)
Chloride: 102 mmol/L (ref 98–111)
Creatinine, Ser: 0.8 mg/dL (ref 0.61–1.24)
GFR calc Af Amer: >60 mL/min (ref >60)
GFR calc non Af Amer: >60 mL/min (ref >60)
Glucose, Bld: 95 mg/dL (ref 70–99)
Potassium: 3.5 mmol/L (ref 3.5–5.1)
Sodium: 146 mmol/L — ABNORMAL HIGH (ref 135–145)

## 2019-04-09 LAB — HEPARIN LEVEL (UNFRACTIONATED)
Heparin Unfractionated: 0.42 IU/mL (ref 0.30–0.70)
Heparin Unfractionated: 0.26 IU/mL — ABNORMAL LOW (ref 0.30–0.70)
Heparin Unfractionated: 0.31 IU/mL (ref 0.30–0.70)

## 2019-04-09 LAB — POCT I-STAT 7, (LYTES, BLD GAS, ICA,H+H)
Acid-Base Excess: 11 mmol/L — ABNORMAL HIGH (ref 0.0–2.0)
Bicarbonate: 37.2 mmol/L — ABNORMAL HIGH (ref 20.0–28.0)
Calcium, Ion: 1.19 mmol/L (ref 1.15–1.40)
HCT: 27 % — ABNORMAL LOW (ref 39.0–52.0)
Hemoglobin: 9.2 g/dL — ABNORMAL LOW (ref 13.0–17.0)
O2 Saturation: 97 %
Patient temperature: 99.7
Potassium: 3.4 mmol/L — ABNORMAL LOW (ref 3.5–5.1)
Sodium: 145 mmol/L (ref 135–145)
TCO2: 39 mmol/L — ABNORMAL HIGH (ref 22–32)
pCO2 arterial: 58.9 mmHg — ABNORMAL HIGH (ref 32.0–48.0)
pH, Arterial: 7.411 (ref 7.350–7.450)
pO2, Arterial: 96 mmHg (ref 83.0–108.0)

## 2019-04-09 LAB — HEPATIC FUNCTION PANEL
ALT: 40 U/L (ref 0–44)
AST: 49 U/L — ABNORMAL HIGH (ref 15–41)
Albumin: 1.9 g/dL — ABNORMAL LOW (ref 3.5–5.0)
Alkaline Phosphatase: 62 U/L (ref 38–126)
Bilirubin, Direct: 0.1 mg/dL (ref 0.0–0.2)
Indirect Bilirubin: 0.3 mg/dL (ref 0.3–0.9)
Total Bilirubin: 0.4 mg/dL (ref 0.3–1.2)
Total Protein: 5.9 g/dL — ABNORMAL LOW (ref 6.5–8.1)

## 2019-04-09 LAB — CULTURE, RESPIRATORY W GRAM STAIN

## 2019-04-09 LAB — GLUCOSE, CAPILLARY
Glucose-Capillary: 91 mg/dL (ref 70–99)
Glucose-Capillary: 129 mg/dL — ABNORMAL HIGH (ref 70–99)
Glucose-Capillary: 218 mg/dL — ABNORMAL HIGH (ref 70–99)
Glucose-Capillary: 168 mg/dL — ABNORMAL HIGH (ref 70–99)
Glucose-Capillary: 163 mg/dL — ABNORMAL HIGH (ref 70–99)

## 2019-04-09 MED ORDER — PRO-STAT SUGAR FREE PO LIQD
30.0000 mL | Freq: Four times a day (QID) | ORAL | Status: DC
  Administered 2019-04-09 – 2019-04-27 (×72): 30 mL
  Filled 2019-04-09 (×68): qty 30

## 2019-04-09 MED ORDER — FUROSEMIDE 10 MG/ML IJ SOLN
8.0000 mg/h | INTRAVENOUS | Status: DC
  Administered 2019-04-09 – 2019-04-12 (×3): 8 mg/h via INTRAVENOUS
  Filled 2019-04-09: qty 21
  Filled 2019-04-09 (×3): qty 25

## 2019-04-09 MED ORDER — VITAL 1.5 CAL PO LIQD
1000.0000 mL | ORAL | Status: DC
  Administered 2019-04-09 – 2019-04-30 (×20): 1000 mL

## 2019-04-09 MED ORDER — POTASSIUM CHLORIDE 20 MEQ/15ML (10%) PO SOLN
40.0000 meq | Freq: Every day | ORAL | Status: DC
  Administered 2019-04-09 – 2019-04-13 (×5): 40 meq
  Filled 2019-04-09 (×5): qty 30

## 2019-04-09 MED ORDER — FUROSEMIDE 10 MG/ML IJ SOLN
80.0000 mg | Freq: Two times a day (BID) | INTRAMUSCULAR | Status: DC
  Administered 2019-04-09 (×2): 80 mg via INTRAVENOUS
  Filled 2019-04-09 (×2): qty 8

## 2019-04-09 MED ORDER — FUROSEMIDE 10 MG/ML IJ SOLN
100.0000 mg | Freq: Once | INTRAVENOUS | Status: AC
  Administered 2019-04-09: 100 mg via INTRAVENOUS
  Filled 2019-04-09: qty 10

## 2019-04-09 NOTE — Progress Notes (Signed)
Nutrition Follow-up RD working remotely.  DOCUMENTATION CODES:   Obesity unspecified  INTERVENTION:    Vital 1.5 at 65 ml/h (1560 ml per day)   Pro-stat 30 ml QID   Provides 2740 kcal, 165 gm protein, 1192 ml free water daily  NUTRITION DIAGNOSIS:   Increased nutrient needs related to acute illness as evidenced by estimated needs.  Ongoing   GOAL:   Provide needs based on ASPEN/SCCM guidelines   Met  MONITOR:   Vent status, Labs, I & O's, TF tolerance  ASSESSMENT:   58 yo male admitted with progressive SOB after testing positive for COVID-19 on 10/9 (symptoms started 10/5). PMH includes DM, HLD.   10/28 Postpyloric Cortrak tube placed  Currently receiving Vital 1.5 at 80 ml/h with Pro-stat 60 ml TID. Free water flushes 500 ml every 4 hours.   Patient remains intubated on ventilator support. MV: 16.2 L/min Temp (24hrs), Avg:99.2 F (37.3 C), Min:98.7 F (37.1 C), Max:99.9 F (37.7 C)   Labs reviewed. Sodium 146 CBG's: 91-129-218  Medications reviewed and include lasix, novolog, lantus, KCl, psyllium.  I/O +12.5 L since admission Weight up 20 lbs since admission  NUTRITION - FOCUSED PHYSICAL EXAM:  deferred  Diet Order:   Diet Order            Diet NPO time specified  Diet effective now              EDUCATION NEEDS:   Not appropriate for education at this time  Skin:  Skin Assessment: Reviewed RN Assessment(non pressure wound to R abdomen)  Last BM:  11/10 type 7  Height:   Ht Readings from Last 1 Encounters:  03/24/2019 5' 11"  (1.803 m)    Weight:   Wt Readings from Last 1 Encounters:  04/09/19 120.3 kg   Admit weight 111.4 kg  Ideal Body Weight:  78.2 kg  BMI:  Body mass index is 36.99 kg/m.  Estimated Nutritional Needs:   Kcal:  2700-2800  Protein:  >/= 156 gm  Fluid:  >/= 1.8 L    Molli Barrows, RD, LDN, [place] Pager [phone] After Hours Pager [phone]

## 2019-04-09 NOTE — Progress Notes (Signed)
Pharmacy Antibiotic Note  Kaylem Gidney is a 58 y.o. male admitted on 04/21/2019 with COVID-19 pneumonia.  Recently completed 7 days of Ceftazidime for pseudomonas and MSSA pneumonia.  Pharmacy was reconsulted to restart Cefepime dosing. Currently on D4 of repeat Cefepime course. Patient's WBC remains wnl and he is afebrile. SCr wnl. Of note, patient grew repeat pan-S pseudomonas in trach aspirate which is likely colonizer at this point given no systemic s/s of infection   Plan: Continue Cefepime 2g IV q8h. CCM planning to complete 7 day course  Monitor clinical course and renal fx    Height: 5\' 11"  (180.3 cm) Weight: 265 lb 3.4 oz (120.3 kg) IBW/kg (Calculated) : 75.3  Temp (24hrs), Avg:99.9 F (37.7 C), Min:98.7 F (37.1 C), Max:101.5 F (38.6 C)  Recent Labs  Lab 04/04/19 0546 04/05/19 0238 04/06/19 0546 04/07/19 0555 04/08/19 0207 04/09/19 0410  WBC 5.3 4.7 5.1  --  5.7 6.4  CREATININE 1.50* 1.11 0.92 0.86 0.89 0.80    Estimated Creatinine Clearance: 131.2 mL/min (by C-G formula based on SCr of 0.8 mg/dL).    Allergies  Allergen Reactions  . Clindamycin Hcl Other (See Comments)    Other reaction(s): Other (See Comments) Other reaction(s): ANAPHYLAXIS Other reaction(s): ANAPHYLAXIS Other reaction(s): Other (See Comments) Other reaction(s): ANAPHYLAXIS Other reaction(s): ANAPHYLAXIS Other reaction(s): ANAPHYLAXIS   . 5-Alpha Reductase Inhibitors   . Semaglutide Nausea And Vomiting  . Hydromorphone Nausea And Vomiting    Per patient Per patient Per patient     Antimicrobials this admission: 10/27 remdesivir >> 10/30 10/30 Vanc >> 11/1 10/30 Zosyn >> 11/2 11/2 Ceftazidime >> 11/5 11/7 Cefepime >>   Microbiology results: 10/26 MRSA PCR: negative 10/29 BCx negF  10/30 exp sputum: mod Pseudomonas aeruginosa  (pan-sens), MSSA 11/7 TA: mod pseudomonas   Thank you for allowing pharmacy to be a part of this patient's care.  Albertina Parr,  PharmD., BCPS Clinical Pharmacist Clinical phone for 04/09/19 until 5pm: [phone]

## 2019-04-09 NOTE — Progress Notes (Signed)
Angel Stuart  ZOX:[id number] DOB: 03/17/1961 DOA: Dec 31, 2018 PCP: Lenox PondsSilva Zapata, Edwin, MD    Brief Narrative:  58 year old with a history of hyperlipidemia and DM 2 who began to develop upper respiratory symptoms on 10/5, and ultimately tested positive for Covid on 10/9.  Records suggest he experienced a gradual progression of symptoms with acute worsening of shortness of breath on 10/25, which prompted him to present [facility] the [facility].  In [facility] he was noted to have a saturation of 63% on room air.  Chest x-ray noted severe bilateral infiltrates.  He rapidly decompensated in the emergency room.  He failed a trial of BiPAP and had to be intubated.  [facility].    Significant Events: 10/05 symptom onset  10/09 positive COVID test 10/25 [facility] - intubated  10/26 [facility] 11/1 bilateral lower extremity venous duplex negative for DVT  11/5 TTE 11/8 empiric heparin drip initiated  COVID-19 specific Treatment: Decadron 10/26 > 11/4 Remdesivir 10/26 > 10/30 Convalescent plasma 10/27  Subjective: Sedated on vent at time of exam.  Assessment & Plan:  Covid pneumonia -ARDS -acute hypoxic respiratory failure Ventilator management per PCCM - decadron and remdesivir courses have been completed - transfused convalescent plasma 10/27 AM  Pseudomonas and MSSA on tracheal aspirate -relapsing fevers Has completed 7 days of antibiotic therapy and remains afebrile - PCCM intends to extend antibiotic course given copious airway secretions  Acute atrial fibrillation with RVR Back in NSR this morning - heart rate reasonably well controlled - TSH is normal - continue enteral amiodarone short-term  Hypernatremia Cont enteral free water   Recent Labs  Lab 04/07/19 1945 04/08/19 0207 04/08/19 0256 04/08/19 0918 04/09/19 0410  NA 147* 148* 146* 146* 146*    Hypotension - Septic shock Resolved  with patient's blood pressure now stable  Acute renal failure Creatinine has normalized with volume resuscitation   DM 2 A1c 7.5 - CBG well controlled  Mild transaminitis LFTs essentially normal at this time  Hypophosphatemia Corrected with supplementation  Obesity - Estimated body mass index is 36.99 kg/m as calculated from the following:   Height as of this encounter: 5\' 11"  (1.803 m).   Weight as of this encounter: 120.3 kg.   DVT prophylaxis: Heparin drip Code Status: FULL CODE Family Communication: Per PCCM Disposition Plan: ICU  Consultants:  none  Antimicrobials:  Vancomycin 10/30 > 11/2 Zosyn 10/30 > 11/2 Elita QuickFortaz 11/2 > 11/5 Cefepime 11/7 >  Objective: Blood pressure (!) 153/66, pulse 83, temperature 99.1 F (37.3 C), temperature source Oral, resp. rate (!) 30, height 5\' 11"  (1.803 m), weight 120.3 kg, SpO2 98 %.  Intake/Output Summary (Last 24 hours) at 04/09/2019 0915 Last data filed at 04/09/2019 0400 Gross per 24 hour  Intake 4632.53 ml  Output 2935 ml  Net 1697.53 ml   Filed Weights   04/03/19 0645 04/08/19 0500 04/09/19 0500  Weight: 105.2 kg 119.6 kg 120.3 kg    Examination: General: sedated on vent  Lungs: Diffuse fine crackles B Cardiovascular: RRR Extremities: trace B LE edema   CBC: Recent Labs  Lab 04/06/19 0546  04/08/19 0207 04/08/19 0256 04/08/19 0918 04/09/19 0410  WBC 5.1  --  5.7  --   --  6.4  HGB 10.0*   < > 8.8* 8.5* 8.5* 9.0*  HCT 35.1*   < > 30.4* 25.0* 25.0* 30.0*  MCV 98.0  --  94.7  --   --  92.3  PLT 253  --  230  --   --  230   < > = values in this interval not displayed.   Basic Metabolic Panel: Recent Labs  Lab 04/05/19 0238  04/06/19 0546  04/07/19 0555  04/08/19 0207 04/08/19 0256 04/08/19 0918 04/09/19 0410  NA 155*   < > 154*   < > 151*   < > 148* 146* 146* 146*  K 4.4   < > 4.0   < > 4.1   < > 3.8 3.5 3.4* 3.5  CL 109  --  108  --  107  --  106  --   --  102  CO2 39*  --  39*  --  36*  --   35*  --   --  35*  GLUCOSE 362*  --  200*  --  138*  --  163*  --   --  95  BUN 71*  --  53*  --  49*  --  47*  --   --  45*  CREATININE 1.11  --  0.92  --  0.86  --  0.89  --   --  0.80  CALCIUM 8.2*  --  8.2*  --  8.1*  --  8.1*  --   --  8.4*  MG 2.9*  --   --   --   --   --   --   --   --   --   PHOS 3.7  --  1.1*  --  4.2  --   --   --   --   --    < > = values in this interval not displayed.   GFR: Estimated Creatinine Clearance: 131.2 mL/min (by C-G formula based on SCr of 0.8 mg/dL).  Liver Function Tests: Recent Labs  Lab 04/04/19 0546 04/05/19 0238 04/06/19 0546 04/08/19 0207 04/09/19 0410  AST 40 22  --  58* 49*  ALT 113* 70*  --  45* 40  ALKPHOS 44 46  --  58 62  BILITOT 0.6 0.6  --  0.5 0.4  PROT 5.7* 5.8*  --  5.4* 5.9*  ALBUMIN 2.0* 2.3* 2.0* 1.8* 1.9*    HbA1C: Hgb A1c MFr Bld  Date/Time Value Ref Range Status  29-Mar-2019 01:00 PM 7.5 (H) 4.8 - 5.6 % Final    Comment:    (NOTE) Pre diabetes:          5.7%-6.4% Diabetes:              >6.4% Glycemic control for   <7.0% adults with diabetes     CBG: Recent Labs  Lab 04/08/19 0751 04/08/19 1141 04/08/19 1623 04/08/19 2006 04/08/19 2348  GLUCAP 186* 167* 162* 174* 208*    Recent Results (from the past 240 hour(s))  Culture, respiratory (non-expectorated)     Status: None   Collection Time: 04/06/19  9:55 AM   Specimen: Tracheal Aspirate; Respiratory  Result Value Ref Range Status   Specimen Description   Final    TRACHEAL ASPIRATE Performed at [facility] [address], [place], [place] [address]    Special Requests NONE  Final   Gram Stain   Final    RARE WBC PRESENT, PREDOMINANTLY PMN FEW GRAM NEGATIVE RODS Performed at [facility], [place] [address], [place], [place] [address]    Culture MODERATE PSEUDOMONAS AERUGINOSA  Final   Report Status 04/09/2019 FINAL  Final  Organism ID, Bacteria PSEUDOMONAS AERUGINOSA  Final      Susceptibility   Pseudomonas  aeruginosa - MIC*    CEFTAZIDIME 4 SENSITIVE Sensitive     CIPROFLOXACIN <=0.25 SENSITIVE Sensitive     GENTAMICIN 2 SENSITIVE Sensitive     IMIPENEM 2 SENSITIVE Sensitive     PIP/TAZO 8 SENSITIVE Sensitive     CEFEPIME 4 SENSITIVE Sensitive     * MODERATE PSEUDOMONAS AERUGINOSA     Scheduled Meds: . amiodarone  200 mg Per Tube Daily  . chlorhexidine  15 mL Mouth/Throat BID  . Chlorhexidine Gluconate Cloth  6 each Topical Daily  . clonazePAM  1 mg Per Tube BID  . feeding supplement (PRO-STAT SUGAR FREE 64)  60 mL Per Tube TID  . free water  500 mL Per Tube Q4H  . furosemide  80 mg Intravenous BID  . guaiFENesin  10 mL Per Tube BID  . insulin aspart  0-20 Units Subcutaneous Q4H  . insulin aspart  14 Units Subcutaneous Q4H  . insulin glargine  50 Units Subcutaneous BID  . mouth rinse  15 mL Mouth Rinse 10 times per day  . midodrine  5 mg Per Tube Q8H  . pantoprazole sodium  40 mg Per Tube Daily  . psyllium  2 packet Per Tube BID   Continuous Infusions: . sodium chloride 10 mL/hr at 04/08/19 1900  . ceFEPime (MAXIPIME) IV 2 g (04/09/19 0610)  . feeding supplement (VITAL 1.5 CAL) 1,000 mL (04/08/19 1140)  . heparin 1,950 Units/hr (04/08/19 2246)  . HYDROmorphone 4 mg/hr (04/09/19 0856)  . phenylephrine (NEO-SYNEPHRINE) Adult infusion Stopped (04/07/19 0749)     LOS: 15 days   Lonia Blood, MD [facility] Office  [phone] Pager - Text Page per Amion  If 7PM-7AM, please contact night-coverage per Amion 04/09/2019, 9:15 AM

## 2019-04-09 NOTE — Progress Notes (Addendum)
ANTICOAGULATION CONSULT NOTE - Initial Consult  Pharmacy Consult for Heparin Indication: Rule out VTE   Patient Measurements: Height: 5\' 11"  (180.3 cm) Weight: 263 lb 10.7 oz (119.6 kg) IBW/kg (Calculated) : 75.3 Heparin Dosing Weight: 97.4 kg  Vital Signs: Temp: 99.1 F (37.3 C) (11/10 0400) Temp Source: Oral (11/10 0400) BP: 163/75 (11/10 0500) Pulse Rate: 79 (11/10 0500)  Labs: Recent Labs    04/07/19 0555  04/07/19 2000  04/08/19 0207 04/08/19 0256 04/08/19 0900 04/08/19 0918 04/08/19 1755 04/09/19 0410  HGB  --    < >  --   --  8.8* 8.5*  --  8.5*  --  9.0*  HCT  --    < >  --   --  30.4* 25.0*  --  25.0*  --  30.0*  PLT  --   --   --   --  230  --   --   --   --  230  APTT  --   --  37*  --   --   --   --   --   --   --   LABPROT  --   --  13.2  --   --   --   --   --   --   --   INR  --   --  1.0  --   --   --   --   --   --   --   HEPARINUNFRC  --   --   --    < > 0.28*  --  0.27*  --  0.20* 0.42  CREATININE 0.86  --   --   --  0.89  --   --   --   --  0.80   < > = values in this interval not displayed.    Estimated Creatinine Clearance: 130.8 mL/min (by C-G formula based on SCr of 0.8 mg/dL).    Assessment: 58 yo male admitted 03/10/2019 with COVID-19 pneumonia requiring mechanical ventilation.  He has been receiving VTE prophylaxis with lovenox 0.5mg /kg SQ q12h, LE dopplers negative on 11/1.  Pharmacy consulted to begin IV heparin on 04/07/19 for r/o PE/DVT with persistent hypercarbia.  LE dopplers negative on 11/8 per MD documentation but concern for PE remains. Currently on IV heparin at 1700 units/hr with slightly subtherapeutic anti-Xa level. H/H down since yesterday, Plt wnl   04-09-19 0600 UPDATE: Heparin level: 0.42 IU/mL at 0410, within desired therapeutic goal range No signs of bleeding noted, despite lower Hb of 9   Goal of Therapy:  Heparin level 0.3-0.7 units/ml Monitor platelets by anticoagulation protocol: Yes    Plan:   Continue   heparin infusion at 1950 units/hr  Confirmatory heparin level at 1200 today Daily heparin level and CBC Monitor for signs/symptoms of bleeding  Despina Pole, Pharm. D. Clinical Pharmacist 04/09/2019 5:57 AM

## 2019-04-09 NOTE — Progress Notes (Signed)
Spoke with patient's wife, [name] and provided a full update/answered all questions.

## 2019-04-09 NOTE — Progress Notes (Signed)
NAME:  Angel Stuart, MRN:  [id number], DOB:  02/14/1961, LOS: 95 ADMISSION DATE:  03/29/2019, CONSULTATION DATE:  10/26 REFERRING MD:  Thereasa Solo, CHIEF COMPLAINT:  Dyspnea   Brief History   58 y/o male admitted on 10/26 with ARDS from [place] 19 pneumonia required intubation in the [facility] ED.    Past Medical History  HTN HLD DM2  Significant Hospital Events   10/26 admission to ICU, prone position 10/30 culture positive pseudomonas 11/2 worsening oxygenation 11/5 minimal ventilator settings, some air hunger on exam 11/6 atrial fib with RVR 11/8 hypercapnia with normal oxygenation. Consults:  PCCM  Procedures:  10/26 ETT >  10/26 L subclavian CVL > 11/4  Significant Diagnostic Tests:  11/1 Lower extremity doppler ultrasound> negative for DVT 11/5 TTE > LVEF 60-65%, mild LVH, normal RV 11/6 chest x-ray-persistent bilateral interstitial infiltrates consistent with SARS-CoV-2 pneumonia.  Minimal change since 11/2 (personally reviewed)  Micro Data:  10/29 blood >  10/30 sputum > pseudomonas, gram negative rods, GPC; staph aureus (MSSA)  Antimicrobials/COVID RX:  10/27 Remdesivir > 10/30 10/27 decadron > 11/4  10/30 vanc > 11/2 10/30 zosyn > 11/2  11/2 ceftaz > 11/5  11/7 cefepime > 11/14  Interim history/subjective:   Dyssynchrony has improved overnight. Increased respiratory rate and Vt to correct hypercapnia.  Objective   Blood pressure (!) 153/66, pulse 83, temperature 99.1 F (37.3 C), temperature source Oral, resp. rate (!) 30, height 5\' 11"  (1.803 m), weight 120.3 kg, SpO2 98 %.    Vent Mode: PRVC FiO2 (%):  [40 %] 40 % Set Rate:  [30 bmp] 30 bmp Vt Set:  [600 mL] 600 mL PEEP:  [5 cmH20] 5 cmH20 Plateau Pressure:  [21 cmH20-26 cmH20] 26 cmH20   Intake/Output Summary (Last 24 hours) at 04/09/2019 1012 Last data filed at 04/09/2019 0400 Gross per 24 hour  Intake 4558.62 ml  Output 2935 ml  Net 1623.62 ml   Filed Weights   04/03/19 0645  04/08/19 0500 04/09/19 0500  Weight: 105.2 kg 119.6 kg 120.3 kg    Examination:  General:  Intubated and sedated. Obese HENT: ETT/OGT in place with no pressure injury PULM: bronchial breathing as bases posteriorly, ventilator dyssynchrony resolved. Copious white secretions have improved.  Maintaining saturation.  Pplat 20 and on RR 30 CV: RRR, no mgr GI: BS+, soft, nontender MSK: normal bulk and tonet Neuro: Opens eyes faintly to voice.  Weakly moves all limbs.  Shivering from cooling blanket.  Resolved Hospital Problem list     Assessment & Plan:   Critically ill due to acute hypoxic and hypercapneic respiratory failure requiring mechanical ventilation.  On lung protective ventilation protocol with acceptable lung mechanics  Ventilator dyssynchrony has improved. Remains hypercarbic - high dead space ventilation.  - Wean sedation as tolerated - Continue lung protective ventilation. Vt currently at 20m/kg and Pplat acceptable - Continue frequent suctioning  - Increase diuresis, as still in positive fluid balance.  ARDS due to COVID 19 pneumonia Secondary bacterial pneumonia due to P.aeruginosa and MSSA. Completed course of treatment for COVID-19 - Continue antibacterial coverage for both organisms given copious purulent secretions.  Complete 7-day course. - Guaifenasin as mucolytic  Was critically ill due to hypotension requiring titration of phenylephrine, likely sedation related Now off vasopressors  Hypernatremia Secondary to insensible losses from fever - continue free water repletion.  Best practice:  Diet: continue tube feeding Pain/Anxiety/Delirium protocol (if indicated): dilaudid and dexmedetomidine. Lorazepam prn. VAP protocol (if indicated): yes DVT prophylaxis: lovenox 0.5mg   sub q bid GI prophylaxis: Pantoprazole for stress ulcer prophylaxis Glucose control: SSI Mobility: bed rest Code Status: Full Family Communication: Dr Denese Killings updated his wife by phone  on 11/8.  He informed her that he was making progress with sedation and ventilator weaning.  He told her that he optimistically thought that he may be able to start weaning trials by 11/11 Disposition: remain in ICU  Labs   CBC: Recent Labs  Lab 04/04/19 0546 04/05/19 0238  04/06/19 0546  04/08/19 0207 04/08/19 0256 04/08/19 0918 04/09/19 0410 04/09/19 0916  WBC 5.3 4.7  --  5.1  --  5.7  --   --  6.4  --   HGB 12.7* 11.2*   < > 10.0*   < > 8.8* 8.5* 8.5* 9.0* 9.2*  HCT 42.5 40.2   < > 35.1*   < > 30.4* 25.0* 25.0* 30.0* 27.0*  MCV 94.2 99.0  --  98.0  --  94.7  --   --  92.3  --   PLT 338 275  --  253  --  230  --   --  230  --    < > = values in this interval not displayed.    Basic Metabolic Panel: Recent Labs  Lab 04/05/19 0238  04/06/19 0546  04/07/19 0555  04/08/19 0207 04/08/19 0256 04/08/19 0918 04/09/19 0410 04/09/19 0916  NA 155*   < > 154*   < > 151*   < > 148* 146* 146* 146* 145  K 4.4   < > 4.0   < > 4.1   < > 3.8 3.5 3.4* 3.5 3.4*  CL 109  --  108  --  107  --  106  --   --  102  --   CO2 39*  --  39*  --  36*  --  35*  --   --  35*  --   GLUCOSE 362*  --  200*  --  138*  --  163*  --   --  95  --   BUN 71*  --  53*  --  49*  --  47*  --   --  45*  --   CREATININE 1.11  --  0.92  --  0.86  --  0.89  --   --  0.80  --   CALCIUM 8.2*  --  8.2*  --  8.1*  --  8.1*  --   --  8.4*  --   MG 2.9*  --   --   --   --   --   --   --   --   --   --   PHOS 3.7  --  1.1*  --  4.2  --   --   --   --   --   --    < > = values in this interval not displayed.   GFR: Estimated Creatinine Clearance: 131.2 mL/min (by C-G formula based on SCr of 0.8 mg/dL). Recent Labs  Lab 04/05/19 0238 04/06/19 0546 04/08/19 0207 04/09/19 0410  WBC 4.7 5.1 5.7 6.4    Liver Function Tests: Recent Labs  Lab 04/03/19 0645 04/04/19 0546 04/05/19 0238 04/06/19 0546 04/08/19 0207 04/09/19 0410  AST 100* 40 22  --  58* 49*  ALT 182* 113* 70*  --  45* 40  ALKPHOS 49 44 46  --   58 62  BILITOT 0.9 0.6 0.6  --  0.5 0.4  PROT 6.0* 5.7* 5.8*  --  5.4* 5.9*  ALBUMIN 2.1* 2.0* 2.3* 2.0* 1.8* 1.9*   No results for input(s): LIPASE, AMYLASE in the last 168 hours. No results for input(s): AMMONIA in the last 168 hours.  ABG    Component Value Date/Time   PHART 7.411 04/09/2019 0916   PCO2ART 58.9 (H) 04/09/2019 0916   PO2ART 96.0 04/09/2019 0916   HCO3 37.2 (H) 04/09/2019 0916   TCO2 39 (H) 04/09/2019 0916   ACIDBASEDEF 3.0 (H) 03/26/2019 1442   O2SAT 97.0 04/09/2019 0916     Coagulation Profile: Recent Labs  Lab 04/07/19 2000  INR 1.0    Cardiac Enzymes: No results for input(s): CKTOTAL, CKMB, CKMBINDEX, TROPONINI in the last 168 hours.  HbA1C: Hgb A1c MFr Bld  Date/Time Value Ref Range Status  03/06/2019 01:00 PM 7.5 (H) 4.8 - 5.6 % Final    Comment:    (NOTE) Pre diabetes:          5.7%-6.4% Diabetes:              >6.4% Glycemic control for   <7.0% adults with diabetes     CBG: Recent Labs  Lab 04/08/19 1141 04/08/19 1623 04/08/19 2006 04/08/19 2348 04/09/19 0745  GLUCAP 167* 162* 174* 208* 129*     CRITICAL CARE Performed by: Lynnell Catalanavi Bulah Lurie   Total critical care time: 40 minutes  Critical care time was exclusive of separately billable procedures and treating other patients.  Critical care was necessary to treat or prevent imminent or life-threatening deterioration.  Critical care was time spent personally by me on the following activities: development of treatment plan with patient and/or surrogate as well as nursing, discussions with consultants, evaluation of patient's response to treatment, examination of patient, obtaining history from patient or surrogate, ordering and performing treatments and interventions, ordering and review of laboratory studies, ordering and review of radiographic studies, pulse oximetry, re-evaluation of patient's condition and participation in multidisciplinary rounds.  Lynnell Catalanavi Magan Winnett, MD [facility] ICU  Physician [facility]  Pager: [phone] Mobile: [phone] After hours: [phone].

## 2019-04-09 NOTE — Progress Notes (Signed)
Called and updated patient's wife, [name], all questions/ concerns answered at this time.

## 2019-04-09 NOTE — Progress Notes (Signed)
ANTICOAGULATION CONSULT NOTE - Follow Up Consult  Pharmacy Consult for Heparin Indication: Rule out VTE   Patient Measurements: Height: 5\' 11"  (180.3 cm) Weight: 265 lb 3.4 oz (120.3 kg) IBW/kg (Calculated) : 75.3 Heparin Dosing Weight: 97.4 kg  Vital Signs: Temp: 98.8 F (37.1 C) (11/10 1214) Temp Source: Oral (11/10 1214) BP: 160/82 (11/10 1300) Pulse Rate: 92 (11/10 1300)  Labs: Recent Labs    04/07/19 0555  04/07/19 2000 04/08/19 0207  04/08/19 0918 04/08/19 1755 04/09/19 0410 04/09/19 0916 04/09/19 1250  HGB  --    < >  --  8.8*   < > 8.5*  --  9.0* 9.2*  --   HCT  --    < >  --  30.4*   < > 25.0*  --  30.0* 27.0*  --   PLT  --   --   --  230  --   --   --  230  --   --   APTT  --   --  37*  --   --   --   --   --   --   --   LABPROT  --   --  13.2  --   --   --   --   --   --   --   INR  --   --  1.0  --   --   --   --   --   --   --   HEPARINUNFRC  --   --   --  0.28*   < >  --  0.20* 0.42  --  0.26*  CREATININE 0.86  --   --  0.89  --   --   --  0.80  --   --    < > = values in this interval not displayed.    Estimated Creatinine Clearance: 131.2 mL/min (by C-G formula based on SCr of 0.8 mg/dL).    Assessment: 58 yo male admitted 03/15/2019 with COVID-19 pneumonia requiring mechanical ventilation.  He has been receiving VTE prophylaxis with lovenox 0.5mg /kg SQ q12h, LE dopplers negative on 11/1.  Pharmacy consulted to begin IV heparin on 04/07/19 for r/o PE/DVT with persistent hypercarbia.  LE dopplers negative on 11/8 per MD documentation but concern for PE remains. Currently on IV heparin at 1950 units/hr with slightly subtherapeutic anti-Xa level. H/H low stable, Plt wnl    Goal of Therapy:  Heparin level 0.3-0.7 units/ml Monitor platelets by anticoagulation protocol: Yes    Plan:  Increase heparin infusion to 2050 units/hr  Confirmatory heparin level at 2200 today  Daily heparin level and CBC Monitor for signs/symptoms of bleeding  Albertina Parr, PharmD., BCPS Clinical Pharmacist Clinical phone for 04/09/19 until 5pm: [phone]

## 2019-04-10 LAB — CBC
HCT: 31.1 % — ABNORMAL LOW (ref 39.0–52.0)
Hemoglobin: 9.3 g/dL — ABNORMAL LOW (ref 13.0–17.0)
MCH: 27.5 pg (ref 26.0–34.0)
MCHC: 29.9 g/dL — ABNORMAL LOW (ref 30.0–36.0)
MCV: 92 fL (ref 80.0–100.0)
Platelets: 242 10*3/uL (ref 150–400)
RBC: 3.38 MIL/uL — ABNORMAL LOW (ref 4.22–5.81)
RDW: 14.4 % (ref 11.5–15.5)
WBC: 7.2 10*3/uL (ref 4.0–10.5)
nRBC: 0 % (ref 0.0–0.2)

## 2019-04-10 LAB — POCT I-STAT 7, (LYTES, BLD GAS, ICA,H+H)
Acid-Base Excess: 16 mmol/L — ABNORMAL HIGH (ref 0.0–2.0)
Bicarbonate: 42.1 mmol/L — ABNORMAL HIGH (ref 20.0–28.0)
Calcium, Ion: 1.18 mmol/L (ref 1.15–1.40)
HCT: 22 % — ABNORMAL LOW (ref 39.0–52.0)
Hemoglobin: 7.5 g/dL — ABNORMAL LOW (ref 13.0–17.0)
O2 Saturation: 94 %
Patient temperature: 99
Potassium: 3.7 mmol/L (ref 3.5–5.1)
Sodium: 142 mmol/L (ref 135–145)
TCO2: 44 mmol/L — ABNORMAL HIGH (ref 22–32)
pCO2 arterial: 63 mmHg — ABNORMAL HIGH (ref 32.0–48.0)
pH, Arterial: 7.434 (ref 7.350–7.450)
pO2, Arterial: 75 mmHg — ABNORMAL LOW (ref 83.0–108.0)

## 2019-04-10 LAB — BASIC METABOLIC PANEL
Anion gap: 10 (ref 5–15)
BUN: 37 mg/dL — ABNORMAL HIGH (ref 6–20)
CO2: 39 mmol/L — ABNORMAL HIGH (ref 22–32)
Calcium: 8.3 mg/dL — ABNORMAL LOW (ref 8.9–10.3)
Chloride: 94 mmol/L — ABNORMAL LOW (ref 98–111)
Creatinine, Ser: 0.83 mg/dL (ref 0.61–1.24)
GFR calc Af Amer: >60 mL/min (ref >60)
GFR calc non Af Amer: >60 mL/min (ref >60)
Glucose, Bld: 115 mg/dL — ABNORMAL HIGH (ref 70–99)
Potassium: 3.9 mmol/L (ref 3.5–5.1)
Sodium: 143 mmol/L (ref 135–145)

## 2019-04-10 LAB — GLUCOSE, CAPILLARY
Glucose-Capillary: 100 mg/dL — ABNORMAL HIGH (ref 70–99)
Glucose-Capillary: 123 mg/dL — ABNORMAL HIGH (ref 70–99)
Glucose-Capillary: 144 mg/dL — ABNORMAL HIGH (ref 70–99)
Glucose-Capillary: 181 mg/dL — ABNORMAL HIGH (ref 70–99)
Glucose-Capillary: 200 mg/dL — ABNORMAL HIGH (ref 70–99)
Glucose-Capillary: 83 mg/dL (ref 70–99)

## 2019-04-10 LAB — HEPARIN LEVEL (UNFRACTIONATED): Heparin Unfractionated: 0.35 IU/mL (ref 0.30–0.70)

## 2019-04-10 MED ORDER — OXYCODONE HCL 5 MG PO TABS
10.0000 mg | ORAL_TABLET | Freq: Four times a day (QID) | ORAL | Status: DC
  Administered 2019-04-10 – 2019-04-15 (×21): 10 mg via ORAL
  Filled 2019-04-10 (×21): qty 2

## 2019-04-10 MED ORDER — QUETIAPINE FUMARATE 50 MG PO TABS
50.0000 mg | ORAL_TABLET | Freq: Two times a day (BID) | ORAL | Status: DC
  Administered 2019-04-10 – 2019-04-11 (×3): 50 mg via ORAL
  Filled 2019-04-10 (×3): qty 1

## 2019-04-10 NOTE — Progress Notes (Signed)
ANTICOAGULATION CONSULT NOTE - Follow Up Consult  Pharmacy Consult for Heparin Indication: Rule out VTE   Patient Measurements: Height: 5\' 11"  (180.3 cm) Weight: 265 lb 3.4 oz (120.3 kg) IBW/kg (Calculated) : 75.3 Heparin Dosing Weight: 97.4 kg  Vital Signs: Temp: 99.2 F (37.3 C) (11/10 1945) Temp Source: Oral (11/10 1945) BP: 153/95 (11/11 0000) Pulse Rate: 71 (11/11 0000)  Labs: Recent Labs    04/07/19 0555  04/07/19 2000 04/08/19 0207  04/08/19 0918  04/09/19 0410 04/09/19 0916 04/09/19 1250 04/09/19 2205  HGB  --    < >  --  8.8*   < > 8.5*  --  9.0* 9.2*  --   --   HCT  --    < >  --  30.4*   < > 25.0*  --  30.0* 27.0*  --   --   PLT  --   --   --  230  --   --   --  230  --   --   --   APTT  --   --  37*  --   --   --   --   --   --   --   --   LABPROT  --   --  13.2  --   --   --   --   --   --   --   --   INR  --   --  1.0  --   --   --   --   --   --   --   --   HEPARINUNFRC  --   --   --  0.28*   < >  --    < > 0.42  --  0.26* 0.31  CREATININE 0.86  --   --  0.89  --   --   --  0.80  --   --   --    < > = values in this interval not displayed.    Estimated Creatinine Clearance: 131.2 mL/min (by C-G formula based on SCr of 0.8 mg/dL).  Assessment: 58 yo male admitted 04/09/19 with COVID-19 pneumonia requiring mechanical ventilation.  He has been receiving VTE prophylaxis with lovenox 0.5mg /kg SQ q12h, LE dopplers negative on 11/1.  Pharmacy consulted to begin IV heparin on 04/07/19 for r/o PE/DVT with persistent hypercarbia.  LE dopplers negative on 11/8 per MD documentation but concern for PE remains. Currently on IV heparin at 1950 units/hr with slightly subtherapeutic anti-Xa level. H/H low stable, Plt wnl   Goal of Therapy:  Heparin level 0.3-0.7 units/ml Monitor platelets by anticoagulation protocol: Yes   04/09/19 2200 PM UPDATE Heparin level: 0.31 IU/mL at 2205, which is within therapeutic goal range No signs of bleeding noted   Plan:    Continue heparin  infusion at 2050 units/hr  Re-check Heparin level at 0700 on 04-10-19  Daily Hep level and CBC  F/U long-term anticoagulation plans    Despina Pole, Pharm. D. Clinical Pharmacist 04/10/2019 12:46 AM

## 2019-04-10 NOTE — Progress Notes (Signed)
Spoke with patient's wife, [name], and provided full update/answered all questions.

## 2019-04-10 NOTE — Progress Notes (Signed)
NAME:  Angel Stuart, MRN:  [id number], DOB:  September 15, 1960, LOS: 32 ADMISSION DATE:  03/02/2019, CONSULTATION DATE:  10/26 REFERRING MD:  Thereasa Solo, CHIEF COMPLAINT:  Dyspnea   Brief History   58 y/o male admitted on 10/26 with ARDS from [place] 19 pneumonia required intubation in the [facility] ED.    Past Medical History  HTN HLD DM2  Significant Hospital Events   10/26 admission to ICU, prone position 10/30 culture positive pseudomonas 11/2 worsening oxygenation 11/5 minimal ventilator settings, some air hunger on exam 11/6 atrial fib with RVR 11/8 hypercapnia with normal oxygenation. Consults:  PCCM  Procedures:  10/26 ETT >  10/26 L subclavian CVL > 11/4  Significant Diagnostic Tests:  11/1 Lower extremity doppler ultrasound> negative for DVT 11/5 TTE > LVEF 60-65%, mild LVH, normal RV 11/6 chest x-ray-persistent bilateral interstitial infiltrates consistent with SARS-CoV-2 pneumonia.  Minimal change since 11/2 (personally reviewed)  Micro Data:  10/29 blood >  10/30 sputum > pseudomonas, gram negative rods, GPC; staph aureus (MSSA)  Antimicrobials/COVID RX:  10/27 Remdesivir > 10/30 10/27 decadron > 11/4  10/30 vanc > 11/2 10/30 zosyn > 11/2  11/2 ceftaz > 11/5  11/7 cefepime > 11/14  Interim history/subjective:   Less dyssynchrony, secretions have also improved.  Less diarrhea.  Objective   Blood pressure 97/63, pulse 91, temperature 98.7 F (37.1 C), temperature source Oral, resp. rate (!) 32, height 5\' 11"  (1.803 m), weight 114.8 kg, SpO2 96 %.    Vent Mode: PRVC FiO2 (%):  [40 %] 40 % Set Rate:  [30 bmp] 30 bmp Vt Set:  [520 mL] 520 mL PEEP:  [5 cmH20-8 cmH20] 8 cmH20 Plateau Pressure:  [20 cmH20-28 cmH20] 28 cmH20   Intake/Output Summary (Last 24 hours) at 04/10/2019 1211 Last data filed at 04/10/2019 1030 Gross per 24 hour  Intake 6157.51 ml  Output 6420 ml  Net -262.49 ml   Filed Weights   04/08/19 0500 04/09/19 0500 04/10/19 0500   Weight: 119.6 kg 120.3 kg 114.8 kg    Examination:  General:  Intubated and sedated. Obese HENT: ETT/OGT in place with no pressure injury PULM: bronchial breathing as bases posteriorly, ventilator dyssynchrony resolved. Copious white secretions have improved.  Maintaining saturation.  CV: RRR, no mgr GI: BS+, soft, nontender MSK: normal bulk and tonet Neuro: Opens eyes faintly to voice.  Weakly moves all limbs.    Resolved Hospital Problem list     Assessment & Plan:   Critically ill due to acute hypoxic and hypercapneic respiratory failure requiring mechanical ventilation.  On lung protective ventilation protocol with acceptable lung mechanics  Ventilator dyssynchrony has improved. Remains hypercarbic - high dead space ventilation.  - Wean sedation as tolerated - Continue lung protective ventilation. Vt currently at 9m/kg and Pplat acceptable - Continue frequent suctioning  - Increase diuresis, as still in positive fluid balance.  ARDS due to COVID 19 pneumonia Secondary bacterial pneumonia due to P.aeruginosa and MSSA. Completed course of treatment for COVID-19 - Continue antibacterial coverage for both organisms given copious purulent secretions.  Complete 7-day course. - Guaifenasin as mucolytic  Was critically ill due to hypotension requiring titration of phenylephrine, likely sedation related Now off vasopressors Continue diuresis.  Hypernatremia Secondary to insensible losses from fever - continue free water repletion.  Best practice:  Diet: continue tube feeding Pain/Anxiety/Delirium protocol (if indicated): dilaudid and dexmedetomidine. Lorazepam prn.  Start transition to enteral sedation to facilitate weaning of infusion. VAP protocol (if indicated): yes DVT prophylaxis: lovenox  0.5mg  sub q bid GI prophylaxis: Pantoprazole for stress ulcer prophylaxis Glucose control: SSI Mobility: bed rest Code Status: Full Family Communication: Dr Denese Killings updated his  wife by phone on 11/10.  He informed her that he was making progress with sedation and ventilator weaning.   Disposition: remain in ICU  Labs   CBC: Recent Labs  Lab 04/05/19 0238  04/06/19 0546  04/08/19 0207  04/08/19 0918 04/09/19 0410 04/09/19 0916 04/10/19 0452 04/10/19 0640  WBC 4.7  --  5.1  --  5.7  --   --  6.4  --   --  7.2  HGB 11.2*   < > 10.0*   < > 8.8*   < > 8.5* 9.0* 9.2* 7.5* 9.3*  HCT 40.2   < > 35.1*   < > 30.4*   < > 25.0* 30.0* 27.0* 22.0* 31.1*  MCV 99.0  --  98.0  --  94.7  --   --  92.3  --   --  92.0  PLT 275  --  253  --  230  --   --  230  --   --  242   < > = values in this interval not displayed.    Basic Metabolic Panel: Recent Labs  Lab 04/05/19 0238  04/06/19 0546  04/07/19 0555  04/08/19 0207  04/08/19 0918 04/09/19 0410 04/09/19 0916 04/10/19 0452 04/10/19 0640  NA 155*   < > 154*   < > 151*   < > 148*   < > 146* 146* 145 142 143  K 4.4   < > 4.0   < > 4.1   < > 3.8   < > 3.4* 3.5 3.4* 3.7 3.9  CL 109  --  108  --  107  --  106  --   --  102  --   --  94*  CO2 39*  --  39*  --  36*  --  35*  --   --  35*  --   --  39*  GLUCOSE 362*  --  200*  --  138*  --  163*  --   --  95  --   --  115*  BUN 71*  --  53*  --  49*  --  47*  --   --  45*  --   --  37*  CREATININE 1.11  --  0.92  --  0.86  --  0.89  --   --  0.80  --   --  0.83  CALCIUM 8.2*  --  8.2*  --  8.1*  --  8.1*  --   --  8.4*  --   --  8.3*  MG 2.9*  --   --   --   --   --   --   --   --   --   --   --   --   PHOS 3.7  --  1.1*  --  4.2  --   --   --   --   --   --   --   --    < > = values in this interval not displayed.   GFR: Estimated Creatinine Clearance: 123.5 mL/min (by C-G formula based on SCr of 0.83 mg/dL). Recent Labs  Lab 04/06/19 0546 04/08/19 0207 04/09/19 0410 04/10/19 0640  WBC 5.1 5.7 6.4 7.2    Liver Function Tests: Recent Labs  Lab  04/04/19 0546 04/05/19 0238 04/06/19 0546 04/08/19 0207 04/09/19 0410  AST 40 22  --  58* 49*  ALT 113* 70*   --  45* 40  ALKPHOS 44 46  --  58 62  BILITOT 0.6 0.6  --  0.5 0.4  PROT 5.7* 5.8*  --  5.4* 5.9*  ALBUMIN 2.0* 2.3* 2.0* 1.8* 1.9*   No results for input(s): LIPASE, AMYLASE in the last 168 hours. No results for input(s): AMMONIA in the last 168 hours.  ABG    Component Value Date/Time   PHART 7.434 04/10/2019 0452   PCO2ART 63.0 (H) 04/10/2019 0452   PO2ART 75.0 (L) 04/10/2019 0452   HCO3 42.1 (H) 04/10/2019 0452   TCO2 44 (H) 04/10/2019 0452   ACIDBASEDEF 3.0 (H) 03/24/2019 1442   O2SAT 94.0 04/10/2019 0452     Coagulation Profile: Recent Labs  Lab 04/07/19 2000  INR 1.0    Cardiac Enzymes: No results for input(s): CKTOTAL, CKMB, CKMBINDEX, TROPONINI in the last 168 hours.  HbA1C: Hgb A1c MFr Bld  Date/Time Value Ref Range Status  03/16/2019 01:00 PM 7.5 (H) 4.8 - 5.6 % Final    Comment:    (NOTE) Pre diabetes:          5.7%-6.4% Diabetes:              >6.4% Glycemic control for   <7.0% adults with diabetes     CBG: Recent Labs  Lab 04/09/19 1702 04/09/19 1956 04/10/19 0005 04/10/19 0401 04/10/19 0813  GLUCAP 168* 163* 144* 83 123*     CRITICAL CARE Performed by: Lynnell Catalanavi Toney Lizaola   Total critical care time: 40 minutes  Critical care time was exclusive of separately billable procedures and treating other patients.  Critical care was necessary to treat or prevent imminent or life-threatening deterioration.  Critical care was time spent personally by me on the following activities: development of treatment plan with patient and/or surrogate as well as nursing, discussions with consultants, evaluation of patient's response to treatment, examination of patient, obtaining history from patient or surrogate, ordering and performing treatments and interventions, ordering and review of laboratory studies, ordering and review of radiographic studies, pulse oximetry, re-evaluation of patient's condition and participation in multidisciplinary rounds.  Lynnell Catalanavi  Noe Goyer, MD [facility] ICU Physician [facility]  Pager: [phone] Mobile: [phone] After hours: [phone].

## 2019-04-10 NOTE — Progress Notes (Signed)
ANTICOAGULATION CONSULT NOTE - Follow Up Consult  Pharmacy Consult for Heparin Indication: Rule out VTE   Patient Measurements: Height: 5\' 11"  (180.3 cm) Weight: 253 lb 1.4 oz (114.8 kg) IBW/kg (Calculated) : 75.3 Heparin Dosing Weight: 97.4 kg  Vital Signs: Temp: 98.7 F (37.1 C) (11/11 0800) Temp Source: Oral (11/11 0800) BP: 100/58 (11/11 0801) Pulse Rate: 89 (11/11 0801)  Labs: Recent Labs    04/07/19 2000 04/08/19 0207  04/09/19 0410 04/09/19 0916 04/09/19 1250 04/09/19 2205 04/10/19 0452 04/10/19 0640  HGB  --  8.8*   < > 9.0* 9.2*  --   --  7.5* 9.3*  HCT  --  30.4*   < > 30.0* 27.0*  --   --  22.0* 31.1*  PLT  --  230  --  230  --   --   --   --  242  APTT 37*  --   --   --   --   --   --   --   --   LABPROT 13.2  --   --   --   --   --   --   --   --   INR 1.0  --   --   --   --   --   --   --   --   HEPARINUNFRC  --  0.28*   < > 0.42  --  0.26* 0.31  --  0.35  CREATININE  --  0.89  --  0.80  --   --   --   --  0.83   < > = values in this interval not displayed.    Estimated Creatinine Clearance: 123.5 mL/min (by C-G formula based on SCr of 0.83 mg/dL).  Assessment: 58 yo male admitted Apr 15, 2019 with COVID-19 pneumonia requiring mechanical ventilation.  He has been receiving VTE prophylaxis with lovenox 0.5mg /kg SQ q12h, LE dopplers negative on 11/1.  Pharmacy consulted to begin IV heparin on 04/07/19 for r/o PE/DVT with persistent hypercarbia.  LE dopplers negative on 11/8 per MD documentation but concern for PE remains. Currently on IV heparin at 2050 units/hr with therapeutic anti-Xa level. H/H low stable, Plt wnl   Goal of Therapy:  Heparin level 0.3-0.7 units/ml Monitor platelets by anticoagulation protocol: Yes     Plan:   Continue heparin infusion at 2050 units/hr  Daily Hep level and CBC  F/U long-term anticoagulation plans  Albertina Parr, PharmD., BCPS Clinical Pharmacist Clinical phone for 04/10/19 until 5pm: [phone]

## 2019-04-11 LAB — POCT I-STAT 7, (LYTES, BLD GAS, ICA,H+H)
Acid-Base Excess: 17 mmol/L — ABNORMAL HIGH (ref 0.0–2.0)
Bicarbonate: 42.4 mmol/L — ABNORMAL HIGH (ref 20.0–28.0)
Calcium, Ion: 1.14 mmol/L — ABNORMAL LOW (ref 1.15–1.40)
HCT: 25 % — ABNORMAL LOW (ref 39.0–52.0)
Hemoglobin: 8.5 g/dL — ABNORMAL LOW (ref 13.0–17.0)
O2 Saturation: 97 %
Patient temperature: 99
Potassium: 3.8 mmol/L (ref 3.5–5.1)
Sodium: 137 mmol/L (ref 135–145)
TCO2: 44 mmol/L — ABNORMAL HIGH (ref 22–32)
pCO2 arterial: 59.6 mmHg — ABNORMAL HIGH (ref 32.0–48.0)
pH, Arterial: 7.461 — ABNORMAL HIGH (ref 7.350–7.450)
pO2, Arterial: 93 mmHg (ref 83.0–108.0)

## 2019-04-11 LAB — CBC
HCT: 30.4 % — ABNORMAL LOW (ref 39.0–52.0)
Hemoglobin: 9.1 g/dL — ABNORMAL LOW (ref 13.0–17.0)
MCH: 27.3 pg (ref 26.0–34.0)
MCHC: 29.9 g/dL — ABNORMAL LOW (ref 30.0–36.0)
MCV: 91.3 fL (ref 80.0–100.0)
Platelets: 251 10*3/uL (ref 150–400)
RBC: 3.33 MIL/uL — ABNORMAL LOW (ref 4.22–5.81)
RDW: 14.8 % (ref 11.5–15.5)
WBC: 11.7 10*3/uL — ABNORMAL HIGH (ref 4.0–10.5)
nRBC: 0 % (ref 0.0–0.2)

## 2019-04-11 LAB — GLUCOSE, CAPILLARY
Glucose-Capillary: 124 mg/dL — ABNORMAL HIGH (ref 70–99)
Glucose-Capillary: 126 mg/dL — ABNORMAL HIGH (ref 70–99)
Glucose-Capillary: 140 mg/dL — ABNORMAL HIGH (ref 70–99)
Glucose-Capillary: 146 mg/dL — ABNORMAL HIGH (ref 70–99)
Glucose-Capillary: 168 mg/dL — ABNORMAL HIGH (ref 70–99)
Glucose-Capillary: 72 mg/dL (ref 70–99)

## 2019-04-11 LAB — BASIC METABOLIC PANEL
Anion gap: 10 (ref 5–15)
BUN: 37 mg/dL — ABNORMAL HIGH (ref 6–20)
CO2: 39 mmol/L — ABNORMAL HIGH (ref 22–32)
Calcium: 8.4 mg/dL — ABNORMAL LOW (ref 8.9–10.3)
Chloride: 90 mmol/L — ABNORMAL LOW (ref 98–111)
Creatinine, Ser: 0.86 mg/dL (ref 0.61–1.24)
GFR calc Af Amer: >60 mL/min (ref >60)
GFR calc non Af Amer: >60 mL/min (ref >60)
Glucose, Bld: 134 mg/dL — ABNORMAL HIGH (ref 70–99)
Potassium: 3.9 mmol/L (ref 3.5–5.1)
Sodium: 139 mmol/L (ref 135–145)

## 2019-04-11 LAB — HEPARIN LEVEL (UNFRACTIONATED): Heparin Unfractionated: 0.31 IU/mL (ref 0.30–0.70)

## 2019-04-11 MED ORDER — ROSUVASTATIN CALCIUM 20 MG PO TABS
20.0000 mg | ORAL_TABLET | Freq: Every day | ORAL | Status: DC
  Administered 2019-04-11 – 2019-04-29 (×17): 20 mg
  Filled 2019-04-11 (×20): qty 1

## 2019-04-11 MED ORDER — ZINC GLUCONATE 50 MG PO TABS: 50.0000 mg | ORAL_TABLET | Freq: Every day | ORAL | Status: DC

## 2019-04-11 MED ORDER — ROSUVASTATIN CALCIUM 20 MG PO TABS: 20.0000 mg | ORAL_TABLET | Freq: Every day | ORAL | Status: DC

## 2019-04-11 MED ORDER — GUAIFENESIN 100 MG/5ML PO SOLN
10.0000 mL | Freq: Four times a day (QID) | ORAL | Status: DC | PRN
  Administered 2019-04-12 – 2019-04-13 (×2): 200 mg
  Filled 2019-04-11 (×2): qty 15

## 2019-04-11 MED ORDER — QUETIAPINE FUMARATE 50 MG PO TABS
50.0000 mg | ORAL_TABLET | Freq: Every day | ORAL | Status: DC
  Administered 2019-04-12 – 2019-04-14 (×3): 50 mg
  Filled 2019-04-11 (×3): qty 1

## 2019-04-11 MED ORDER — ASPIRIN EC 81 MG PO TBEC
81.0000 mg | DELAYED_RELEASE_TABLET | Freq: Every day | ORAL | Status: DC
  Administered 2019-04-11 – 2019-04-15 (×5): 81 mg via ORAL
  Filled 2019-04-11 (×5): qty 1

## 2019-04-11 MED ORDER — ZINC SULFATE 220 (50 ZN) MG PO CAPS
220.0000 mg | ORAL_CAPSULE | Freq: Every day | ORAL | Status: DC
  Administered 2019-04-11 – 2019-04-30 (×19): 220 mg
  Filled 2019-04-11 (×20): qty 1

## 2019-04-11 MED ORDER — FREE WATER
200.0000 mL | Freq: Three times a day (TID) | Status: DC
  Administered 2019-04-11 – 2019-04-17 (×17): 200 mL

## 2019-04-11 NOTE — Progress Notes (Signed)
ANTICOAGULATION CONSULT NOTE - Follow Up Consult  Pharmacy Consult for Heparin Indication: Rule out VTE   Patient Measurements: Height: 5\' 11"  (180.3 cm) Weight: 258 lb 6.1 oz (117.2 kg) IBW/kg (Calculated) : 75.3 Heparin Dosing Weight: 97.4 kg  Vital Signs: Temp: 99 F (37.2 C) (11/12 0733) Temp Source: Axillary (11/12 0733) BP: 112/64 (11/12 0733) Pulse Rate: 95 (11/12 0733)  Labs: Recent Labs    04/09/19 0410  04/09/19 2205  04/10/19 0640 04/11/19 0535 04/11/19 0630  HGB 9.0*   < >  --    < > 9.3* 8.5* 9.1*  HCT 30.0*   < >  --    < > 31.1* 25.0* 30.4*  PLT 230  --   --   --  242  --  251  HEPARINUNFRC 0.42   < > 0.31  --  0.35  --  0.31  CREATININE 0.80  --   --   --  0.83  --  0.86   < > = values in this interval not displayed.    Estimated Creatinine Clearance: 120.5 mL/min (by C-G formula based on SCr of 0.86 mg/dL).  Assessment: 58 yo male admitted March 26, 2019 with COVID-19 pneumonia requiring mechanical ventilation.  He has been receiving VTE prophylaxis with lovenox 0.5mg /kg SQ q12h, LE dopplers negative on 11/1.  Pharmacy consulted to begin IV heparin on 04/07/19 for r/o PE/DVT with persistent hypercarbia.  LE dopplers negative on 11/8 per MD documentation but concern for PE remains. Currently on IV heparin at 2050 units/hr with therapeutic anti-Xa level. H/H low stable, Plt wnl   Goal of Therapy:  Heparin level 0.3-0.7 units/ml Monitor platelets by anticoagulation protocol: Yes     Plan:   Continue heparin infusion at 2050 units/hr  Daily Hep level and CBC  F/U long-term anticoagulation plans  Albertina Parr, PharmD., BCPS Clinical Pharmacist Clinical phone for 04/11/19 until 5pm: [phone]

## 2019-04-11 NOTE — Progress Notes (Signed)
Updated pt's wife over the phone.  Explained that HCAP seems to be responding to ABx, hemodynamics are stable, and he started doing pressure support weaning.  Explained that if he continues in this trend, then might be ready for extubation trial over the weekend but we will have to wait and see.  Chesley Mires, MD [facility] Pulmonary/Critical Care 04/11/2019, 1:41 PM

## 2019-04-11 NOTE — Progress Notes (Signed)
Pt placed back on PRVC due to increased RR in the 50's.  Pt did 2.5 hours on PSV/CPAP 12/8.

## 2019-04-11 NOTE — Progress Notes (Signed)
Called patient's wife, [name] and gave updates on patient's condition and plan of care. Answered all of her questions regarding patient care. She is very grateful to the entire staff at [facility] for the care patient is receiving.

## 2019-04-11 NOTE — Progress Notes (Signed)
NAME:  Angel Stuart, MRN:  [id number], DOB:  [birth year], LOS: 17 ADMISSION DATE:  04-20-2019, CONSULTATION DATE:  10/26 REFERRING MD:  Sharon Seller, CHIEF COMPLAINT:  Dyspnea   Brief History   58 yo male developed respiratory symptoms 10/05, and tested positive for COVID 19 on 10/09.  Presented to [facility] ER 10/25 with progressive dyspnea.  Found to have hypoxia and b/l pulmonary infiltrates, required intubation and transferred to [facility].  Past Medical History  HTN, HLD, DM  Significant Hospital Events   10/26 admission to ICU, prone position 10/29 fever 11/02 worsening oxygenation, persistent fever >> change ABx 11/05 d/c ABx 11/06 atrial fib with RVR 11/07 recurrent fever, change ABx 11/12 start pressure support weaning  Consults:    Procedures:  10/26 ETT >  10/26 L subclavian CVL > 11/4  Significant Diagnostic Tests:  Doppler legs b/l 11/01 >> no DVT Echo 11/05 >> EF 60 to 65%, grade 1 DD, mild LVH  Micro Data:  Blood 10/29 >> negative Sputum 10/30 >> MSSA, Pseudomonas Sputum 11/07 >> Pseudomonas  COVID Therapy:  Remdesivir 10/26 >> 10/30 Decadron 10/26 >> 11/04 Convalescent plasma 10/26  Antibiotics:  Zosyn 10/30 >>11/02 Vancomycin 10/30 >> 10/31 Fortaz 11/02 >> 11/05 Cefepime 11/07 >>   Interim history/subjective:  Pressure support this AM 12/8.  Heparin, lasix gtt.  Objective   Blood pressure 129/73, pulse 77, temperature 99.2 F (37.3 C), temperature source Axillary, resp. rate (!) 27, height 5\' 11"  (1.803 m), weight 117.2 kg, SpO2 100 %.    Vent Mode: PRVC FiO2 (%):  [40 %] 40 % Set Rate:  [30 bmp] 30 bmp Vt Set:  [520 mL] 520 mL PEEP:  [8 cmH20] 8 cmH20 Pressure Support:  [12 cmH20] 12 cmH20 Plateau Pressure:  [23 cmH20-28 cmH20] 23 cmH20   Intake/Output Summary (Last 24 hours) at 04/11/2019 1001 Last data filed at 04/11/2019 0900 Gross per 24 hour  Intake 3474 ml  Output 4075 ml  Net -601 ml   Filed Weights   04/09/19 0500  04/10/19 0500 04/11/19 0430  Weight: 120.3 kg 114.8 kg 117.2 kg    Examination:  General - sedated Eyes - pupils reactive ENT - ETT in place Cardiac - regular rate/rhythm, no murmur Chest - b/l rhonchi Abdomen - soft, non tender, + bowel sounds Extremities - 1+ edema Skin - no rashes Neuro - RASS -2  Resolved Hospital Problem list   Septic shock, MSSA HCAP, Hypernatremia  Assessment & Plan:   Acute hypoxic, hypercapnic respiratory failure with ARDS from COVID 19 pneumonia. - completed course of remdesivir, decadron, convalescent plasma - pressure support as able - goal SpO2 88 to 95% - f/u CXR - continue lasix gtt with goal negative fluid balance  Pseudomonal HCAP. - day 6/10 of cefepime  New onset A fib with RVR. Hx of HTN, HLD. - continue amiodarone - continue heparin gtt; might be able to transition to enteral anticoagulation soon - ASA, crestor  Acute metabolic encephalopathy. - RASS goal 0 to -1 - continue klonopin, seroquel, oxycodone  Anemia of critical illness. - f/u CBC - transfuse for Hb < 7 or significant bleeding  DM type II. - SSI with lantus  Best practice:  Diet: tube feeds DVT prophylaxis: heparin gtt GI prophylaxis: protonix Mobility: bed rest Code Status: Full Disposition: remain in ICU  Labs    CMP Latest Ref Rng & Units 04/11/2019 04/11/2019 04/10/2019  Glucose 70 - 99 mg/dL 13/03/2019) - 354(S)  BUN 6 - 20 mg/dL 568(L) -  37(H)  Creatinine 0.61 - 1.24 mg/dL 0.86 - 0.83  Sodium 135 - 145 mmol/L 139 137 143  Potassium 3.5 - 5.1 mmol/L 3.9 3.8 3.9  Chloride 98 - 111 mmol/L 90(L) - 94(L)  CO2 22 - 32 mmol/L 39(H) - 39(H)  Calcium 8.9 - 10.3 mg/dL 8.4(L) - 8.3(L)  Total Protein 6.5 - 8.1 g/dL - - -  Total Bilirubin 0.3 - 1.2 mg/dL - - -  Alkaline Phos 38 - 126 U/L - - -  AST 15 - 41 U/L - - -  ALT 0 - 44 U/L - - -    CBC Latest Ref Rng & Units 04/11/2019 04/11/2019 04/10/2019  WBC 4.0 - 10.5 K/uL 11.7(H) - 7.2  Hemoglobin 13.0 -  17.0 g/dL 9.1(L) 8.5(L) 9.3(L)  Hematocrit 39.0 - 52.0 % 30.4(L) 25.0(L) 31.1(L)  Platelets 150 - 400 K/uL 251 - 242    ABG    Component Value Date/Time   PHART 7.461 (H) 04/11/2019 0535   PCO2ART 59.6 (H) 04/11/2019 0535   PO2ART 93.0 04/11/2019 0535   HCO3 42.4 (H) 04/11/2019 0535   TCO2 44 (H) 04/11/2019 0535   ACIDBASEDEF 3.0 (H) 03/03/2019 1442   O2SAT 97.0 04/11/2019 0535    CBG (last 3)  Recent Labs    04/10/19 2357 04/11/19 0425 04/11/19 0737  GLUCAP 168* 126* 146*    CC time 38 minutes  Chesley Mires, MD [place] 04/11/2019, 10:37 AM

## 2019-04-12 ENCOUNTER — Other Ambulatory Visit: Payer: Self-pay

## 2019-04-12 ENCOUNTER — Encounter (HOSPITAL_COMMUNITY): Payer: Self-pay

## 2019-04-12 ENCOUNTER — Inpatient Hospital Stay (HOSPITAL_COMMUNITY): Payer: PRIVATE HEALTH INSURANCE

## 2019-04-12 LAB — CBC
HCT: 29.3 % — ABNORMAL LOW (ref 39.0–52.0)
Hemoglobin: 9.1 g/dL — ABNORMAL LOW (ref 13.0–17.0)
MCH: 28.1 pg (ref 26.0–34.0)
MCHC: 31.1 g/dL (ref 30.0–36.0)
MCV: 90.4 fL (ref 80.0–100.0)
Platelets: 247 10*3/uL (ref 150–400)
RBC: 3.24 MIL/uL — ABNORMAL LOW (ref 4.22–5.81)
RDW: 15 % (ref 11.5–15.5)
WBC: 17.1 10*3/uL — ABNORMAL HIGH (ref 4.0–10.5)
nRBC: 0 % (ref 0.0–0.2)

## 2019-04-12 LAB — COMPREHENSIVE METABOLIC PANEL
ALT: 28 U/L (ref 0–44)
AST: 27 U/L (ref 15–41)
Albumin: 1.8 g/dL — ABNORMAL LOW (ref 3.5–5.0)
Alkaline Phosphatase: 88 U/L (ref 38–126)
Anion gap: 11 (ref 5–15)
BUN: 35 mg/dL — ABNORMAL HIGH (ref 6–20)
CO2: 38 mmol/L — ABNORMAL HIGH (ref 22–32)
Calcium: 8.6 mg/dL — ABNORMAL LOW (ref 8.9–10.3)
Chloride: 90 mmol/L — ABNORMAL LOW (ref 98–111)
Creatinine, Ser: 0.79 mg/dL (ref 0.61–1.24)
GFR calc Af Amer: >60 mL/min (ref >60)
GFR calc non Af Amer: >60 mL/min (ref >60)
Glucose, Bld: 119 mg/dL — ABNORMAL HIGH (ref 70–99)
Potassium: 4.3 mmol/L (ref 3.5–5.1)
Sodium: 139 mmol/L (ref 135–145)
Total Bilirubin: 0.4 mg/dL (ref 0.3–1.2)
Total Protein: 6.3 g/dL — ABNORMAL LOW (ref 6.5–8.1)

## 2019-04-12 LAB — GLUCOSE, CAPILLARY
Glucose-Capillary: 112 mg/dL — ABNORMAL HIGH (ref 70–99)
Glucose-Capillary: 114 mg/dL — ABNORMAL HIGH (ref 70–99)
Glucose-Capillary: 119 mg/dL — ABNORMAL HIGH (ref 70–99)
Glucose-Capillary: 130 mg/dL — ABNORMAL HIGH (ref 70–99)
Glucose-Capillary: 132 mg/dL — ABNORMAL HIGH (ref 70–99)
Glucose-Capillary: 134 mg/dL — ABNORMAL HIGH (ref 70–99)
Glucose-Capillary: 189 mg/dL — ABNORMAL HIGH (ref 70–99)

## 2019-04-12 LAB — HEPARIN LEVEL (UNFRACTIONATED): Heparin Unfractionated: 0.32 IU/mL (ref 0.30–0.70)

## 2019-04-12 MED ORDER — POLYETHYLENE GLYCOL 3350 17 G PO PACK
17.0000 g | PACK | Freq: Two times a day (BID) | ORAL | Status: DC
  Administered 2019-04-12 – 2019-04-27 (×20): 17 g
  Filled 2019-04-12 (×23): qty 1

## 2019-04-12 MED ORDER — APIXABAN 5 MG PO TABS
5.0000 mg | ORAL_TABLET | Freq: Two times a day (BID) | ORAL | Status: DC
  Administered 2019-04-12 – 2019-04-14 (×5): 5 mg via ORAL
  Filled 2019-04-12 (×5): qty 1

## 2019-04-12 NOTE — Progress Notes (Signed)
NAME:  Angel Stuart, MRN:  [id number], DOB:  1961-05-09, LOS: 18 ADMISSION DATE:  03/19/2019, CONSULTATION DATE:  10/26 REFERRING MD:  Sharon Seller, CHIEF COMPLAINT:  Dyspnea   Brief History   58 yo male developed respiratory symptoms 10/05, and tested positive for COVID 19 on 10/09.  Presented to [facility] ER 10/25 with progressive dyspnea.  Found to have hypoxia and b/l pulmonary infiltrates, required intubation and transferred to [facility].  Past Medical History  HTN, HLD, DM  Significant Hospital Events   10/26 admission to ICU, prone position 10/29 fever 11/02 worsening oxygenation, persistent fever >> change ABx 11/05 d/c ABx 11/06 atrial fib with RVR 11/07 recurrent fever, change ABx 11/12 start pressure support weaning  Consults:    Procedures:  10/26 ETT >  10/26 L subclavian CVL > 11/4  Significant Diagnostic Tests:  Doppler legs b/l 11/01 >> no DVT Echo 11/05 >> EF 60 to 65%, grade 1 DD, mild LVH  Micro Data:  Blood 10/29 >> negative Sputum 10/30 >> MSSA, Pseudomonas Sputum 11/07 >> Pseudomonas  COVID Therapy:  Remdesivir 10/26 >> 10/30 Decadron 10/26 >> 11/04 Convalescent plasma 10/26  Antibiotics:  Zosyn 10/30 >>11/02 Vancomycin 10/30 >> 10/31 Fortaz 11/02 >> 11/05 Cefepime 11/07 >>   Interim history/subjective:  Pressure support 10/8.  Did PS for about 2 hours on 11/12.  Remains on lasix gtt and heparin gtt.  Objective   Blood pressure (!) 151/64, pulse 94, temperature 98.1 F (36.7 C), resp. rate 18, height 5\' 11"  (1.803 m), weight 112.5 kg, SpO2 97 %.    Vent Mode: PSV;CPAP FiO2 (%):  [40 %] 40 % Set Rate:  [30 bmp] 30 bmp Vt Set:  [520 mL] 520 mL PEEP:  [8 cmH20] 8 cmH20 Pressure Support:  [10 cmH20] 10 cmH20 Plateau Pressure:  [24 cmH20-26 cmH20] 25 cmH20   Intake/Output Summary (Last 24 hours) at 04/12/2019 0940 Last data filed at 04/12/2019 0900 Gross per 24 hour  Intake 3135.07 ml  Output 5265 ml  Net -2129.93 ml   Filed  Weights   04/10/19 0500 04/11/19 0430 04/12/19 0500  Weight: 114.8 kg 117.2 kg 112.5 kg    Examination:  General - sedated Eyes - pupils reactive ENT - ETT in place Cardiac - regular rate/rhythm, no murmur Chest - b/l rhonchi Abdomen - soft, non tender, + bowel sounds Extremities - no cyanosis, clubbing, or edema Skin - no rashes Neuro - RASS -1  CXR (reviewed by me) - b/l interstitial ASD  Resolved Hospital Problem list   Septic shock, MSSA HCAP, Hypernatremia  Assessment & Plan:   Acute hypoxic, hypercapnic respiratory failure with ARDS from COVID 19 pneumonia. - completed course of remdesivir, decadron, convalescent plasma - pressure support wean as able; getting closer to point of extubation trial - goal SpO2 88 to 95% - f/u CXR intermittently - continue lasix gtt for now  Pseudomonal HCAP. - day 7/10 of cefepime  New onset A fib with RVR. Hx of HTN, HLD. - continue amiodarone, ASA, crestor - will ask pharmacy to transition from heparin gtt to eliquis  Acute metabolic encephalopathy. - RASS goal 0 to -1 - continue klonopin, seroquel, oxycodone  Anemia of critical illness. - f/u CBC - transfuse for Hb < 7 or significant bleeding  Moderate protein calorie malnutrition. - continue tube feeds  DM type II. - SSI with lantus  Best practice:  Diet: tube feeds DVT prophylaxis: heparin gtt GI prophylaxis: protonix Mobility: bed rest Code Status: Full Disposition: remain in  ICU  Labs    CMP Latest Ref Rng & Units 04/12/2019 04/11/2019 04/11/2019  Glucose 70 - 99 mg/dL 119(H) 134(H) -  BUN 6 - 20 mg/dL 35(H) 37(H) -  Creatinine 0.61 - 1.24 mg/dL 0.79 0.86 -  Sodium 135 - 145 mmol/L 139 139 137  Potassium 3.5 - 5.1 mmol/L 4.3 3.9 3.8  Chloride 98 - 111 mmol/L 90(L) 90(L) -  CO2 22 - 32 mmol/L 38(H) 39(H) -  Calcium 8.9 - 10.3 mg/dL 8.6(L) 8.4(L) -  Total Protein 6.5 - 8.1 g/dL 6.3(L) - -  Total Bilirubin 0.3 - 1.2 mg/dL 0.4 - -  Alkaline Phos 38 - 126  U/L 88 - -  AST 15 - 41 U/L 27 - -  ALT 0 - 44 U/L 28 - -    CBC Latest Ref Rng & Units 04/12/2019 04/11/2019 04/11/2019  WBC 4.0 - 10.5 K/uL 17.1(H) 11.7(H) -  Hemoglobin 13.0 - 17.0 g/dL 9.1(L) 9.1(L) 8.5(L)  Hematocrit 39.0 - 52.0 % 29.3(L) 30.4(L) 25.0(L)  Platelets 150 - 400 K/uL 247 251 -    ABG    Component Value Date/Time   PHART 7.461 (H) 04/11/2019 0535   PCO2ART 59.6 (H) 04/11/2019 0535   PO2ART 93.0 04/11/2019 0535   HCO3 42.4 (H) 04/11/2019 0535   TCO2 44 (H) 04/11/2019 0535   ACIDBASEDEF 3.0 (H) 2019-04-24 1442   O2SAT 97.0 04/11/2019 0535    CBG (last 3)  Recent Labs    04/12/19 0020 04/12/19 0331 04/12/19 [place], MD [place] Pulmonary/Critical Care 04/12/2019, 9:40 AM

## 2019-04-12 NOTE — Discharge Instructions (Addendum)
COVID-19: How to Protect Yourself and Others Know how it spreads  There is currently no vaccine to prevent coronavirus disease 2019 (COVID-19).  The best way to prevent illness is to avoid being exposed to this virus.  The virus is thought to spread mainly from person-to-person. ? Between people who are in close contact with one another (within about 6 feet). ? Through respiratory droplets produced when an infected person coughs, sneezes or talks. ? These droplets can land in the mouths or noses of people who are nearby or possibly be inhaled into the lungs. ? Some recent studies have suggested that COVID-19 may be spread by people who are not showing symptoms. Everyone should Clean your hands often  Wash your hands often with soap and water for at least 20 seconds especially after you have been in a public place, or after blowing your nose, coughing, or sneezing.  If soap and water are not readily available, use a hand sanitizer that contains at least 60% alcohol. Cover all surfaces of your hands and rub them together until they feel dry.  Avoid touching your eyes, nose, and mouth with unwashed hands. Avoid close contact  Stay home if you are sick.  Avoid close contact with people who are sick.  Put distance between yourself and other people. ? Remember that some people without symptoms may be able to spread virus. ? This is especially important for people who are at higher risk of getting very sick.www.cdc.gov/coronavirus/2019-ncov/need-extra-precautions/people-at-higher-risk.html Cover your mouth and nose with a cloth face cover when around others  You could spread COVID-19 to others even if you do not feel sick.  Everyone should wear a cloth face cover when they have to go out in public, for example to the grocery store or to pick up other necessities. ? Cloth face coverings should not be placed on young children under age 2, anyone who has trouble breathing, or is unconscious,  incapacitated or otherwise unable to remove the mask without assistance.  The cloth face cover is meant to protect other people in case you are infected.  Do NOT use a facemask meant for a healthcare worker.  Continue to keep about 6 feet between yourself and others. The cloth face cover is not a substitute for social distancing. Cover coughs and sneezes  If you are in a private setting and do not have on your cloth face covering, remember to always cover your mouth and nose with a tissue when you cough or sneeze or use the inside of your elbow.  Throw used tissues in the trash.  Immediately wash your hands with soap and water for at least 20 seconds. If soap and water are not readily available, clean your hands with a hand sanitizer that contains at least 60% alcohol. Clean and disinfect  Clean AND disinfect frequently touched surfaces daily. This includes tables, doorknobs, light switches, countertops, handles, desks, phones, keyboards, toilets, faucets, and sinks. www.cdc.gov/coronavirus/2019-ncov/prevent-getting-sick/disinfecting-your-home.html  If surfaces are dirty, clean them: Use detergent or soap and water prior to disinfection.  Then, use a household disinfectant. You can see a list of EPA-registered household disinfectants here. cdc.gov/coronavirus 10/02/2018 This information is not intended to replace advice given to you by your health care provider. Make sure you discuss any questions you have with your health care provider. Document Released: 09/11/2018 Document Revised: 10/10/2018 Document Reviewed: 09/11/2018 Elsevier Patient Education  2020 Elsevier Inc.  

## 2019-04-12 NOTE — Progress Notes (Signed)
ANTICOAGULATION & ANTIBIOTIC CONSULT NOTE - Follow Up Consult  Pharmacy Consult for Heparin>apixaban; Cefepime  Indication: Afib; HCAP    Patient Measurements: Height: 5\' 11"  (180.3 cm) Weight: 248 lb 0.3 oz (112.5 kg) IBW/kg (Calculated) : 75.3 Heparin Dosing Weight: 97.4 kg  Vital Signs: Temp: 98.4 F (36.9 C) (11/13 0700) Temp Source: Rectal (11/13 0400) BP: 122/61 (11/13 0700) Pulse Rate: 92 (11/13 0700)  Labs: Recent Labs    04/10/19 0640 04/11/19 0535 04/11/19 0630 04/12/19 0605  HGB 9.3* 8.5* 9.1* 9.1*  HCT 31.1* 25.0* 30.4* 29.3*  PLT 242  --  251 247  HEPARINUNFRC 0.35  --  0.31 0.32  CREATININE 0.83  --  0.86 0.79    Estimated Creatinine Clearance: 126.8 mL/min (by C-G formula based on SCr of 0.79 mg/dL).  Assessment: 58 yo male admitted 03/08/2019 with COVID-19 pneumonia requiring mechanical ventilation.    AC: He has been receiving VTE prophylaxis with lovenox 0.5mg /kg SQ q12h, LE dopplers negative on 11/1 and 11/8. Patient then developed Afib. Now transitioning to apixaban.   H/H low stable. Plt wnl. SCr wnl   ID: Currently on D7/7 of Cefepime for HCAP. Recently finished a course of Ceftazidime for pseudomonas and MSSA pneumonia. Of note, patient's leukocytosis has worsened today. Patient remains afebrile.    Goal of Therapy:  Heparin level 0.3-0.7 units/ml Monitor platelets by anticoagulation protocol: Yes     Plan:  -Stop IV heparin -Start apixaban 5 mg twice daily  -Monitor CBC and s/s of bleeding   -Finish Cefepime course today. Will continue to monitor leukocytosis curve   Albertina Parr, PharmD., BCPS Clinical Pharmacist Clinical phone for 04/12/19 until 5pm: [phone]

## 2019-04-12 NOTE — Progress Notes (Signed)
Called pt's wife to update of pt condition and plan of care. Pt's wife appreciative of update 

## 2019-04-13 ENCOUNTER — Inpatient Hospital Stay (HOSPITAL_COMMUNITY): Payer: PRIVATE HEALTH INSURANCE

## 2019-04-13 DIAGNOSIS — L8990 Pressure ulcer of unspecified site, unspecified stage: Secondary | ICD-10-CM | POA: Insufficient documentation

## 2019-04-13 DIAGNOSIS — J189 Pneumonia, unspecified organism: Secondary | ICD-10-CM

## 2019-04-13 LAB — BASIC METABOLIC PANEL
Anion gap: 12 (ref 5–15)
BUN: 38 mg/dL — ABNORMAL HIGH (ref 6–20)
CO2: 35 mmol/L — ABNORMAL HIGH (ref 22–32)
Calcium: 8.8 mg/dL — ABNORMAL LOW (ref 8.9–10.3)
Chloride: 91 mmol/L — ABNORMAL LOW (ref 98–111)
Creatinine, Ser: 0.99 mg/dL (ref 0.61–1.24)
GFR calc Af Amer: >60 mL/min (ref >60)
GFR calc non Af Amer: >60 mL/min (ref >60)
Glucose, Bld: 110 mg/dL — ABNORMAL HIGH (ref 70–99)
Potassium: 5.3 mmol/L — ABNORMAL HIGH (ref 3.5–5.1)
Sodium: 138 mmol/L (ref 135–145)

## 2019-04-13 LAB — CBC
HCT: 30 % — ABNORMAL LOW (ref 39.0–52.0)
Hemoglobin: 9.1 g/dL — ABNORMAL LOW (ref 13.0–17.0)
MCH: 27.6 pg (ref 26.0–34.0)
MCHC: 30.3 g/dL (ref 30.0–36.0)
MCV: 90.9 fL (ref 80.0–100.0)
Platelets: 294 10*3/uL (ref 150–400)
RBC: 3.3 MIL/uL — ABNORMAL LOW (ref 4.22–5.81)
RDW: 15.2 % (ref 11.5–15.5)
WBC: 29.1 10*3/uL — ABNORMAL HIGH (ref 4.0–10.5)
nRBC: 0 % (ref 0.0–0.2)

## 2019-04-13 LAB — MAGNESIUM: Magnesium: 1.4 mg/dL — ABNORMAL LOW (ref 1.7–2.4)

## 2019-04-13 LAB — GLUCOSE, CAPILLARY
Glucose-Capillary: 133 mg/dL — ABNORMAL HIGH (ref 70–99)
Glucose-Capillary: 118 mg/dL — ABNORMAL HIGH (ref 70–99)
Glucose-Capillary: 121 mg/dL — ABNORMAL HIGH (ref 70–99)
Glucose-Capillary: 164 mg/dL — ABNORMAL HIGH (ref 70–99)
Glucose-Capillary: 214 mg/dL — ABNORMAL HIGH (ref 70–99)
Glucose-Capillary: 155 mg/dL — ABNORMAL HIGH (ref 70–99)

## 2019-04-13 LAB — TRIGLYCERIDES: Triglycerides: 103 mg/dL (ref <150)

## 2019-04-13 MED ORDER — MAGNESIUM SULFATE 2 GM/50ML IV SOLN
2.0000 g | Freq: Once | INTRAVENOUS | Status: AC
  Administered 2019-04-13: 11:00:00 2 g via INTRAVENOUS
  Filled 2019-04-13: qty 50

## 2019-04-13 MED ORDER — VANCOMYCIN HCL 10 G IV SOLR
2000.0000 mg | Freq: Once | INTRAVENOUS | Status: AC
  Administered 2019-04-13: 2000 mg via INTRAVENOUS
  Filled 2019-04-13: qty 2000

## 2019-04-13 MED ORDER — VANCOMYCIN HCL IN DEXTROSE 1-5 GM/200ML-% IV SOLN
1000.0000 mg | Freq: Two times a day (BID) | INTRAVENOUS | Status: DC
  Administered 2019-04-13 – 2019-04-15 (×4): 1000 mg via INTRAVENOUS
  Filled 2019-04-13 (×4): qty 200

## 2019-04-13 MED ORDER — SODIUM CHLORIDE 0.9 % IV SOLN
6.0000 g | Freq: Once | INTRAVENOUS | Status: DC
  Filled 2019-04-13: qty 12

## 2019-04-13 MED ORDER — MAGNESIUM SULFATE 4 GM/100ML IV SOLN
4.0000 g | Freq: Once | INTRAVENOUS | Status: AC
  Administered 2019-04-13: 4 g via INTRAVENOUS
  Filled 2019-04-13: qty 100

## 2019-04-13 MED ORDER — PROPOFOL 1000 MG/100ML IV EMUL: 5.0000 ug/kg/min | INTRAVENOUS | Status: DC

## 2019-04-13 NOTE — Progress Notes (Signed)
[facility] notified [facility] 1.4, to order replacement.

## 2019-04-13 NOTE — Progress Notes (Signed)
NAME:  Angel Stuart, MRN:  [id number], DOB:  [birth year], LOS: 19 ADMISSION DATE:  04/14/19, CONSULTATION DATE:  10/26 REFERRING MD:  Sharon Seller, CHIEF COMPLAINT:  Dyspnea   Brief History   58 yo male developed respiratory symptoms 10/05, and tested positive for COVID 19 on 10/09.  Presented to [facility] ER 10/25 with progressive dyspnea.  Found to have hypoxia and b/l pulmonary infiltrates, required intubation and transferred to [facility].  Past Medical History  HTN, HLD, DM  Significant Hospital Events   10/26 admission to ICU, prone position 10/29 fever 11/02 worsening oxygenation, persistent fever >> change ABx 11/05 d/c ABx 11/06 atrial fib with RVR 11/07 recurrent fever, change ABx 11/12 start pressure support weaning 1114 Fever 103.1, adjust ABx for HCAP  Consults:    Procedures:  10/26 ETT >  10/26 L subclavian CVL > 11/4  Significant Diagnostic Tests:  Doppler legs b/l 11/01 >> no DVT Echo 11/05 >> EF 60 to 65%, grade 1 DD, mild LVH  Micro Data:  Blood 10/29 >> negative Sputum 10/30 >> MSSA, Pseudomonas Sputum 11/07 >> Pseudomonas Sputum 11/14 >>   COVID Therapy:  Remdesivir 10/26 >> 10/30 Decadron 10/26 >> 11/04 Convalescent plasma 10/26  Antibiotics:  Zosyn 10/30 >>11/02 Vancomycin 10/30 >> 10/31 Fortaz 11/02 >> 11/05 Cefepime 11/07 >>  Vancomycin 11/14 >>   Interim history/subjective:  Fever 103.1.  Still has thick, yellow/green sputum.    Objective   Blood pressure 131/72, pulse 92, temperature 98.8 F (37.1 C), resp. rate (!) 28, height 5\' 11"  (1.803 m), weight 112.3 kg, SpO2 100 %.    Vent Mode: PSV;CPAP FiO2 (%):  [40 %] 40 % Set Rate:  [30 bmp] 30 bmp Vt Set:  [520 mL] 520 mL PEEP:  [5 cmH20-8 cmH20] 5 cmH20 Pressure Support:  [8 cmH20-10 cmH20] 8 cmH20 Plateau Pressure:  [24 cmH20-30 cmH20] 26 cmH20   Intake/Output Summary (Last 24 hours) at 04/13/2019 0831 Last data filed at 04/13/2019 0600 Gross per 24 hour  Intake 3486.38  ml  Output 4025 ml  Net -538.62 ml   Filed Weights   04/11/19 0430 04/12/19 0500 04/13/19 0500  Weight: 117.2 kg 112.5 kg 112.3 kg    Examination:  General - sedated Eyes - pupils reactive ENT - ETT in place Cardiac - regular rate/rhythm, no murmur Chest - b/l rhonchi Abdomen - soft, non tender, + bowel sounds Extremities - no cyanosis, clubbing, or edema Skin - no rashes Neuro - normal strength, moves extremities, follows commands   Resolved Hospital Problem list   Septic shock, MSSA HCAP, Hypernatremia  Assessment & Plan:   Acute hypoxic, hypercapnic respiratory failure with ARDS from COVID 19 pneumonia. - completed course of remdesivir, decadron, convalescent plasma - pressure support wean as able; mental status, respiratory secretions barrier to extubation trial - might need trach to assist with vent weaning - goal SpO2 88 to 95% - f/u CXR  Pseudomonal HCAP with new fever 11/14. - day 8 of cefepime - repeat sputum culture - add back vancomycin pending culture results  New onset A fib with RVR. Hx of HTN, HLD. - continue amiodarone, ASA, crestor, eliquis  Hyperkalemia, metabolic alkalosis. - might be developing contraction alkalosis from diuresis - will d/c lasix gtt 11/14  Acute metabolic encephalopathy. - RASS goal 0 to -1 - continue klonopin, seroquel, oxycodone  Anemia of critical illness. - f/u CBC - transfuse for Hb < 7 or significant bleeding  Moderate protein calorie malnutrition. - continue tube feeds  DM  type II. - SSI with lantus  Best practice:  Diet: tube feeds DVT prophylaxis: heparin gtt GI prophylaxis: protonix Mobility: bed rest Code Status: Full Disposition: remain in ICU  Labs    CMP Latest Ref Rng & Units 04/13/2019 04/12/2019 04/11/2019  Glucose 70 - 99 mg/dL 110(H) 119(H) 134(H)  BUN 6 - 20 mg/dL 38(H) 35(H) 37(H)  Creatinine 0.61 - 1.24 mg/dL 0.99 0.79 0.86  Sodium 135 - 145 mmol/L 138 139 139  Potassium 3.5 - 5.1  mmol/L 5.3(H) 4.3 3.9  Chloride 98 - 111 mmol/L 91(L) 90(L) 90(L)  CO2 22 - 32 mmol/L 35(H) 38(H) 39(H)  Calcium 8.9 - 10.3 mg/dL 8.8(L) 8.6(L) 8.4(L)  Total Protein 6.5 - 8.1 g/dL - 6.3(L) -  Total Bilirubin 0.3 - 1.2 mg/dL - 0.4 -  Alkaline Phos 38 - 126 U/L - 88 -  AST 15 - 41 U/L - 27 -  ALT 0 - 44 U/L - 28 -    CBC Latest Ref Rng & Units 04/13/2019 04/12/2019 04/11/2019  WBC 4.0 - 10.5 K/uL 29.1(H) 17.1(H) 11.7(H)  Hemoglobin 13.0 - 17.0 g/dL 9.1(L) 9.1(L) 9.1(L)  Hematocrit 39.0 - 52.0 % 30.0(L) 29.3(L) 30.4(L)  Platelets 150 - 400 K/uL 294 247 251    ABG    Component Value Date/Time   PHART 7.461 (H) 04/11/2019 0535   PCO2ART 59.6 (H) 04/11/2019 0535   PO2ART 93.0 04/11/2019 0535   HCO3 42.4 (H) 04/11/2019 0535   TCO2 44 (H) 04/11/2019 0535   ACIDBASEDEF 3.0 (H) 2019/04/02 1442   O2SAT 97.0 04/11/2019 0535    CBG (last 3)  Recent Labs    04/12/19 2002 04/13/19 0117 04/13/19 0459  GLUCAP 134* 133* 118*    CC time 34 minutes  Chesley Mires, MD [facility] Pulmonary/Critical Care 04/13/2019, 8:31 AM

## 2019-04-13 NOTE — Progress Notes (Signed)
[place] Progress Note Patient Name: Angel Stuart DOB: 11/28/1960 MRN: [id number]   Date of Service  04/13/2019  HPI/Events of Note  Sub-optimal sedation  eICU Interventions  Propofol infusion ordered.        [name] 04/13/2019, 4:22 AM

## 2019-04-13 NOTE — Progress Notes (Signed)
[place] Progress Note Patient Name: Angel Stuart DOB: [birth year] MRN: [id number]   Date of Service  04/13/2019  HPI/Events of Note  Mg+ 1.4  eICU Interventions  MgSo4 6 gm iv over 6 hours ordered.        [name] 04/13/2019, 5:39 AM

## 2019-04-13 NOTE — Progress Notes (Signed)
10 cc saline instilled down ETT and suctioned back moderate amt of thick, yellow/tan secretions.  Patient tolerated well, no complication.

## 2019-04-13 NOTE — Progress Notes (Signed)
Spoke with pt's wife over the phone.  Updated about barriers to vent weaning >> fever, respiratory secretions, mental status, deconditioning.  He has been on vent for 19 days now.  Discussed option of tracheostomy to assist with vent weaning.  She is agreeable to this.  Will likely need to arrange later this week.  Chesley Mires, MD [facility] Pulmonary/Critical Care 04/13/2019, 1:48 PM

## 2019-04-13 NOTE — Progress Notes (Signed)
ETT flushed with 10 mL normal saline per MD order.  Moderate yellow tan secretions obtained.

## 2019-04-13 NOTE — Progress Notes (Signed)
Pharmacy Antibiotic Note  Angel Stuart is a 58 y.o. male admitted on 03/20/2019 with pneumonia.  Patient is currently on D#8 of IV Cefepime with breakthrough fevers and worsening leukocytosis. Pharmacy consulted to add IV vancomycin.   WBC up to 29, Tm 103.66F  Plan: -Continue Cefepime 2 gm IV Q 8 hours  -Vancomycin 2 gm IV load, then Vancomycin 1000 mg IV Q 12 hrs. Goal AUC 400-550. Expected AUC: 500 SCr used: 0.99 -Monitor CBC, renal fx, cultures and clinical progress -Vanc levels as indicated  -Repeat respiratory cultures today   Height: 5\' 11"  (180.3 cm) Weight: 247 lb 9.2 oz (112.3 kg) IBW/kg (Calculated) : 75.3  Temp (24hrs), Avg:99.9 F (37.7 C), Min:98.2 F (36.8 C), Max:103.1 F (39.5 C)  Recent Labs  Lab 04/09/19 0410 04/10/19 0640 04/11/19 0630 04/12/19 0605 04/13/19 0055  WBC 6.4 7.2 11.7* 17.1* 29.1*  CREATININE 0.80 0.83 0.86 0.79 0.99    Estimated Creatinine Clearance: 102.4 mL/min (by C-G formula based on SCr of 0.99 mg/dL).    Allergies  Allergen Reactions  . Clindamycin Hcl Other (See Comments)    Other reaction(s): Other (See Comments) Other reaction(s): ANAPHYLAXIS Other reaction(s): ANAPHYLAXIS Other reaction(s): Other (See Comments) Other reaction(s): ANAPHYLAXIS Other reaction(s): ANAPHYLAXIS Other reaction(s): ANAPHYLAXIS   . 5-Alpha Reductase Inhibitors   . Semaglutide Nausea And Vomiting  . Hydromorphone Nausea And Vomiting    Per patient Per patient Per patient     10/27 remdesivir >> 10/30 10/30 Vanc >> 11/1 10/30 Zosyn >> 11/2 11/2 Ceftazidime >> 11/5 11/7 Cefepime >> (11/13)  10/26 MRSA PCR: negative 10/29 BCx ng x5 days (final)  10/30 exp sputum: mod Pseudomonas aeruginosa  (pan-sens), mod MSSA 11/7 Tracheal asp: moderate pseudomonas (likely colonizer at this point)   Thank you for allowing pharmacy to be a part of this patient's care.  Albertina Parr, PharmD., BCPS Clinical Pharmacist Clinical  phone for 04/13/19 until 5pm: [phone]

## 2019-04-14 LAB — MAGNESIUM: Magnesium: 2.2 mg/dL (ref 1.7–2.4)

## 2019-04-14 LAB — BASIC METABOLIC PANEL
Anion gap: 10 (ref 5–15)
BUN: 37 mg/dL — ABNORMAL HIGH (ref 6–20)
CO2: 34 mmol/L — ABNORMAL HIGH (ref 22–32)
Calcium: 8.8 mg/dL — ABNORMAL LOW (ref 8.9–10.3)
Chloride: 94 mmol/L — ABNORMAL LOW (ref 98–111)
Creatinine, Ser: 0.69 mg/dL (ref 0.61–1.24)
GFR calc Af Amer: >60 mL/min (ref >60)
GFR calc non Af Amer: >60 mL/min (ref >60)
Glucose, Bld: 95 mg/dL (ref 70–99)
Potassium: 5.1 mmol/L (ref 3.5–5.1)
Sodium: 138 mmol/L (ref 135–145)

## 2019-04-14 LAB — POCT I-STAT 7, (LYTES, BLD GAS, ICA,H+H)
Acid-Base Excess: 12 mmol/L — ABNORMAL HIGH (ref 0.0–2.0)
Bicarbonate: 38 mmol/L — ABNORMAL HIGH (ref 20.0–28.0)
Calcium, Ion: 1.22 mmol/L (ref 1.15–1.40)
HCT: 22 % — ABNORMAL LOW (ref 39.0–52.0)
Hemoglobin: 7.5 g/dL — ABNORMAL LOW (ref 13.0–17.0)
O2 Saturation: 89 %
Patient temperature: 98
Potassium: 4.8 mmol/L (ref 3.5–5.1)
Sodium: 134 mmol/L — ABNORMAL LOW (ref 135–145)
TCO2: 40 mmol/L — ABNORMAL HIGH (ref 22–32)
pCO2 arterial: 58.4 mmHg — ABNORMAL HIGH (ref 32.0–48.0)
pH, Arterial: 7.421 (ref 7.350–7.450)
pO2, Arterial: 57 mmHg — ABNORMAL LOW (ref 83.0–108.0)

## 2019-04-14 LAB — CBC
HCT: 28.4 % — ABNORMAL LOW (ref 39.0–52.0)
Hemoglobin: 8.7 g/dL — ABNORMAL LOW (ref 13.0–17.0)
MCH: 27.9 pg (ref 26.0–34.0)
MCHC: 30.6 g/dL (ref 30.0–36.0)
MCV: 91 fL (ref 80.0–100.0)
Platelets: 295 10*3/uL (ref 150–400)
RBC: 3.12 MIL/uL — ABNORMAL LOW (ref 4.22–5.81)
RDW: 15.8 % — ABNORMAL HIGH (ref 11.5–15.5)
WBC: 24.6 10*3/uL — ABNORMAL HIGH (ref 4.0–10.5)
nRBC: 0 % (ref 0.0–0.2)

## 2019-04-14 LAB — GLUCOSE, CAPILLARY
Glucose-Capillary: 103 mg/dL — ABNORMAL HIGH (ref 70–99)
Glucose-Capillary: 127 mg/dL — ABNORMAL HIGH (ref 70–99)
Glucose-Capillary: 134 mg/dL — ABNORMAL HIGH (ref 70–99)
Glucose-Capillary: 137 mg/dL — ABNORMAL HIGH (ref 70–99)
Glucose-Capillary: 143 mg/dL — ABNORMAL HIGH (ref 70–99)
Glucose-Capillary: 73 mg/dL (ref 70–99)

## 2019-04-14 LAB — PHOSPHORUS: Phosphorus: 4 mg/dL (ref 2.5–4.6)

## 2019-04-14 MED ORDER — TAMSULOSIN HCL 0.4 MG PO CAPS
0.4000 mg | ORAL_CAPSULE | Freq: Every day | ORAL | Status: DC
  Administered 2019-04-14 – 2019-04-26 (×13): 0.4 mg via ORAL
  Filled 2019-04-14 (×13): qty 1

## 2019-04-14 MED ORDER — SODIUM CHLORIDE 3 % IN NEBU
4.0000 mL | INHALATION_SOLUTION | Freq: Two times a day (BID) | RESPIRATORY_TRACT | Status: AC
  Administered 2019-04-14 – 2019-04-16 (×6): 4 mL via RESPIRATORY_TRACT
  Filled 2019-04-14 (×7): qty 4

## 2019-04-14 MED ORDER — PROPOFOL 1000 MG/100ML IV EMUL
0.0000 ug/kg/min | INTRAVENOUS | Status: DC
  Administered 2019-04-14: 10:00:00 5 ug/kg/min via INTRAVENOUS
  Administered 2019-04-14: 10 ug/kg/min via INTRAVENOUS
  Administered 2019-04-15: 35 ug/kg/min via INTRAVENOUS
  Filled 2019-04-14 (×5): qty 100

## 2019-04-14 NOTE — Progress Notes (Signed)
NAME:  Angel Stuart, MRN:  [id number], DOB:  10-16-1960, LOS: 87 ADMISSION DATE:  02/28/2019, CONSULTATION DATE:  10/26 REFERRING MD:  Thereasa Solo, CHIEF COMPLAINT:  Dyspnea   Brief History   58 yo male developed respiratory symptoms 10/05, and tested positive for COVID 19 on 10/09.  Presented to [facility] ER 10/25 with progressive dyspnea.  Found to have hypoxia and b/l pulmonary infiltrates, required intubation and transferred to [facility].  Past Medical History  HTN, HLD, DM  Significant Hospital Events   10/26 admission to ICU, prone position 10/29 fever 11/02 worsening oxygenation, persistent fever >> change ABx 11/05 d/c ABx 11/06 atrial fib with RVR 11/07 recurrent fever, change ABx 11/12 start pressure support weaning 11/14 Fever 103.1, adjust ABx for HCAP 11/15 Add diprivan for sedation  Consults:    Procedures:  10/26 ETT >  10/26 L subclavian CVL > 11/4  Significant Diagnostic Tests:  Doppler legs b/l 11/01 >> no DVT Echo 11/05 >> EF 60 to 65%, grade 1 DD, mild LVH  Micro Data:  Blood 10/29 >> negative Sputum 10/30 >> MSSA, Pseudomonas Sputum 11/07 >> Pseudomonas Sputum 11/14 >>   COVID Therapy:  Remdesivir 10/26 >> 10/30 Decadron 10/26 >> 11/04 Convalescent plasma 10/26  Antibiotics:  Zosyn 10/30 >>11/02 Vancomycin 10/30 >> 10/31 Fortaz 11/02 >> 11/05 Cefepime 11/07 >>  Vancomycin 11/14 >>   Interim history/subjective:  On cooling blanket.  Still has thick secretions.  RR remains elevated.  Didn't tolerate pressure support.  Objective   Blood pressure 128/83, pulse (!) 104, temperature 98.4 F (36.9 C), resp. rate (!) 30, height 5\' 11"  (1.803 m), weight 111.4 kg, SpO2 93 %.    Vent Mode: PCV FiO2 (%):  [40 %] 40 % Set Rate:  [28 bmp-30 bmp] 28 bmp Vt Set:  [520 mL] 520 mL PEEP:  [5 cmH20] 5 cmH20 Plateau Pressure:  [20 cmH20-24 cmH20] 23 cmH20   Intake/Output Summary (Last 24 hours) at 04/14/2019 0914 Last data filed at 04/14/2019 8299  Gross per 24 hour  Intake 3007.74 ml  Output 2025 ml  Net 982.74 ml   Filed Weights   04/12/19 0500 04/13/19 0500 04/14/19 0500  Weight: 112.5 kg 112.3 kg 111.4 kg    Examination:  General - sedated Eyes - pupils reactive ENT - ETT in place Cardiac - regular rate/rhythm, no murmur Chest - b/l rhonchi Abdomen - soft, non tender, + bowel sounds Extremities - no cyanosis, clubbing, or edema Skin - no rashes Neuro - RASS -2   Resolved Hospital Problem list   Septic shock, MSSA HCAP, Hypernatremia  Assessment & Plan:   Acute hypoxic, hypercapnic respiratory failure with ARDS from COVID 19 pneumonia. - completed course of remdesivir, decadron, convalescent plasma - not making progress with vent weaning; his wife is agreeable to proceeding with trach later this week - f/u CXR - goal SpO2 88 to 95%  Pseudomonal HCAP with new fever 11/14. - day 9 of cefepime - day 2 of vancomycin - f/u sputum culture - if continues to have fever, then might need to consider adding antifungal therapy  New onset A fib with RVR. Hx of HTN, HLD. - continue amiodarone, ASA, crestor - hold eliquis in anticipation of tracheostomy later this week  Hyperkalemia, metabolic alkalosis from contraction alkalosis related to diuresis. - lasix gtt d/c'ed 11/14 - f/u BMET  Acute metabolic encephalopathy. - RASS goal 0 to -1 - add diprivan gtt 11/14 - continue dilaudid gtt - continue klonopin, seroquel, oxycodone  Anemia  of critical illness. - f/u CBC - transfuse for Hb < 7 or significant bleeding  Moderate protein calorie malnutrition. - continue tube feeds  DM type II. - SSI with lantus  Best practice:  Diet: tube feeds DVT prophylaxis: SCDs GI prophylaxis: protonix Mobility: bed rest Code Status: Full Disposition: remain in ICU  Labs    CMP Latest Ref Rng & Units 04/14/2019 04/13/2019 04/12/2019  Glucose 70 - 99 mg/dL 95 294(T) 654(Y)  BUN 6 - 20 mg/dL 50(P) 54(S) 56(C)   Creatinine 0.61 - 1.24 mg/dL 1.27 5.17 0.01  Sodium 135 - 145 mmol/L 138 138 139  Potassium 3.5 - 5.1 mmol/L 5.1 5.3(H) 4.3  Chloride 98 - 111 mmol/L 94(L) 91(L) 90(L)  CO2 22 - 32 mmol/L 34(H) 35(H) 38(H)  Calcium 8.9 - 10.3 mg/dL 7.4(B) 4.4(H) 6.7(R)  Total Protein 6.5 - 8.1 g/dL - - 6.3(L)  Total Bilirubin 0.3 - 1.2 mg/dL - - 0.4  Alkaline Phos 38 - 126 U/L - - 88  AST 15 - 41 U/L - - 27  ALT 0 - 44 U/L - - 28    CBC Latest Ref Rng & Units 04/14/2019 04/13/2019 04/12/2019  WBC 4.0 - 10.5 K/uL 24.6(H) 29.1(H) 17.1(H)  Hemoglobin 13.0 - 17.0 g/dL 9.1(M) 3.8(G) 6.6(Z)  Hematocrit 39.0 - 52.0 % 28.4(L) 30.0(L) 29.3(L)  Platelets 150 - 400 K/uL 295 294 247    ABG    Component Value Date/Time   PHART 7.461 (H) 04/11/2019 0535   PCO2ART 59.6 (H) 04/11/2019 0535   PO2ART 93.0 04/11/2019 0535   HCO3 42.4 (H) 04/11/2019 0535   TCO2 44 (H) 04/11/2019 0535   ACIDBASEDEF 3.0 (H) April 21, 2019 1442   O2SAT 97.0 04/11/2019 0535    CBG (last 3)  Recent Labs    04/13/19 1650 04/13/19 2058 04/14/19 0358  GLUCAP 214* 155* 73       Component Value Date/Time   CRP 14.2 (H) 04/04/2019 0546   CRP 0.9 04/03/2019 0645   CRP 2.3 (H) 04/02/2019 0400    CC time 33 minutes  Coralyn Helling, MD [facility] Pulmonary/Critical Care 04/14/2019, 9:14 AM

## 2019-04-14 NOTE — Progress Notes (Signed)
ETT flushed with 10 mL normal saline per MD order. Thick tan, green secretions with small plugs obtained.

## 2019-04-14 NOTE — Progress Notes (Signed)
Updated patient's wife via the phone. Apologized for delay in phone call. All questions answered.

## 2019-04-14 NOTE — Progress Notes (Signed)
Updated pt's wife over the phone.  Explained that he is essentially in a holding pattern until trach can be performed later this week.  Try to reassure her that Mr. Mackowski can still recover from this illness, but it will take time.  She appreciated this.  Chesley Mires, MD [facility] Pulmonary/Critical Care 04/14/2019, 3:27 PM

## 2019-04-15 ENCOUNTER — Inpatient Hospital Stay (HOSPITAL_COMMUNITY): Payer: PRIVATE HEALTH INSURANCE

## 2019-04-15 DIAGNOSIS — J151 Pneumonia due to Pseudomonas: Secondary | ICD-10-CM

## 2019-04-15 DIAGNOSIS — Z9911 Dependence on respirator [ventilator] status: Secondary | ICD-10-CM

## 2019-04-15 LAB — BASIC METABOLIC PANEL
Anion gap: 9 (ref 5–15)
Anion gap: 7 (ref 5–15)
BUN: 39 mg/dL — ABNORMAL HIGH (ref 6–20)
BUN: 37 mg/dL — ABNORMAL HIGH (ref 6–20)
CO2: 33 mmol/L — ABNORMAL HIGH (ref 22–32)
CO2: 32 mmol/L (ref 22–32)
Calcium: 8.6 mg/dL — ABNORMAL LOW (ref 8.9–10.3)
Calcium: 8.7 mg/dL — ABNORMAL LOW (ref 8.9–10.3)
Chloride: 93 mmol/L — ABNORMAL LOW (ref 98–111)
Chloride: 93 mmol/L — ABNORMAL LOW (ref 98–111)
Creatinine, Ser: 0.85 mg/dL (ref 0.61–1.24)
Creatinine, Ser: 0.88 mg/dL (ref 0.61–1.24)
GFR calc Af Amer: >60 mL/min (ref >60)
GFR calc Af Amer: >60 mL/min (ref >60)
GFR calc non Af Amer: >60 mL/min (ref >60)
GFR calc non Af Amer: >60 mL/min (ref >60)
Glucose, Bld: 102 mg/dL — ABNORMAL HIGH (ref 70–99)
Glucose, Bld: 122 mg/dL — ABNORMAL HIGH (ref 70–99)
Potassium: 5.1 mmol/L (ref 3.5–5.1)
Potassium: 4.8 mmol/L (ref 3.5–5.1)
Sodium: 135 mmol/L (ref 135–145)
Sodium: 132 mmol/L — ABNORMAL LOW (ref 135–145)

## 2019-04-15 LAB — CULTURE, RESPIRATORY W GRAM STAIN: Special Requests: NORMAL

## 2019-04-15 LAB — POCT I-STAT 7, (LYTES, BLD GAS, ICA,H+H)
Acid-Base Excess: 6 mmol/L — ABNORMAL HIGH (ref 0.0–2.0)
Bicarbonate: 33.2 mmol/L — ABNORMAL HIGH (ref 20.0–28.0)
Calcium, Ion: 1.21 mmol/L (ref 1.15–1.40)
HCT: 25 % — ABNORMAL LOW (ref 39.0–52.0)
Hemoglobin: 8.5 g/dL — ABNORMAL LOW (ref 13.0–17.0)
O2 Saturation: 82 %
Patient temperature: 38.6
Potassium: 4.8 mmol/L (ref 3.5–5.1)
Sodium: 131 mmol/L — ABNORMAL LOW (ref 135–145)
TCO2: 35 mmol/L — ABNORMAL HIGH (ref 22–32)
pCO2 arterial: 66 mmHg (ref 32.0–48.0)
pH, Arterial: 7.317 — ABNORMAL LOW (ref 7.350–7.450)
pO2, Arterial: 57 mmHg — ABNORMAL LOW (ref 83.0–108.0)

## 2019-04-15 LAB — C-REACTIVE PROTEIN: CRP: 17.7 mg/dL — ABNORMAL HIGH (ref <1.0)

## 2019-04-15 LAB — GLUCOSE, CAPILLARY
Glucose-Capillary: 97 mg/dL (ref 70–99)
Glucose-Capillary: 131 mg/dL — ABNORMAL HIGH (ref 70–99)
Glucose-Capillary: 112 mg/dL — ABNORMAL HIGH (ref 70–99)
Glucose-Capillary: 133 mg/dL — ABNORMAL HIGH (ref 70–99)
Glucose-Capillary: 141 mg/dL — ABNORMAL HIGH (ref 70–99)
Glucose-Capillary: 119 mg/dL — ABNORMAL HIGH (ref 70–99)

## 2019-04-15 LAB — CBC
HCT: 24 % — ABNORMAL LOW (ref 39.0–52.0)
Hemoglobin: 7.3 g/dL — ABNORMAL LOW (ref 13.0–17.0)
MCH: 27.9 pg (ref 26.0–34.0)
MCHC: 30.4 g/dL (ref 30.0–36.0)
MCV: 91.6 fL (ref 80.0–100.0)
Platelets: 281 10*3/uL (ref 150–400)
RBC: 2.62 MIL/uL — ABNORMAL LOW (ref 4.22–5.81)
RDW: 15.6 % — ABNORMAL HIGH (ref 11.5–15.5)
WBC: 22.2 10*3/uL — ABNORMAL HIGH (ref 4.0–10.5)
nRBC: 0 % (ref 0.0–0.2)

## 2019-04-15 LAB — PHOSPHORUS: Phosphorus: 4.5 mg/dL (ref 2.5–4.6)

## 2019-04-15 LAB — MAGNESIUM: Magnesium: 1.9 mg/dL (ref 1.7–2.4)

## 2019-04-15 LAB — TRIGLYCERIDES: Triglycerides: 91 mg/dL (ref <150)

## 2019-04-15 LAB — HEPARIN LEVEL (UNFRACTIONATED)
Heparin Unfractionated: 0.74 IU/mL — ABNORMAL HIGH (ref 0.30–0.70)
Heparin Unfractionated: 0.64 IU/mL (ref 0.30–0.70)

## 2019-04-15 LAB — APTT
aPTT: 25 seconds (ref 24–36)
aPTT: 51 seconds — ABNORMAL HIGH (ref 24–36)

## 2019-04-15 MED ORDER — FUROSEMIDE 10 MG/ML IJ SOLN
40.0000 mg | Freq: Three times a day (TID) | INTRAMUSCULAR | Status: DC
  Administered 2019-04-15 – 2019-04-16 (×3): 40 mg via INTRAVENOUS
  Filled 2019-04-15 (×3): qty 4

## 2019-04-15 MED ORDER — ASPIRIN 81 MG PO CHEW
81.0000 mg | CHEWABLE_TABLET | Freq: Every day | ORAL | Status: DC
  Administered 2019-04-16 – 2019-04-29 (×13): 81 mg
  Filled 2019-04-15 (×15): qty 1

## 2019-04-15 MED ORDER — CLONAZEPAM 0.5 MG PO TABS: 0.5000 mg | ORAL_TABLET | Freq: Two times a day (BID) | ORAL | Status: DC

## 2019-04-15 MED ORDER — HEPARIN (PORCINE) 25000 UT/250ML-% IV SOLN
1700.0000 [IU]/h | INTRAVENOUS | Status: DC
  Administered 2019-04-15: 16:00:00 1500 [IU]/h via INTRAVENOUS
  Filled 2019-04-15: qty 250

## 2019-04-15 MED ORDER — OXYCODONE HCL 5 MG PO TABS: 5.0000 mg | ORAL_TABLET | Freq: Four times a day (QID) | ORAL | Status: DC

## 2019-04-15 MED ORDER — SODIUM CHLORIDE 0.9 % IV SOLN
0.0000 ug/kg/min | INTRAVENOUS | Status: DC
  Administered 2019-04-16: 21:00:00 5 ug/kg/min via INTRAVENOUS
  Administered 2019-04-16: 4 ug/kg/min via INTRAVENOUS
  Administered 2019-04-16: 16:00:00 6 ug/kg/min via INTRAVENOUS
  Administered 2019-04-16: 1 ug/kg/min via INTRAVENOUS
  Administered 2019-04-17 – 2019-04-18 (×5): 6 ug/kg/min via INTRAVENOUS
  Filled 2019-04-15 (×13): qty 20

## 2019-04-15 MED ORDER — MIDAZOLAM 50MG/50ML (1MG/ML) PREMIX INFUSION
0.5000 mg/h | INTRAVENOUS | Status: DC
  Administered 2019-04-15: 21:00:00 2 mg/h via INTRAVENOUS
  Administered 2019-04-16 (×2): 5 mg/h via INTRAVENOUS
  Administered 2019-04-16: 4 mg/h via INTRAVENOUS
  Administered 2019-04-17 – 2019-04-19 (×6): 5 mg/h via INTRAVENOUS
  Administered 2019-04-20: 4 mg/h via INTRAVENOUS
  Filled 2019-04-15 (×12): qty 50

## 2019-04-15 MED ORDER — CISATRACURIUM BOLUS VIA INFUSION
0.1000 mg/kg | Freq: Once | INTRAVENOUS | Status: AC
  Administered 2019-04-16: 11.3 mg via INTRAVENOUS
  Filled 2019-04-15: qty 12

## 2019-04-15 MED ORDER — FAMOTIDINE 40 MG/5ML PO SUSR
20.0000 mg | Freq: Every day | ORAL | Status: DC
  Administered 2019-04-15 – 2019-04-26 (×12): 20 mg
  Filled 2019-04-15 (×12): qty 2.5

## 2019-04-15 MED ORDER — CIPROFLOXACIN IN D5W 400 MG/200ML IV SOLN
400.0000 mg | Freq: Three times a day (TID) | INTRAVENOUS | Status: AC
  Administered 2019-04-15 – 2019-04-22 (×21): 400 mg via INTRAVENOUS
  Filled 2019-04-15 (×21): qty 200

## 2019-04-15 MED ORDER — MIDAZOLAM HCL 2 MG/2ML IJ SOLN
2.0000 mg | Freq: Once | INTRAMUSCULAR | Status: AC
  Administered 2019-04-15: 18:00:00 2 mg via INTRAVENOUS

## 2019-04-15 MED ORDER — METOLAZONE 5 MG PO TABS
5.0000 mg | ORAL_TABLET | Freq: Once | ORAL | Status: AC
  Administered 2019-04-15: 17:00:00 5 mg via ORAL
  Filled 2019-04-15 (×2): qty 1

## 2019-04-15 MED ORDER — QUETIAPINE FUMARATE 25 MG PO TABS: 25.0000 mg | ORAL_TABLET | Freq: Every day | ORAL | Status: DC

## 2019-04-15 MED ORDER — ARTIFICIAL TEARS OPHTHALMIC OINT
1.0000 "application " | TOPICAL_OINTMENT | Freq: Three times a day (TID) | OPHTHALMIC | Status: DC
  Administered 2019-04-16 – 2019-04-18 (×8): 1 via OPHTHALMIC
  Filled 2019-04-15: qty 3.5

## 2019-04-15 MED ORDER — CISATRACURIUM BESYLATE (PF) 10 MG/5ML IV SOLN
0.1000 mg/kg | Freq: Once | INTRAVENOUS | Status: AC
  Administered 2019-04-15: 21:00:00 11.4 mg via INTRAVENOUS
  Filled 2019-04-15: qty 5.7

## 2019-04-15 MED ORDER — MIDAZOLAM HCL 2 MG/2ML IJ SOLN
2.0000 mg | INTRAMUSCULAR | Status: DC | PRN
  Administered 2019-04-15 – 2019-04-18 (×6): 2 mg via INTRAVENOUS
  Filled 2019-04-15 (×4): qty 2

## 2019-04-15 MED ORDER — MIDAZOLAM HCL 2 MG/2ML IJ SOLN
2.0000 mg | INTRAMUSCULAR | Status: AC | PRN
  Administered 2019-04-15 (×3): 2 mg via INTRAVENOUS
  Filled 2019-04-15 (×3): qty 2

## 2019-04-15 MED ORDER — DEXMEDETOMIDINE HCL IN NACL 400 MCG/100ML IV SOLN
0.4000 ug/kg/h | INTRAVENOUS | Status: DC
  Administered 2019-04-15: 1.2 ug/kg/h via INTRAVENOUS
  Administered 2019-04-15: 0.4 ug/kg/h via INTRAVENOUS
  Administered 2019-04-16: 0.8 ug/kg/h via INTRAVENOUS
  Administered 2019-04-18 – 2019-04-20 (×6): 0.4 ug/kg/h via INTRAVENOUS
  Administered 2019-04-20 (×2): 0.7 ug/kg/h via INTRAVENOUS
  Administered 2019-04-21 (×5): 0.8 ug/kg/h via INTRAVENOUS
  Administered 2019-04-22: 0.7 ug/kg/h via INTRAVENOUS
  Administered 2019-04-22: 07:00:00 0.6 ug/kg/h via INTRAVENOUS
  Administered 2019-04-22: 0.4 ug/kg/h via INTRAVENOUS
  Administered 2019-04-22: 0.5 ug/kg/h via INTRAVENOUS
  Filled 2019-04-15 (×22): qty 100

## 2019-04-15 MED ORDER — OXYCODONE HCL 5 MG PO TABS
5.0000 mg | ORAL_TABLET | Freq: Four times a day (QID) | ORAL | Status: DC
  Administered 2019-04-15 – 2019-04-19 (×16): 5 mg
  Filled 2019-04-15 (×16): qty 1

## 2019-04-15 NOTE — Progress Notes (Signed)
Pharmacy Antibiotic Note  Lynden Flemmer is a 58 y.o. male admitted on 03/07/2019 with COVID-19 pneumonia, and developed Pseudomonas and MSSA pneumonia.  He has completed Cefepime x10d.  Pharmacy has been consulted for Cipro dosing. WBC remain elevated at 22, Tm 101.1, SCr < 1. Potential for DDI w/amiodarone.  QTc 390 on tele.  Plan: Cipro 400mg  IV q8h Follow up renal function, culture results, and clinical course.  Height: 5\' 11"  (180.3 cm) Weight: 249 lb 1.9 oz (113 kg) IBW/kg (Calculated) : 75.3  Temp (24hrs), Avg:99.5 F (37.5 C), Min:97.3 F (36.3 C), Max:101.1 F (38.4 C)  Recent Labs  Lab 04/11/19 0630 04/12/19 0605 04/13/19 0055 04/14/19 0545 04/15/19 0550  WBC 11.7* 17.1* 29.1* 24.6* 22.2*  CREATININE 0.86 0.79 0.99 0.69 0.85    Estimated Creatinine Clearance: 119.6 mL/min (by C-G formula based on SCr of 0.85 mg/dL).    Allergies  Allergen Reactions  . Clindamycin Hcl Other (See Comments)    Other reaction(s): Other (See Comments) Other reaction(s): ANAPHYLAXIS Other reaction(s): ANAPHYLAXIS Other reaction(s): Other (See Comments) Other reaction(s): ANAPHYLAXIS Other reaction(s): ANAPHYLAXIS Other reaction(s): ANAPHYLAXIS   . 5-Alpha Reductase Inhibitors   . Semaglutide Nausea And Vomiting  . Hydromorphone Nausea And Vomiting    Per patient Per patient Per patient     Antimicrobials this admission: 10/27 remdesivir >> 10/30 10/30 Vanc >> 11/1 10/30 Zosyn >> 11/2 11/2 Ceftazidime >> 11/5 11/7 Cefepime >> (11/16) 11/14 Vanc >> 11/16 11/16 Cipro >>   Microbiology results: 10/26 MRSA PCR: negative 10/29 BCx ng x5 days (final)  10/30 exp sputum: moderate Pseudomonas aeruginosa  (pan-sens), moderate MSSA 11/7 Tracheal asp: moderate pseudomonas (likely colonizer?, pan-sens) 11/14 Resp Cx: moderate pseudomonas (likely colonizer?, sens to cipro, gent, imipenem)  Thank you for allowing pharmacy to be a part of this patient's  care.  Gretta Arab PharmD, BCPS Clinical pharmacist phone 7am- 5pm: [phone] 04/15/2019 2:18 PM

## 2019-04-15 NOTE — Progress Notes (Signed)
Patient desaturated in mid 70's with no activity. Tidal volume 200-261ml.  Bagged and lavaged patient with ease. Small thin tan pink tinged secretions. Spo2 immediately increased to high 90's. Placed back on vent. Changed to PRVC Vt 550, R-28, Fio2 80% Peep 8. spo2 currently 95%.  Chest Xray ordered and obtain ABG at 22:30.

## 2019-04-15 NOTE — Progress Notes (Signed)
NAME:  Angel Stuart, MRN:  [id number], DOB:  Jan 25, 1961, LOS: 21 ADMISSION DATE:  2019-04-09, CONSULTATION DATE:  10/26 REFERRING MD:  Sharon Seller, CHIEF COMPLAINT:  Dyspnea   Brief History   58 yo male developed respiratory symptoms 10/05, and tested positive for COVID 19 on 10/09.  Presented to [facility] ER 10/25 with progressive dyspnea.  Found to have hypoxia and b/l pulmonary infiltrates, required intubation and transferred to [facility].  Past Medical History  HTN, HLD, DM  Significant Hospital Events   10/26 admission to ICU, prone position 10/29 fever 11/02 worsening oxygenation, persistent fever >> change ABx 11/05 d/c ABx 11/06 atrial fib with RVR 11/07 recurrent fever, change ABx 11/12 start pressure support weaning 1114 Fever 103.1, adjust ABx for HCAP  Consults:    Procedures:  10/26 ETT >  10/26 L subclavian CVL > 11/4  Significant Diagnostic Tests:  Doppler legs b/l 11/01 >> no DVT Echo 11/05 >> EF 60 to 65%, grade 1 DD, mild LVH  Micro Data:  Blood 10/29 >> negative Sputum 10/30 >> MSSA, Pseudomonas Sputum 11/07 >> Pseudomonas, pansensitive  Sputum 11/14 >> Pseudomonas, now with resistance   COVID Therapy:  Remdesivir 10/26 >> 10/30 Decadron 10/26 >> 11/04 Convalescent plasma 10/26  Antibiotics:  Zosyn 10/30 >>11/02 Vancomycin 10/30 >> 10/31 Fortaz 11/02 >> 11/05 Cefepime 11/07 >>  Vancomycin 11/14 >>   Interim history/subjective:   Remains intubated on mechanical life support  Objective   Blood pressure (!) 102/55, pulse (!) 101, temperature 100 F (37.8 C), resp. rate (!) 28, height 5\' 11"  (1.803 m), weight 113 kg, SpO2 96 %.    Vent Mode: PCV FiO2 (%):  [40 %-50 %] 50 % Set Rate:  [28 bmp-30 bmp] 28 bmp Vt Set:  [520 mL] 520 mL PEEP:  [5 cmH20] 5 cmH20 Plateau Pressure:  [21 cmH20] 21 cmH20   Intake/Output Summary (Last 24 hours) at 04/15/2019 0802 Last data filed at 04/15/2019 0400 Gross per 24 hour  Intake 1974.19 ml  Output  975 ml  Net 999.19 ml   Filed Weights   04/13/19 0500 04/14/19 0500 04/15/19 04/17/19  Weight: 112.3 kg 111.4 kg 113 kg    Examination:  General -sedated on mechanical ventilation, critically ill Eyes -pupils are reactive, ENT -endotracheal tube in place Cardiac -regular rate and rhythm, S1-S2 Chest -bilateral ventilated breath sounds, rhonchorous Abdomen -soft, nontender, nondistended, bowel sounds present Extremities -no cyanosis, dependent upper and lower extremity edema Skin -no significant rash Neuro -moves limbs spontaneously   Resolved Hospital Problem list   Septic shock, MSSA HCAP, Hypernatremia  Assessment & Plan:   Acute hypoxic, hypercapnic respiratory failure with ARDS from COVID 19 pneumonia. -Patient has completed course of remdesivir, Decadron plus convalescent plasma -Mental status and secretions are barriers to extubation at this time -Patient was able to tolerate brief period and pressure support this morning. -Wean FiO2 to maintain SPO2 greater than 88% -Chest x-ray reviewed: Worsening of bilateral infiltrates  Pseudomonal HCAP, ventilator associated pneumonia -Most recent sensitivities, intermediate cefepime, resistant to ceftazidime -Infectious disease consulted, we appreciate the recommendations -Presumed consideration for starting Carbapenem  Positive cumulative fluid balance -Diuresis started with Lasix 40 mg every 8 -One-time dose of Zaroxolyn  New onset A fib with RVR. Hx of HTN, HLD. -Continue amiodarone, aspirin, Crestor, Eliquis -Eliquis held, heparin drip started, consult placed to pharmacy. -Potential need for tracheostomy.  Hyperkalemia, metabolic alkalosis. -Continue diuresis -If needed can mitigate alkalosis with acetazolamide  Acute metabolic encephalopathy. -RASS goal 0 to -  1 -Start trying to decrease some of the sedating medications to help with liberation from vent  Anemia of critical illness. -Follow CBC, conservative  transfusion threshold  Moderate protein calorie malnutrition. -Continue tube feeds  DM type II. -SI with Lantus  Best practice:  Diet: tube feeds DVT prophylaxis: Eliquis would being held due to plans for potential trach. GI prophylaxis: protonix Mobility: bed rest Code Status: Full Family: I called updated his wife [name]  Disposition: remain in ICU  Labs    CMP Latest Ref Rng & Units 04/15/2019 04/14/2019 04/14/2019  Glucose 70 - 99 mg/dL 102(H) - 95  BUN 6 - 20 mg/dL 39(H) - 37(H)  Creatinine 0.61 - 1.24 mg/dL 0.85 - 0.69  Sodium 135 - 145 mmol/L 135 134(L) 138  Potassium 3.5 - 5.1 mmol/L 5.1 4.8 5.1  Chloride 98 - 111 mmol/L 93(L) - 94(L)  CO2 22 - 32 mmol/L 33(H) - 34(H)  Calcium 8.9 - 10.3 mg/dL 8.6(L) - 8.8(L)  Total Protein 6.5 - 8.1 g/dL - - -  Total Bilirubin 0.3 - 1.2 mg/dL - - -  Alkaline Phos 38 - 126 U/L - - -  AST 15 - 41 U/L - - -  ALT 0 - 44 U/L - - -    CBC Latest Ref Rng & Units 04/15/2019 04/14/2019 04/14/2019  WBC 4.0 - 10.5 K/uL 22.2(H) - 24.6(H)  Hemoglobin 13.0 - 17.0 g/dL 7.3(L) 7.5(L) 8.7(L)  Hematocrit 39.0 - 52.0 % 24.0(L) 22.0(L) 28.4(L)  Platelets 150 - 400 K/uL 281 - 295    ABG    Component Value Date/Time   PHART 7.421 04/14/2019 1121   PCO2ART 58.4 (H) 04/14/2019 1121   PO2ART 57.0 (L) 04/14/2019 1121   HCO3 38.0 (H) 04/14/2019 1121   TCO2 40 (H) 04/14/2019 1121   ACIDBASEDEF 3.0 (H) 03/08/2019 1442   O2SAT 89.0 04/14/2019 1121    CBG (last 3)  Recent Labs    04/14/19 1949 04/14/19 2343 04/15/19 0401  GLUCAP 137* 143* 97    This patient is critically ill with multiple organ system failure; which, requires frequent high complexity decision making, assessment, support, evaluation, and titration of therapies. This was completed through the application of advanced monitoring technologies and extensive interpretation of multiple databases. During this encounter critical care time was devoted to patient care services described  in this note for 42 minutes.  Garner Nash, DO [facility] 04/15/2019 8:02 AM

## 2019-04-15 NOTE — Progress Notes (Signed)
Call placed to pt family 26, no answer.

## 2019-04-15 NOTE — Progress Notes (Signed)
Cooling blanket restarted overnight for climbing temperature.  Tylenol 650 mg x2.  Rectal temp probe in place.  Thick tan secretions. See flowsheet for assessment details and MAR for medication adminstration.  Will continue to monitor.

## 2019-04-15 NOTE — Progress Notes (Signed)
ANTICOAGULATION CONSULT NOTE - Follow Up Consult  Pharmacy Consult for Heaprin Indication: atrial fibrillation  Allergies  Allergen Reactions  . Clindamycin Hcl Other (See Comments)    Other reaction(s): Other (See Comments) Other reaction(s): ANAPHYLAXIS Other reaction(s): ANAPHYLAXIS Other reaction(s): Other (See Comments) Other reaction(s): ANAPHYLAXIS Other reaction(s): ANAPHYLAXIS Other reaction(s): ANAPHYLAXIS   . 5-Alpha Reductase Inhibitors   . Semaglutide Nausea And Vomiting  . Hydromorphone Nausea And Vomiting    Per patient Per patient Per patient     Patient Measurements: Height: 5\' 11"  (180.3 cm) Weight: 249 lb 1.9 oz (113 kg) IBW/kg (Calculated) : 75.3 Heparin Dosing Weight: 100kg  Vital Signs: Temp: 100.4 F (38 C) (11/16 1000) Temp Source: Rectal (11/16 0400) BP: 107/64 (11/16 1300) Pulse Rate: 95 (11/16 1300)  Labs: Recent Labs    04/13/19 0055 04/14/19 0545 04/14/19 1121 04/15/19 0550  HGB 9.1* 8.7* 7.5* 7.3*  HCT 30.0* 28.4* 22.0* 24.0*  PLT 294 295  --  281  CREATININE 0.99 0.69  --  0.85    Estimated Creatinine Clearance: 119.6 mL/min (by C-G formula based on SCr of 0.85 mg/dL).   Medications:  Scheduled:  . amiodarone  200 mg Per Tube Daily  . aspirin EC  81 mg Oral Daily  . chlorhexidine  15 mL Mouth/Throat BID  . Chlorhexidine Gluconate Cloth  6 each Topical Daily  . famotidine  20 mg Oral QHS  . feeding supplement (PRO-STAT SUGAR FREE 64)  30 mL Per Tube QID  . free water  200 mL Per Tube Q8H  . furosemide  40 mg Intravenous Q8H  . insulin aspart  0-20 Units Subcutaneous Q4H  . insulin aspart  14 Units Subcutaneous Q4H  . insulin glargine  50 Units Subcutaneous BID  . mouth rinse  15 mL Mouth Rinse 10 times per day  . metolazone  5 mg Oral Once  . oxyCODONE  5 mg Oral Q6H  . polyethylene glycol  17 g Per Tube BID  . rosuvastatin  20 mg Per Tube QHS  . sodium chloride HYPERTONIC  4 mL Nebulization BID  . tamsulosin  0.4  mg Oral Daily  . zinc sulfate  220 mg Per Tube Daily   Infusions:  . sodium chloride 10 mL/hr at 04/13/19 0800  . ceFEPime (MAXIPIME) IV 2 g (04/15/19 0558)  . dexmedetomidine (PRECEDEX) IV infusion    . feeding supplement (VITAL 1.5 CAL) 65 mL/hr at 04/15/19 0200  . HYDROmorphone 3.5 mg/hr (04/15/19 1038)    Assessment: 58 yo male admitted 02/28/2019 with COVID-19 pneumonia requiring mechanical ventilation.  He was receiving VTE prophylaxis with lovenox 0.5mg /kg SQ q12h, LE dopplers negative on 11/1 and 11/8. Patient then developed Afib, was on heparin, then transitioned to apixaban.  Apixaban was held for possible tracheostomy procedure this week.  Pharmacy is consulted to dose Heparin IV.  Last apixaban 5 mg BID dose - 11/15 at ~0830 Baseline APTT and HL in process. CBC:  Hgb is low and decreased to 7.3, Plt stable/wnl at 281k. No bleeding or complications reported.  Goal of Therapy:  Heparin level 0.3-0.7 units/ml aPTT 66-102 seconds Monitor platelets by anticoagulation protocol: Yes   Plan:   Baseline APTT, Heparin level  Start heparin IV infusion at 1500 units/hr  APTT and Heparin level 6 hours after starting  Daily heparin level and CBC   Gretta Arab PharmD, BCPS Clinical pharmacist phone 7am- 5pm: [phone] 04/15/2019 2:00 PM

## 2019-04-15 NOTE — Progress Notes (Signed)
[place] Progress Note Patient Name: Angel Stuart DOB: 1961-01-21 MRN: [id number]   Date of Service  04/15/2019  HPI/Events of Note  Pt desaturated following paralytic, low tidal volumes, etiology unclear.  eICU Interventions  Pt switched to  PRVC with Vt of 550 after initially bagging him, saturation back up to the high 90's.        [name] 04/15/2019, 9:19 PM

## 2019-04-15 NOTE — Consult Note (Addendum)
Date of Admission:  03/18/2019          Reason for Consult: Progressively R pseudomonas aeruginosa pneumonia   Referring Provider: Dr. Craige CottaSood   Assessment:  1. Progressively R pseudomonas aeruginosa pneumonia in patient with  2. COVID 19 infection sp remdesivir, steroids, on day #20 of mechanical ventilation 3. DM  Plan:  1. DC cefepime and change to ciprofloxacin and treat for 7 days provided he is improving 2. Continue supportive measures would   Active Problems:   Pneumonia due to COVID-19 virus   Acute respiratory failure with hypoxemia (HCC)   Encounter for nasogastric (NG) tube placement   Acute pulmonary edema (HCC)   Acute encephalopathy   Acute respiratory distress syndrome (ARDS) due to COVID-19 virus (HCC)   AKI (acute kidney injury) (HCC)   Pressure injury of skin   Scheduled Meds:  amiodarone  200 mg Per Tube Daily   aspirin EC  81 mg Oral Daily   chlorhexidine  15 mL Mouth/Throat BID   Chlorhexidine Gluconate Cloth  6 each Topical Daily   famotidine  20 mg Oral QHS   feeding supplement (PRO-STAT SUGAR FREE 64)  30 mL Per Tube QID   free water  200 mL Per Tube Q8H   furosemide  40 mg Intravenous Q8H   insulin aspart  0-20 Units Subcutaneous Q4H   insulin aspart  14 Units Subcutaneous Q4H   insulin glargine  50 Units Subcutaneous BID   mouth rinse  15 mL Mouth Rinse 10 times per day   metolazone  5 mg Oral Once   oxyCODONE  5 mg Oral Q6H   polyethylene glycol  17 g Per Tube BID   rosuvastatin  20 mg Per Tube QHS   sodium chloride HYPERTONIC  4 mL Nebulization BID   tamsulosin  0.4 mg Oral Daily   zinc sulfate  220 mg Per Tube Daily   Continuous Infusions:  sodium chloride 10 mL/hr at 04/13/19 0800   dexmedetomidine (PRECEDEX) IV infusion     feeding supplement (VITAL 1.5 CAL) 65 mL/hr at 04/15/19 0200   HYDROmorphone 3.5 mg/hr (04/15/19 1038)   PRN Meds:.acetaminophen (TYLENOL) oral liquid 160 mg/5 mL, bisacodyl,  chlorpheniramine-HYDROcodone, guaiFENesin, HYDROmorphone, metoprolol tartrate, midazolam, midazolam, sennosides  HPI: Angel Stuart is a 58 y.o. male with DM who began to develop upper respiratory symptoms on 10/5, and ultimately tested positive for Covid on 10/9.  Records suggest he experienced a gradual progression of symptoms with acute worsening of shortness of breath on 10/25, which prompted [facility].  In the [facility] he was noted to have a saturation of 63% on room air.  Chest x-ray noted severe bilateral infiltrates.  He rapidly decompensated in the emergency room.  He failed a trial of BiPAP and had to be intubated.  [facility] was subsequently [facility] and has developed ARDS.  He has received remdesivir,decadron and convalsecent plasma. He has developed HCAP with fevers and worsening infiltrates. He has been treated with :   Cefepime 11/07 >>  Vancomycin 11/14 >>   Sputum  Cultures from 10/30 grew moderate Ps. Aeruginosa (that was S species) along with MSSA,  He received  Zosyn 10/30 >>11/02 Vancomycin 10/30 >> 10/31  Fortaz 11/02 >> 11/05  then tracheal culture on 04/06/2019 with Ps. Aeruginosa S to all abx yet again when he had worsening resp status and fevers and  And he was placed on cefepime.  ON 04/12/2019  he has had worsening fevers to 103 with worsening secretions and diffuse airspace disease. His repeat cultures are now growing  Pseudomonas   CEFEPIME 16 INTERMED... Intermediate     CEFTAZIDIME 32 RESISTANT  Resistant    CIPROFLOXACIN <=0.25 SENS... Sensitive    GENTAMICIN <=1 SENSITIVE  Sensitive    IMIPENEM 2 SENSITIVE  Sensitive    Imipenem S is 1 dilution away from being I range  I would change him to ciprofloxacin since it is still fully active.  This consult was performed remotely.   Review of Systems: Review of Systems  Unable to perform ROS: Critical illness    Past Medical History:  Diagnosis Date    Diabetes (HCC)    Neuropathy     Social History   Tobacco Use   Smoking status: Never Smoker   Smokeless tobacco: Never Used  Substance Use Topics   Alcohol use: Not Currently   Drug use: Never    Family History  Problem Relation Age of Onset   Diabetes Father    Allergies  Allergen Reactions   Clindamycin Hcl Other (See Comments)    Other reaction(s): Other (See Comments) Other reaction(s): ANAPHYLAXIS Other reaction(s): ANAPHYLAXIS Other reaction(s): Other (See Comments) Other reaction(s): ANAPHYLAXIS Other reaction(s): ANAPHYLAXIS Other reaction(s): ANAPHYLAXIS    5-Alpha Reductase Inhibitors    Semaglutide Nausea And Vomiting   Hydromorphone Nausea And Vomiting    Per patient Per patient Per patient     OBJECTIVE: Blood pressure 107/64, pulse 95, temperature (!) 100.4 F (38 C), resp. rate (!) 32, height 5\' 11"  (1.803 m), weight 113 kg, SpO2 91 %.  Physical Exam Vitals signs reviewed.    Vitals with BMI 04/15/2019 04/15/2019 04/15/2019  Height - - -  Weight - - -  BMI - - -  Systolic 107 112 04/17/2019  Diastolic 64 63 64  Pulse 95 96 98       Lab Results Lab Results  Component Value Date   WBC 22.2 (H) 04/15/2019   HGB 7.3 (L) 04/15/2019   HCT 24.0 (L) 04/15/2019   MCV 91.6 04/15/2019   PLT 281 04/15/2019    Lab Results  Component Value Date   CREATININE 0.85 04/15/2019   BUN 39 (H) 04/15/2019   NA 135 04/15/2019   K 5.1 04/15/2019   CL 93 (L) 04/15/2019   CO2 33 (H) 04/15/2019    Lab Results  Component Value Date   ALT 28 04/12/2019   AST 27 04/12/2019   ALKPHOS 88 04/12/2019   BILITOT 0.4 04/12/2019     Microbiology: Recent Results (from the past 240 hour(s))  Culture, respiratory (non-expectorated)     Status: None   Collection Time: 04/06/19  9:55 AM   Specimen: Tracheal Aspirate; Respiratory  Result Value Ref Range Status   Specimen Description   Final    TRACHEAL ASPIRATE Performed at [facility], [address], [place], [place]    Special Requests NONE  Final   Gram Stain   Final    RARE WBC PRESENT, PREDOMINANTLY PMN FEW GRAM NEGATIVE RODS Performed at [facility], [address], [place], [place]    Culture MODERATE PSEUDOMONAS AERUGINOSA  Final   Report Status 04/09/2019 FINAL  Final   Organism ID, Bacteria PSEUDOMONAS AERUGINOSA  Final      Susceptibility   Pseudomonas aeruginosa - MIC*    CEFTAZIDIME 4 SENSITIVE Sensitive     CIPROFLOXACIN <=0.25 SENSITIVE Sensitive  GENTAMICIN 2 SENSITIVE Sensitive     IMIPENEM 2 SENSITIVE Sensitive     PIP/TAZO 8 SENSITIVE Sensitive     CEFEPIME 4 SENSITIVE Sensitive     * MODERATE PSEUDOMONAS AERUGINOSA  Culture, respiratory (non-expectorated)     Status: None   Collection Time: 04/13/19  8:11 AM   Specimen: Tracheal Aspirate; Respiratory  Result Value Ref Range Status   Specimen Description   Final    TRACHEAL ASPIRATE Performed at [place] [facility] [address] [place], [place] [address]    Special Requests   Final    Normal Performed at [facility], [place] [address] [place], [place] [address]    Gram Stain   Final    ABUNDANT WBC PRESENT, PREDOMINANTLY PMN RARE GRAM POSITIVE RODS Performed at [facility], [place] [address], [place], [place] [address]    Culture MODERATE PSEUDOMONAS AERUGINOSA  Final   Report Status 04/15/2019 FINAL  Final   Organism ID, Bacteria PSEUDOMONAS AERUGINOSA  Final      Susceptibility   Pseudomonas aeruginosa - MIC*    CEFTAZIDIME 32 RESISTANT Resistant     CIPROFLOXACIN <=0.25 SENSITIVE Sensitive     GENTAMICIN <=1 SENSITIVE Sensitive     IMIPENEM 2 SENSITIVE Sensitive     CEFEPIME 16 INTERMEDIATE Intermediate     * MODERATE PSEUDOMONAS AERUGINOSA    Alcide Evener, [facility] [phone] pager  04/15/2019, 2:00 PM

## 2019-04-15 NOTE — Progress Notes (Signed)
Critical ABG results called to [place]. Changes made.  Increased FIO2 to 90%, peep to 10, Rate to 32. Repeat ABG in a.m.

## 2019-04-15 NOTE — Progress Notes (Signed)
ETT flushed with 10 mL normal saline per MD order.  Moderate amount of pink/tan thick secretions with small mucus plugs obtained.

## 2019-04-16 ENCOUNTER — Inpatient Hospital Stay (HOSPITAL_COMMUNITY): Payer: PRIVATE HEALTH INSURANCE

## 2019-04-16 DIAGNOSIS — Z930 Tracheostomy status: Secondary | ICD-10-CM

## 2019-04-16 LAB — POCT I-STAT 7, (LYTES, BLD GAS, ICA,H+H)
Acid-Base Excess: 5 mmol/L — ABNORMAL HIGH (ref 0.0–2.0)
Acid-Base Excess: 5 mmol/L — ABNORMAL HIGH (ref 0.0–2.0)
Bicarbonate: 33.4 mmol/L — ABNORMAL HIGH (ref 20.0–28.0)
Bicarbonate: 31.8 mmol/L — ABNORMAL HIGH (ref 20.0–28.0)
Calcium, Ion: 1.21 mmol/L (ref 1.15–1.40)
Calcium, Ion: 1.18 mmol/L (ref 1.15–1.40)
HCT: 24 % — ABNORMAL LOW (ref 39.0–52.0)
HCT: 24 % — ABNORMAL LOW (ref 39.0–52.0)
Hemoglobin: 8.2 g/dL — ABNORMAL LOW (ref 13.0–17.0)
Hemoglobin: 8.2 g/dL — ABNORMAL LOW (ref 13.0–17.0)
O2 Saturation: 99 %
O2 Saturation: 96 %
Patient temperature: 37
Patient temperature: 37
Potassium: 5.9 mmol/L — ABNORMAL HIGH (ref 3.5–5.1)
Potassium: 5.8 mmol/L — ABNORMAL HIGH (ref 3.5–5.1)
Sodium: 130 mmol/L — ABNORMAL LOW (ref 135–145)
Sodium: 129 mmol/L — ABNORMAL LOW (ref 135–145)
TCO2: 36 mmol/L — ABNORMAL HIGH (ref 22–32)
TCO2: 33 mmol/L — ABNORMAL HIGH (ref 22–32)
pCO2 arterial: 71.9 mmHg (ref 32.0–48.0)
pCO2 arterial: 57.2 mmHg — ABNORMAL HIGH (ref 32.0–48.0)
pH, Arterial: 7.274 — ABNORMAL LOW (ref 7.350–7.450)
pH, Arterial: 7.353 (ref 7.350–7.450)
pO2, Arterial: 145 mmHg — ABNORMAL HIGH (ref 83.0–108.0)
pO2, Arterial: 90 mmHg (ref 83.0–108.0)

## 2019-04-16 LAB — BASIC METABOLIC PANEL
Anion gap: 11 (ref 5–15)
Anion gap: 11 (ref 5–15)
BUN: 52 mg/dL — ABNORMAL HIGH (ref 6–20)
BUN: 52 mg/dL — ABNORMAL HIGH (ref 6–20)
CO2: 29 mmol/L (ref 22–32)
CO2: 32 mmol/L (ref 22–32)
Calcium: 8.7 mg/dL — ABNORMAL LOW (ref 8.9–10.3)
Calcium: 8.6 mg/dL — ABNORMAL LOW (ref 8.9–10.3)
Chloride: 92 mmol/L — ABNORMAL LOW (ref 98–111)
Chloride: 92 mmol/L — ABNORMAL LOW (ref 98–111)
Creatinine, Ser: 1.24 mg/dL (ref 0.61–1.24)
Creatinine, Ser: 1.21 mg/dL (ref 0.61–1.24)
GFR calc Af Amer: >60 mL/min (ref >60)
GFR calc Af Amer: >60 mL/min (ref >60)
GFR calc non Af Amer: >60 mL/min (ref >60)
GFR calc non Af Amer: >60 mL/min (ref >60)
Glucose, Bld: 221 mg/dL — ABNORMAL HIGH (ref 70–99)
Glucose, Bld: 139 mg/dL — ABNORMAL HIGH (ref 70–99)
Potassium: 6.2 mmol/L — ABNORMAL HIGH (ref 3.5–5.1)
Potassium: 4.5 mmol/L (ref 3.5–5.1)
Sodium: 132 mmol/L — ABNORMAL LOW (ref 135–145)
Sodium: 135 mmol/L (ref 135–145)

## 2019-04-16 LAB — MAGNESIUM
Magnesium: 1.9 mg/dL (ref 1.7–2.4)
Magnesium: 1.8 mg/dL (ref 1.7–2.4)

## 2019-04-16 LAB — GLUCOSE, CAPILLARY
Glucose-Capillary: 166 mg/dL — ABNORMAL HIGH (ref 70–99)
Glucose-Capillary: 221 mg/dL — ABNORMAL HIGH (ref 70–99)
Glucose-Capillary: 117 mg/dL — ABNORMAL HIGH (ref 70–99)
Glucose-Capillary: 68 mg/dL — ABNORMAL LOW (ref 70–99)
Glucose-Capillary: 93 mg/dL (ref 70–99)
Glucose-Capillary: 138 mg/dL — ABNORMAL HIGH (ref 70–99)
Glucose-Capillary: 98 mg/dL (ref 70–99)
Glucose-Capillary: 235 mg/dL — ABNORMAL HIGH (ref 70–99)

## 2019-04-16 LAB — TRIGLYCERIDES: Triglycerides: 66 mg/dL (ref <150)

## 2019-04-16 LAB — HEPARIN LEVEL (UNFRACTIONATED): Heparin Unfractionated: 0.64 IU/mL (ref 0.30–0.70)

## 2019-04-16 LAB — APTT: aPTT: 59 seconds — ABNORMAL HIGH (ref 24–36)

## 2019-04-16 LAB — PHOSPHORUS
Phosphorus: 6 mg/dL — ABNORMAL HIGH (ref 2.5–4.6)
Phosphorus: 6.7 mg/dL — ABNORMAL HIGH (ref 2.5–4.6)

## 2019-04-16 MED ORDER — PHENYLEPHRINE HCL-NACL 10-0.9 MG/250ML-% IV SOLN
0.0000 ug/min | INTRAVENOUS | Status: DC
  Administered 2019-04-16 (×2): 40 ug/min via INTRAVENOUS
  Administered 2019-04-16: 05:00:00 25 ug/min via INTRAVENOUS
  Administered 2019-04-17: 20 ug/min via INTRAVENOUS
  Administered 2019-04-17: 25 ug/min via INTRAVENOUS
  Filled 2019-04-16 (×6): qty 250

## 2019-04-16 MED ORDER — FUROSEMIDE 10 MG/ML IJ SOLN
40.0000 mg | Freq: Four times a day (QID) | INTRAMUSCULAR | Status: AC
  Administered 2019-04-16 (×3): 40 mg via INTRAVENOUS
  Filled 2019-04-16 (×3): qty 4

## 2019-04-16 MED ORDER — HEPARIN (PORCINE) 25000 UT/250ML-% IV SOLN
1700.0000 [IU]/h | INTRAVENOUS | Status: DC
  Filled 2019-04-16: qty 250

## 2019-04-16 MED ORDER — METOLAZONE 10 MG PO TABS
10.0000 mg | ORAL_TABLET | Freq: Once | ORAL | Status: AC
  Administered 2019-04-16: 10 mg via ORAL
  Filled 2019-04-16: qty 1

## 2019-04-16 NOTE — Progress Notes (Signed)
RT Note:ETT flushed with 73ml saline per MD. Thick tan pink tinged secretions obtained

## 2019-04-16 NOTE — Progress Notes (Signed)
   04/16/19 0100  Vitals  Temp (!) 102.4 F (39.1 C)  tylenol administered per order

## 2019-04-16 NOTE — Progress Notes (Signed)
Pt's wife [name] called and given update on her husband's condition, including his newly placed tracheostomy and his central line. All questions answered and she thanked [place] for all the care he is receiving here at [facility].

## 2019-04-16 NOTE — Progress Notes (Signed)
ANTICOAGULATION CONSULT NOTE - Follow Up Consult  Pharmacy Consult for Heaprin Indication: atrial fibrillation  Allergies  Allergen Reactions  . Clindamycin Hcl Other (See Comments)    Other reaction(s): Other (See Comments) Other reaction(s): ANAPHYLAXIS Other reaction(s): ANAPHYLAXIS Other reaction(s): Other (See Comments) Other reaction(s): ANAPHYLAXIS Other reaction(s): ANAPHYLAXIS Other reaction(s): ANAPHYLAXIS   . 5-Alpha Reductase Inhibitors   . Semaglutide Nausea And Vomiting  . Hydromorphone Nausea And Vomiting    Per patient Per patient Per patient     Patient Measurements: Height: 5\' 11"  (180.3 cm) Weight: 253 lb 8.5 oz (115 kg) IBW/kg (Calculated) : 75.3 Heparin Dosing Weight: 100kg  Vital Signs: Temp: 98.6 F (37 C) (11/17 0600) BP: 79/59 (11/17 0615) Pulse Rate: 69 (11/17 0615)  Labs: Recent Labs    04/14/19 0545  04/15/19 0550 04/15/19 1458 04/15/19 2239 04/15/19 2300 04/16/19 0450 04/16/19 0601  HGB 8.7*   < > 7.3*  --  8.5*  --  8.2* 8.2*  HCT 28.4*   < > 24.0*  --  25.0*  --  24.0* 24.0*  PLT 295  --  281  --   --   --   --   --   APTT  --   --   --  25  --  51*  --   --   HEPARINUNFRC  --   --   --  0.74*  --  0.64  --   --   CREATININE 0.69  --  0.85 0.88  --   --   --   --    < > = values in this interval not displayed.    Estimated Creatinine Clearance: 116.6 mL/min (by C-G formula based on SCr of 0.88 mg/dL).   Medications:  Infusions:  . sodium chloride 10 mL/hr at 04/13/19 0800  . ciprofloxacin Stopped (04/16/19 0617)  . cisatracurium (NIMBEX) infusion 4 mcg/kg/min (04/16/19 0630)  . dexmedetomidine (PRECEDEX) IV infusion 0.4 mcg/kg/hr (04/16/19 0630)  . feeding supplement (VITAL 1.5 CAL) 1,000 mL (04/16/19 0535)  . heparin 1,700 Units/hr (04/16/19 0630)  . HYDROmorphone 2 mg/hr (04/16/19 0630)  . midazolam 4 mg/hr (04/16/19 0630)  . phenylephrine (NEO-SYNEPHRINE) Adult infusion 30 mcg/min (04/16/19 0630)     Assessment: 58 yo male admitted 03/03/2019 with COVID-19 pneumonia requiring mechanical ventilation.  He was receiving VTE prophylaxis with lovenox 0.5mg /kg SQ q12h, LE dopplers negative on 11/1 and 11/8. Patient then developed Afib, was on heparin, then transitioned to apixaban.  Apixaban was held for possible tracheostomy procedure this week.  Pharmacy is consulted to dose Heparin IV.  Last apixaban 5 mg BID dose - 11/15 at ~0830  Heparin level 0.64, may be falsely elevated d/t recent apixaban APTT 59, slightly subtherapeutic CBC: Hgb 8.2, no Plt today.  Continue to monitor. No bleeding or complications reported. Heparin infusion paused this AM for possible trach procedure.  Goal of Therapy:  Heparin level 0.3-0.7 units/ml aPTT 66-102 seconds Monitor platelets by anticoagulation protocol: Yes   Plan:   Continue to hold heparin pending trach procedure, f/u post-procedure plans.  APTT and Heparin level 6 hours after starting  Daily heparin level and CBC   Gretta Arab PharmD, BCPS Clinical pharmacist phone 7am- 5pm: [phone] 04/16/2019 7:52 AM

## 2019-04-16 NOTE — Progress Notes (Signed)
[place] Progress Note Patient Name: Angel Stuart DOB: 23-Mar-1961 MRN: [id number]   Date of Service  04/16/2019  HPI/Events of Note  Hypotension  eICU Interventions  Phenylephrine infusion ordered        Frederik Pear 04/16/2019, 4:47 AM

## 2019-04-16 NOTE — Progress Notes (Signed)
ETT flushed with 10 mL normal saline per MD order. A small amount of pink/tan secretions obtained.

## 2019-04-16 NOTE — Progress Notes (Signed)
Pt returned from the procedure. VS stable. RR high, asynchrony. Appropriate medication titrated per order. Synchrony at this time,

## 2019-04-16 NOTE — Progress Notes (Signed)
NAME:  Angel Stuart, MRN:  [id number], DOB:  [birth year], LOS: 22 ADMISSION DATE:  03/21/2019, CONSULTATION DATE:  10/26 REFERRING MD:  Sharon Seller, CHIEF COMPLAINT:  Dyspnea   Brief History   58 yo male developed respiratory symptoms 10/05, and tested positive for COVID 19 on 10/09.  Presented to [facility] ER 10/25 with progressive dyspnea.  Found to have hypoxia and b/l pulmonary infiltrates, required intubation and transferred to [facility].  Past Medical History  HTN, HLD, DM  Significant Hospital Events   10/26 admission to ICU, prone position 10/29 fever 11/02 worsening oxygenation, persistent fever >> change ABx 11/05 d/c ABx 11/06 atrial fib with RVR 11/07 recurrent fever, change ABx 11/12 start pressure support weaning 1114 Fever 103.1, adjust ABx for HCAP  Consults:    Procedures:  10/26 ETT >  10/26 L subclavian CVL > 11/4  Significant Diagnostic Tests:  Doppler legs b/l 11/01 >> no DVT Echo 11/05 >> EF 60 to 65%, grade 1 DD, mild LVH  Micro Data:  Blood 10/29 >> negative Sputum 10/30 >> MSSA, Pseudomonas Sputum 11/07 >> Pseudomonas, pansensitive  Sputum 11/14 >> Pseudomonas, now with resistance   COVID Therapy:  Remdesivir 10/26 >> 10/30 Decadron 10/26 >> 11/04 Convalescent plasma 10/26  Antibiotics:  Zosyn 10/30 >>11/02 Vancomycin 10/30 >> 10/31 Fortaz 11/02 >> 11/05 Cefepime 11/07 >>  Vancomycin 11/14 >>   Interim history/subjective:   No events overnight, deeply sedated and paralyzed overnight  Objective   Blood pressure (!) 88/58, pulse 74, temperature 99.5 F (37.5 C), temperature source Rectal, resp. rate (!) 35, height 5\' 11"  (1.803 m), weight 115 kg, SpO2 96 %.    Vent Mode: PRVC FiO2 (%):  [50 %-100 %] 60 % Set Rate:  [28 bmp-35 bmp] 35 bmp Vt Set:  [550 mL-600 mL] 600 mL PEEP:  [5 cmH20-10 cmH20] 8 cmH20 Plateau Pressure:  [20 cmH20-30 cmH20] 29 cmH20   Intake/Output Summary (Last 24 hours) at 04/16/2019 04/18/2019 Last data filed at  04/16/2019 0900 Gross per 24 hour  Intake 4647.31 ml  Output 2350 ml  Net 2297.31 ml   Filed Weights   04/14/19 0500 04/15/19 0312 04/16/19 0316  Weight: 111.4 kg 113 kg 115 kg    Examination:  General Acutely ill appearing male, deeply sedated and paralyzed HEENT: [place]/AT, PERRL, EOM-none and ETT in place Cardiac RRR, Nl S1/S2 and -M/R/G Chest Coarse diffusely Abdomen Soft, NT, ND and +BS Extremities 2+ edema Skin -no significant rash Neuro -moves limbs spontaneously  I reviewed CXR myself, ETT is in a good position and infiltrate noted  Resolved Hospital Problem list   Septic shock, MSSA HCAP, Hypernatremia  Assessment & Plan:   Acute hypoxic, hypercapnic respiratory failure with ARDS from COVID 19 pneumonia. -Patient has completed course of remdesivir, Decadron plus convalescent plasma -Mental status and secretions are barriers to extubation at this time, will speak to family and proceed with tracheostomy in AM -Patient was able to tolerate brief period and pressure support this morning. -Wean FiO2 to maintain SPO2 greater than 88% -Chest x-ray reviewed: Worsening of bilateral infiltrates Proceed with tracheostomy today  Pseudomonal HCAP, ventilator associated pneumonia -Most recent sensitivities, intermediate cefepime, resistant to ceftazidime -Infectious disease consulted, we appreciate the recommendations -Presumed consideration for starting Carbapenem  Positive cumulative fluid balance -Lasix x3 and zaroxolyn ordered -BMET in AM -Replace electrolytes as indicated  New onset A fib with RVR. Hx of HTN, HLD. -Continue amiodarone, aspirin, Crestor, Eliquis -Eliquis held, heparin drip started, consult placed to pharmacy. -  Potential need for tracheostomy.  Hyperkalemia, metabolic alkalosis. -Continue diuresis -If needed can mitigate alkalosis with acetazolamide  Acute metabolic encephalopathy. -RASS goal 0 to -1 -Start trying to decrease some of the sedating  medications to help with liberation from vent  Anemia of critical illness. -Follow CBC, conservative transfusion threshold  Moderate protein calorie malnutrition. -Continue tube feeds  DM type II. -SI with Lantus  Best practice:  Diet: tube feeds DVT prophylaxis: Eliquis would being held due to plans for potential trach. GI prophylaxis: protonix Mobility: bed rest Code Status: Full Family: I called updated his wife [name]  Disposition: remain in ICU  Labs    CMP Latest Ref Rng & Units 04/16/2019 04/16/2019 04/15/2019  Glucose 70 - 99 mg/dL - - -  BUN 6 - 20 mg/dL - - -  Creatinine 0.61 - 1.24 mg/dL - - -  Sodium 135 - 145 mmol/L 129(L) 130(L) 131(L)  Potassium 3.5 - 5.1 mmol/L 5.8(H) 5.9(H) 4.8  Chloride 98 - 111 mmol/L - - -  CO2 22 - 32 mmol/L - - -  Calcium 8.9 - 10.3 mg/dL - - -  Total Protein 6.5 - 8.1 g/dL - - -  Total Bilirubin 0.3 - 1.2 mg/dL - - -  Alkaline Phos 38 - 126 U/L - - -  AST 15 - 41 U/L - - -  ALT 0 - 44 U/L - - -    CBC Latest Ref Rng & Units 04/16/2019 04/16/2019 04/15/2019  WBC 4.0 - 10.5 K/uL - - -  Hemoglobin 13.0 - 17.0 g/dL 8.2(L) 8.2(L) 8.5(L)  Hematocrit 39.0 - 52.0 % 24.0(L) 24.0(L) 25.0(L)  Platelets 150 - 400 K/uL - - -    ABG    Component Value Date/Time   PHART 7.353 04/16/2019 0601   PCO2ART 57.2 (H) 04/16/2019 0601   PO2ART 90.0 04/16/2019 0601   HCO3 31.8 (H) 04/16/2019 0601   TCO2 33 (H) 04/16/2019 0601   ACIDBASEDEF 3.0 (H) 2019-03-27 1442   O2SAT 96.0 04/16/2019 0601    CBG (last 3)  Recent Labs    04/15/19 2328 04/16/19 0312 04/16/19 0744  GLUCAP 119* 166* 221*   The patient is critically ill with multiple organ systems failure and requires high complexity decision making for assessment and support, frequent evaluation and titration of therapies, application of advanced monitoring technologies and extensive interpretation of multiple databases.   Critical Care Time devoted to patient care services described  in this note is  32  Minutes. This time reflects time of care of this signee Dr [name]. This critical care time does not reflect procedure time, or teaching time or supervisory time of PA/NP/Med student/Med Resident etc but could involve care discussion time.  Rush Farmer, M.D. [facility] Pulmonary/Critical Care Medicine.

## 2019-04-16 NOTE — Progress Notes (Signed)
Nutrition Follow-up RD working remotely.  DOCUMENTATION CODES:   Obesity unspecified  INTERVENTION:   Continue TF via postpyloric Cortrak tube:   Vital 1.5 at 65 ml/h (1560 ml per day)   Pro-stat 30 ml QID   Provides 2740 kcal, 165 gm protein, 1192 ml free water daily  NUTRITION DIAGNOSIS:   Increased nutrient needs related to acute illness as evidenced by estimated needs.  Ongoing   GOAL:   Provide needs based on ASPEN/SCCM guidelines   Met with TF  MONITOR:   Vent status, Labs, I & O's, TF tolerance  ASSESSMENT:   58 yo male admitted with progressive SOB after testing positive for COVID-19 on 10/9 (symptoms started 10/5). PMH includes DM, HLD.   10/28 Postpyloric Cortrak tube placed  Currently receiving Vital 1.5 at 65 ml/h with Pro-stat 30 ml QID. Free water flushes 200 ml every 8 hours.   S/P tracheostomy today. TF held for trach placement. Patient remains intubated on ventilator support via trach. MV: 13.9 L/min Temp (24hrs), Avg:100.3 F (37.9 C), Min:98.6 F (37 C), Max:102.6 F (39.2 C)   Labs reviewed. Sodium 132 (L), potassium 6.2 (H), phosphorus 6 (H) CBG's: [phone]  Medications reviewed and include lasix, novolog, lantus, Miralax, Flomax, zinc sulfate, neosynephrine, Nimbex.  I/O +11.6 L since admission Admit weight 111.4 kg Current weight 115 kg Patient with mild edema to BUE and mild generalized edema per RN documentation today.  Diet Order:   Diet Order            Diet NPO time specified  Diet effective now              EDUCATION NEEDS:   Not appropriate for education at this time  Skin:  Skin Assessment: Skin Integrity Issues: Skin Integrity Issues:: Unstageable Unstageable: upper lip  Last BM:  11/17 type 6/7  Height:   Ht Readings from Last 1 Encounters:  04/16/19 5' 11"  (1.803 m)    Weight:   Wt Readings from Last 1 Encounters:  04/16/19 115 kg   Admit weight 111.4 kg  Ideal Body Weight:  78.2  kg  BMI:  Body mass index is 35.36 kg/m.  Estimated Nutritional Needs:   Kcal:  2700-2800  Protein:  >/= 156 gm  Fluid:  >/= 1.8 L    Molli Barrows, RD, LDN, [place] Pager [phone] After Hours Pager [phone]

## 2019-04-16 NOTE — Progress Notes (Signed)
ANTICOAGULATION CONSULT NOTE - Follow Up Consult  Pharmacy Consult for Heparin Indication: atrial fibrillation  Patient Measurements: Height: 5\' 11"  (180.3 cm) Weight: 249 lb 1.9 oz (113 kg) IBW/kg (Calculated) : 75.3 Heparin Dosing Weight: 100kg  Vital Signs: Temp: 102.2 F (39 C) (11/17 0000) Temp Source: Rectal (11/16 1600) BP: 99/62 (11/17 0000) Pulse Rate: 87 (11/17 0000)  Labs: Recent Labs    04/13/19 0055 04/14/19 0545 04/14/19 1121 04/15/19 0550 04/15/19 1458 04/15/19 2239 04/15/19 2300  HGB 9.1* 8.7* 7.5* 7.3*  --  8.5*  --   HCT 30.0* 28.4* 22.0* 24.0*  --  25.0*  --   PLT 294 295  --  281  --   --   --   APTT  --   --   --   --  25  --  51*  HEPARINUNFRC  --   --   --   --  0.74*  --  0.64  CREATININE 0.99 0.69  --  0.85 0.88  --   --     Estimated Creatinine Clearance: 115.6 mL/min (by C-G formula based on SCr of 0.88 mg/dL).   Assessment: 58 yo male admitted 03/15/2019 with COVID-19 pneumonia requiring mechanical ventilation.  He was receiving VTE prophylaxis with lovenox 0.5mg /kg SQ q12h, LE dopplers negative on 11/1 and 11/8. Patient then developed Afib, was on heparin, then transitioned to apixaban.  Apixaban was held for possible tracheostomy procedure this week.  Pharmacy is consulted to dose Heparin IV.  Last apixaban 5 mg BID dose - 11/15 at ~0830 Heparin level 0.64 units/ml  APTT 51 sec  Goal of Therapy:  Heparin level 0.3-0.7 units/ml aPTT 66-102 seconds Monitor platelets by anticoagulation protocol: Yes   Plan:   Increase heparin to 1700 units/hr  APTT and Heparin level 6 hours after rate increase  Daily heparin level, aPTT and CBC  Thanks for allowing pharmacy to be a part of this patient's care.  Excell Seltzer, PharmD Clinical Pharmacist 04/16/2019 12:04 AM

## 2019-04-16 NOTE — Progress Notes (Signed)
Pt's wife called and updated by Devoria Glassing, RN. She was updated on his condition, need to go on vasopressor over night, as well as the need for a central line placement today, which she consented to. The pt will also be going to the OR this afternoon to receive tracheostomy placement by Dr. Nelda Marseille. We will plan to update the wife again after the trach placement.

## 2019-04-16 NOTE — Progress Notes (Signed)
      INFECTIOUS DISEASE ATTENDING ADDENDUM:   Date: 04/16/2019  Patient name: Angel Stuart  Medical record number: [id number]  Date of birth: 10/15/1960   Patient remains with high grade fevers  Would see how he does with several days of cipro  If fevers persist may need to send additional cultures from resp tract. Most recent org was quite S to [place] 04/16/2019, 12:03 PM

## 2019-04-16 NOTE — Procedures (Signed)
Bronchoscopy Procedure Note Stella Encarnacion [id number] Dec 27, 1960   . At first bronch was introduce through ET tube and structures of tracheal rings, carina identified for operator of tracheostomy who was Dr Nelda Marseille. Light of bronch passed through trachea and skin for indentification of tracheal rings for tracheostomy puncture. After this, under bronchoscopy guidance,  ET tube was pulled back sufficiently and very carefully. The ET tube was  pulled back enough to give room for tracheostomy operator and yet at same time to to ensure a secured airway. After this was accomplished, bronchoscope was withdrawn into the ET tube. After this,  Dr Nelda Marseille  then performed tracheostomy under video visual provided by flexible video bronchoscopy. Followng introduction of tracheostomy,  the bronchoscope was removed from ET tube and introduced through tracheostomy. Correct position of tracheostomy was ensured, with enough room between carina and distal tracheostomy and no evidence of bleeding. The bronchoscope was then withdrawn. Respiratory therapist was then instructed to remove the ET tube.  Dr Nelda Marseille then proceeded to complete the tracheostomy with stay sutures   No complications   Erick Colace ACNP-BC [place] Pager # [phone] OR # [phone] if no answer

## 2019-04-16 NOTE — Procedures (Signed)
Central Venous Catheter Insertion Procedure Note Angel Stuart [id number] [birth year]  Procedure: Insertion of Central Venous Catheter Indications: Assessment of intravascular volume, Drug and/or fluid administration and Frequent blood sampling  Procedure Details Consent: Risks of procedure as well as the alternatives and risks of each were explained to the (patient/caregiver).  Consent for procedure obtained. Time Out: Verified patient identification, verified procedure, site/side was marked, verified correct patient position, special equipment/implants available, medications/allergies/relevent history reviewed, required imaging and test results available.  Performed  Maximum sterile technique was used including antiseptics, cap, gloves, gown, hand hygiene, mask and sheet. Skin prep: Chlorhexidine; local anesthetic administered A antimicrobial bonded/coated triple lumen catheter was placed in the right subclavian vein using the Seldinger technique.  Evaluation Blood flow good Complications: No apparent complications Patient did tolerate procedure well. Chest X-ray ordered to verify placement.  CXR: pending.  Angel Stuart 04/16/2019, 11:02 AM

## 2019-04-16 NOTE — Procedures (Signed)
Central Venous Catheter Insertion Procedure Note Angel Stuart [id number] [birth year]  Procedure: Insertion of Central Venous Catheter Indications: Drug and/or fluid administration  Procedure Details Consent: Risks of procedure as well as the alternatives and risks of each were explained to the (patient/caregiver).  Consent for procedure obtained. Time Out: Verified patient identification, verified procedure, site/side was marked, verified correct patient position, special equipment/implants available, medications/allergies/relevent history reviewed, required imaging and test results available.  Performed  Real time [place] used to ID and cannulate the left IJ.  Attempted unsuccessfully no place in left [place]. Further assessment w/ [place] could not visualize the left [place] vein.   Maximum sterile technique was used including antiseptics, cap, gloves, gown, hand hygiene, mask and sheet. Skin prep: Chlorhexidine; local anesthetic administered A antimicrobial bonded/coated triple lumen catheter was placed in the left internal jugular vein using the Seldinger technique.  Evaluation Blood flow good Complications: No apparent complications Patient did tolerate procedure well. Chest X-ray ordered to verify placement.  CXR: pending.  Angel Stuart 04/16/2019, 3:48 PM  Erick Colace ACNP-BC [place] Pager # [phone] OR # [phone] if no answer

## 2019-04-17 ENCOUNTER — Inpatient Hospital Stay (HOSPITAL_COMMUNITY): Payer: PRIVATE HEALTH INSURANCE

## 2019-04-17 LAB — POCT I-STAT 7, (LYTES, BLD GAS, ICA,H+H)
Acid-Base Excess: 9 mmol/L — ABNORMAL HIGH (ref 0.0–2.0)
Acid-Base Excess: 8 mmol/L — ABNORMAL HIGH (ref 0.0–2.0)
Bicarbonate: 37.5 mmol/L — ABNORMAL HIGH (ref 20.0–28.0)
Bicarbonate: 34.5 mmol/L — ABNORMAL HIGH (ref 20.0–28.0)
Calcium, Ion: 1.21 mmol/L (ref 1.15–1.40)
Calcium, Ion: 1.2 mmol/L (ref 1.15–1.40)
HCT: 22 % — ABNORMAL LOW (ref 39.0–52.0)
HCT: 21 % — ABNORMAL LOW (ref 39.0–52.0)
Hemoglobin: 7.5 g/dL — ABNORMAL LOW (ref 13.0–17.0)
Hemoglobin: 7.1 g/dL — ABNORMAL LOW (ref 13.0–17.0)
O2 Saturation: 99 %
O2 Saturation: 83 %
Patient temperature: 98.7
Patient temperature: 101.4
Potassium: 5.1 mmol/L (ref 3.5–5.1)
Potassium: 4.8 mmol/L (ref 3.5–5.1)
Sodium: 133 mmol/L — ABNORMAL LOW (ref 135–145)
Sodium: 135 mmol/L (ref 135–145)
TCO2: 40 mmol/L — ABNORMAL HIGH (ref 22–32)
TCO2: 36 mmol/L — ABNORMAL HIGH (ref 22–32)
pCO2 arterial: 87.2 mmHg (ref 32.0–48.0)
pCO2 arterial: 63.4 mmHg — ABNORMAL HIGH (ref 32.0–48.0)
pH, Arterial: 7.243 — ABNORMAL LOW (ref 7.350–7.450)
pH, Arterial: 7.35 (ref 7.350–7.450)
pO2, Arterial: 150 mmHg — ABNORMAL HIGH (ref 83.0–108.0)
pO2, Arterial: 56 mmHg — ABNORMAL LOW (ref 83.0–108.0)

## 2019-04-17 LAB — BASIC METABOLIC PANEL
Anion gap: 9 (ref 5–15)
Anion gap: 9 (ref 5–15)
BUN: 53 mg/dL — ABNORMAL HIGH (ref 6–20)
BUN: 53 mg/dL — ABNORMAL HIGH (ref 6–20)
CO2: 35 mmol/L — ABNORMAL HIGH (ref 22–32)
CO2: 35 mmol/L — ABNORMAL HIGH (ref 22–32)
Calcium: 8.6 mg/dL — ABNORMAL LOW (ref 8.9–10.3)
Calcium: 8.5 mg/dL — ABNORMAL LOW (ref 8.9–10.3)
Chloride: 91 mmol/L — ABNORMAL LOW (ref 98–111)
Chloride: 94 mmol/L — ABNORMAL LOW (ref 98–111)
Creatinine, Ser: 1.23 mg/dL (ref 0.61–1.24)
Creatinine, Ser: 1.1 mg/dL (ref 0.61–1.24)
GFR calc Af Amer: >60 mL/min (ref >60)
GFR calc Af Amer: >60 mL/min (ref >60)
GFR calc non Af Amer: >60 mL/min (ref >60)
GFR calc non Af Amer: >60 mL/min (ref >60)
Glucose, Bld: 270 mg/dL — ABNORMAL HIGH (ref 70–99)
Glucose, Bld: 182 mg/dL — ABNORMAL HIGH (ref 70–99)
Potassium: 5.4 mmol/L — ABNORMAL HIGH (ref 3.5–5.1)
Potassium: 4.2 mmol/L (ref 3.5–5.1)
Sodium: 135 mmol/L (ref 135–145)
Sodium: 138 mmol/L (ref 135–145)

## 2019-04-17 LAB — TRIGLYCERIDES: Triglycerides: 133 mg/dL (ref <150)

## 2019-04-17 LAB — CBC
HCT: 23.9 % — ABNORMAL LOW (ref 39.0–52.0)
Hemoglobin: 7 g/dL — ABNORMAL LOW (ref 13.0–17.0)
MCH: 27.7 pg (ref 26.0–34.0)
MCHC: 29.3 g/dL — ABNORMAL LOW (ref 30.0–36.0)
MCV: 94.5 fL (ref 80.0–100.0)
Platelets: 380 10*3/uL (ref 150–400)
RBC: 2.53 MIL/uL — ABNORMAL LOW (ref 4.22–5.81)
RDW: 15.4 % (ref 11.5–15.5)
WBC: 18.6 10*3/uL — ABNORMAL HIGH (ref 4.0–10.5)
nRBC: 0.1 % (ref 0.0–0.2)

## 2019-04-17 LAB — GLUCOSE, CAPILLARY
Glucose-Capillary: 265 mg/dL — ABNORMAL HIGH (ref 70–99)
Glucose-Capillary: 231 mg/dL — ABNORMAL HIGH (ref 70–99)
Glucose-Capillary: 274 mg/dL — ABNORMAL HIGH (ref 70–99)
Glucose-Capillary: 194 mg/dL — ABNORMAL HIGH (ref 70–99)
Glucose-Capillary: 208 mg/dL — ABNORMAL HIGH (ref 70–99)
Glucose-Capillary: 161 mg/dL — ABNORMAL HIGH (ref 70–99)

## 2019-04-17 LAB — PHOSPHORUS
Phosphorus: 6.2 mg/dL — ABNORMAL HIGH (ref 2.5–4.6)
Phosphorus: 2.5 mg/dL (ref 2.5–4.6)

## 2019-04-17 LAB — MAGNESIUM
Magnesium: 1.9 mg/dL (ref 1.7–2.4)
Magnesium: 1.7 mg/dL (ref 1.7–2.4)

## 2019-04-17 LAB — APTT: aPTT: 40 seconds — ABNORMAL HIGH (ref 24–36)

## 2019-04-17 MED ORDER — FUROSEMIDE 10 MG/ML IJ SOLN
40.0000 mg | Freq: Four times a day (QID) | INTRAMUSCULAR | Status: AC
  Administered 2019-04-17 (×3): 40 mg via INTRAVENOUS
  Filled 2019-04-17 (×3): qty 4

## 2019-04-17 MED ORDER — "THROMBI-PAD 3""X3"" EX PADS"
1.0000 | MEDICATED_PAD | Freq: Once | CUTANEOUS | Status: DC
  Filled 2019-04-17: qty 1

## 2019-04-17 MED ORDER — METOLAZONE 10 MG PO TABS
10.0000 mg | ORAL_TABLET | Freq: Once | ORAL | Status: AC
  Administered 2019-04-17: 11:00:00 10 mg via ORAL
  Filled 2019-04-17: qty 1

## 2019-04-17 NOTE — Progress Notes (Signed)
      INFECTIOUS DISEASE ATTENDING ADDENDUM:   Date: 04/17/2019  Patient name: Angel Stuart  Medical record number: [id number]  Date of birth: [birth year]   Fevers seem on curve better vs day before  Continue ciprofloxacin for 7 day course provided she improves     Alcide Evener 04/17/2019, 5:08 PM

## 2019-04-17 NOTE — Procedures (Signed)
Percutaneous Tracheostomy Placement  Consent from family.  Patient sedated, paralyzed and position.  Placed on 100% FiO2 and RR matched.  Area cleaned and draped.  Lidocaine/epi injected.  Skin incision done followed by blunt dissection.  Trachea palpated then punctured, catheter passed and visualized bronchoscopically.  Wire placed and visualized.  Catheter removed.  Airway then entered and dilated.  Size 6 cuffed shiley trach placed and visualized bronchoscopically well above carina.  Good volume returns.  Patient tolerated the procedure well without complications.  Minimal blood loss.  CXR ordered and pending.  Wesam G. Yacoub, M.D. [place] Pulmonary/Critical Care Medicine.  

## 2019-04-17 NOTE — Progress Notes (Signed)
Called pt's wife [name] to update of pt condition and plan of care. Melissa appreciative of update.

## 2019-04-17 NOTE — Progress Notes (Signed)
ANTICOAGULATION CONSULT NOTE - Follow Up Consult  Pharmacy Consult for Heaprin Indication: atrial fibrillation  Allergies  Allergen Reactions  . Clindamycin Hcl Other (See Comments)    Other reaction(s): Other (See Comments) Other reaction(s): ANAPHYLAXIS Other reaction(s): ANAPHYLAXIS Other reaction(s): Other (See Comments) Other reaction(s): ANAPHYLAXIS Other reaction(s): ANAPHYLAXIS Other reaction(s): ANAPHYLAXIS   . 5-Alpha Reductase Inhibitors   . Semaglutide Nausea And Vomiting  . Hydromorphone Nausea And Vomiting    Per patient Per patient Per patient     Patient Measurements: Height: 5\' 11"  (180.3 cm) Weight: 257 lb 15 oz (117 kg) IBW/kg (Calculated) : 75.3 Heparin Dosing Weight: 100kg  Vital Signs: Temp: 98.7 F (37.1 C) (11/18 0400) Temp Source: Oral (11/18 0400) BP: 112/61 (11/18 0600) Pulse Rate: 106 (11/18 0600)  Labs: Recent Labs    04/15/19 0550  04/15/19 1458  04/15/19 2300  04/16/19 0601 04/16/19 0715 04/16/19 1640 04/17/19 0500 04/17/19 0612  HGB 7.3*  --   --    < >  --    < > 8.2*  --   --  7.0* 7.5*  HCT 24.0*  --   --    < >  --    < > 24.0*  --   --  23.9* 22.0*  PLT 281  --   --   --   --   --   --   --   --  380  --   APTT  --    < > 25  --  51*  --   --  59*  --  40*  --   HEPARINUNFRC  --   --  0.74*  --  0.64  --   --  0.64  --   --   --   CREATININE 0.85  --  0.88  --   --   --   --  1.24 1.21 1.23  --    < > = values in this interval not displayed.    Estimated Creatinine Clearance: 84.1 mL/min (by C-G formula based on SCr of 1.23 mg/dL).   Medications:  Infusions:  . sodium chloride 250 mL (04/16/19 1624)  . ciprofloxacin 400 mg (04/17/19 0511)  . cisatracurium (NIMBEX) infusion 6 mcg/kg/min (04/17/19 0500)  . dexmedetomidine (PRECEDEX) IV infusion Stopped (04/16/19 1322)  . feeding supplement (VITAL 1.5 CAL) 1,000 mL (04/17/19 0621)  . heparin Stopped (04/17/19 04/19/19)  . HYDROmorphone 4 mg/hr (04/17/19 0500)  .  midazolam 5 mg/hr (04/17/19 0618)  . phenylephrine (NEO-SYNEPHRINE) Adult infusion 25 mcg/min (04/17/19 0500)    Assessment: 58 yo male admitted 2019/04/09 with COVID-19 pneumonia requiring mechanical ventilation.  He was receiving VTE prophylaxis with lovenox 0.5mg /kg SQ q12h, LE dopplers negative on 11/1 and 11/8. Patient then developed Afib, was on heparin, then transitioned to apixaban.  Apixaban was held for possible tracheostomy procedure this week.  Pharmacy is consulted to dose Heparin IV, held the morning of 11/17 for trach, OK to resume on 11/18 am per MD.  Last apixaban 5 mg BID dose - 11/15 at ~0830  Heparin was to resume this morning at 07:00, but was held for trachea site bleeding per RN/Elink. CBC: Hgb decreased to 7, Plt wnl.  Goal of Therapy:  Heparin level 0.3-0.7 units/ml aPTT 66-102 seconds Monitor platelets by anticoagulation protocol: Yes   Plan:   Continue to hold heparin pending MD eval.  APTT and Heparin level 6 hours after starting  Daily heparin level and CBC   Helma Argyle  PharmD, BCPS Clinical pharmacist phone 7am- 5pm: [phone] 04/17/2019 7:13 AM

## 2019-04-17 NOTE — Progress Notes (Signed)
Notified Elink of bleeding from trach & advised that I did not feel comfortable re-starting heparin drip at this time. No new orders given at this time. Will continue to monitor for worsening bleeding.

## 2019-04-17 NOTE — Progress Notes (Signed)
NAME:  Angel Stuart, MRN:  [id number], DOB:  1961/04/17, LOS: 4 ADMISSION DATE:  03/24/2019, CONSULTATION DATE:  10/26 REFERRING MD:  Thereasa Solo, CHIEF COMPLAINT:  Dyspnea   Brief History   58 yo male developed respiratory symptoms 10/05, and tested positive for COVID 19 on 10/09.  Presented to [facility] ER 10/25 with progressive dyspnea.  Found to have hypoxia and b/l pulmonary infiltrates, required intubation and transferred to [facility].  Past Medical History  HTN, HLD, DM  Significant Hospital Events   10/26 admission to ICU, prone position 10/29 fever 11/02 worsening oxygenation, persistent fever >> change ABx 11/05 d/c ABx 11/06 atrial fib with RVR 11/07 recurrent fever, change ABx 11/12 start pressure support weaning 1114 Fever 103.1, adjust ABx for HCAP  Consults:    Procedures:  10/26 ETT > 11/17 Trach (JY) 11/17>>> 10/26 L subclavian CVL > 11/4 L IJ 11/17>>>  Significant Diagnostic Tests:  Doppler legs b/l 11/01 >> no DVT Echo 11/05 >> EF 60 to 65%, grade 1 DD, mild LVH  Micro Data:  Blood 10/29 >> negative Sputum 10/30 >> MSSA, Pseudomonas Sputum 11/07 >> Pseudomonas, pansensitive  Sputum 11/14 >> Pseudomonas, now with resistance   COVID Therapy:  Remdesivir 10/26 >> 10/30 Decadron 10/26 >> 11/04 Convalescent plasma 10/26  Antibiotics:  Zosyn 10/30 >>11/02 Vancomycin 10/30 >> 10/31 Fortaz 11/02 >> 11/05 Cefepime 11/07 >> off Vancomycin 11/14 >> off Cipro 11/16>>>  Interim history/subjective:   Intermittent trach oozing at the site, thrombi pads applied overnight with resolution Heparin held  Objective   Blood pressure 104/60, pulse (!) 101, temperature 98.7 F (37.1 C), temperature source Oral, resp. rate (!) 35, height 5\' 11"  (1.803 m), weight 117 kg, SpO2 100 %.    Vent Mode: PCV FiO2 (%):  [50 %-100 %] 100 % Set Rate:  [35 bmp] 35 bmp Vt Set:  [600 mL] 600 mL PEEP:  [8 cmH20] 8 cmH20 Plateau Pressure:  [26 cmH20-35 cmH20] 26 cmH20    Intake/Output Summary (Last 24 hours) at 04/17/2019 0820 Last data filed at 04/17/2019 0800 Gross per 24 hour  Intake 3073.52 ml  Output 4001 ml  Net -927.48 ml   Filed Weights   04/15/19 0312 04/16/19 0316 04/17/19 0500  Weight: 113 kg 115 kg 117 kg    Examination:  General: Acutely ill appearing male, remains paralyzed HEENT: [place]/AT, PERRL, EOM-I and MMM, trach in place Cardiac RRR, Nl S1/S2 and -M/R/G Chest: Diminished diffusely Abdomen: Soft, NT, ND and +BS Extremities: 2+ edema persists Skin -no significant rash Neuro -moves limbs spontaneously  I reviewed CXR myself, trach is in a good position, infiltrate noted  Resolved Hospital Problem list   Septic shock, MSSA HCAP, Hypernatremia  Assessment & Plan:   Acute hypoxic, hypercapnic respiratory failure with ARDS from COVID 19 pneumonia. - Patient has completed course of remdesivir, Decadron plus convalescent plasma - Increase PEEP to 12 and decrease FiO2 to 80 - Increase P high given respiratory acidosis - Patient was able to tolerate brief period and pressure support this morning. - Wean FiO2 to maintain SPO2 greater than 88% - Chest x-ray reviewed: Worsening of bilateral infiltrates - Monitor trach site for bleeding - Hold off heparin today and consider restarting in AM  Pseudomonal HCAP, ventilator associated pneumonia - Most recent sensitivities, intermediate cefepime, resistant to ceftazidime - Infectious disease consulted, we appreciate the recommendations - Presumed consideration for starting Carbapenem  Positive cumulative fluid balance - Lasix x3 and zaroxolyn ordered - BMET in AM - Replace  electrolytes as indicated - KVO IVF  New onset A fib with RVR. Hx of HTN, HLD. - Continue amiodarone, aspirin, Crestor, Eliquis - Continue to hold heparin today, consider restart in AM given bleeding around trach site - Potential need for tracheostomy.  Hyperkalemia, metabolic alkalosis. - Continue diuresis  - If needed can mitigate alkalosis with acetazolamide  Acute metabolic encephalopathy. - RASS goal 0 to -1 - Attempt to get paralytics off today  Anemia of critical illness. - Follow CBC, conservative transfusion threshold  Moderate protein calorie malnutrition. - Continue tube feeds - Will likely require PEG placement but will hold off for now  DM type II. - SI with Lantus  Best practice:  Diet: tube feeds DVT prophylaxis: Eliquis would being held due to plans for potential trach. GI prophylaxis: protonix Mobility: bed rest Code Status: Full Family: I called updated his wife Efraim Kaufmann  Disposition: remain in ICU  Labs    CMP Latest Ref Rng & Units 04/17/2019 04/17/2019 04/16/2019  Glucose 70 - 99 mg/dL - 826(E) 158(X)  BUN 6 - 20 mg/dL - 09(M) 07(W)  Creatinine 0.61 - 1.24 mg/dL - 8.08 8.11  Sodium 031 - 145 mmol/L 133(L) 135 135  Potassium 3.5 - 5.1 mmol/L 5.1 5.4(H) 4.5  Chloride 98 - 111 mmol/L - 91(L) 92(L)  CO2 22 - 32 mmol/L - 35(H) 32  Calcium 8.9 - 10.3 mg/dL - 8.6(L) 8.6(L)  Total Protein 6.5 - 8.1 g/dL - - -  Total Bilirubin 0.3 - 1.2 mg/dL - - -  Alkaline Phos 38 - 126 U/L - - -  AST 15 - 41 U/L - - -  ALT 0 - 44 U/L - - -    CBC Latest Ref Rng & Units 04/17/2019 04/17/2019 04/16/2019  WBC 4.0 - 10.5 K/uL - 18.6(H) -  Hemoglobin 13.0 - 17.0 g/dL 7.5(L) 7.0(L) 8.2(L)  Hematocrit 39.0 - 52.0 % 22.0(L) 23.9(L) 24.0(L)  Platelets 150 - 400 K/uL - 380 -    ABG    Component Value Date/Time   PHART 7.243 (L) 04/17/2019 0612   PCO2ART 87.2 (HH) 04/17/2019 0612   PO2ART 150.0 (H) 04/17/2019 0612   HCO3 37.5 (H) 04/17/2019 0612   TCO2 40 (H) 04/17/2019 0612   ACIDBASEDEF 3.0 (H) 03/08/2019 1442   O2SAT 99.0 04/17/2019 0612    CBG (last 3)  Recent Labs    04/16/19 1614 04/16/19 1950 04/17/19 0406  GLUCAP 98 235* 265*   The patient is critically ill with multiple organ systems failure and requires high complexity decision making for assessment and  support, frequent evaluation and titration of therapies, application of advanced monitoring technologies and extensive interpretation of multiple databases.   Critical Care Time devoted to patient care services described in this note is  31  Minutes. This time reflects time of care of this signee Dr Koren Bound. This critical care time does not reflect procedure time, or teaching time or supervisory time of PA/NP/Med student/Med Resident etc but could involve care discussion time.  Alyson Reedy, M.D. [facility] Pulmonary/Critical Care Medicine.

## 2019-04-17 NOTE — Progress Notes (Signed)
[place] Progress Note Patient Name: Angel Stuart DOB: 15-Feb-1961 MRN: [id number]   Date of Service  04/17/2019  HPI/Events of Note  Oozing around recent tracheostomy site, Pt is supposed to be started on a Heparin infusion.  eICU Interventions  Hold heparin infusion and notify incoming PCCM MD of bleeding, apply thrombin pad to the site.        Kerry Kass Janiyah Beery 04/17/2019, 7:09 AM

## 2019-04-18 ENCOUNTER — Other Ambulatory Visit: Payer: Self-pay

## 2019-04-18 LAB — BASIC METABOLIC PANEL
Anion gap: 10 (ref 5–15)
BUN: 51 mg/dL — ABNORMAL HIGH (ref 6–20)
CO2: 37 mmol/L — ABNORMAL HIGH (ref 22–32)
Calcium: 8.4 mg/dL — ABNORMAL LOW (ref 8.9–10.3)
Chloride: 92 mmol/L — ABNORMAL LOW (ref 98–111)
Creatinine, Ser: 1 mg/dL (ref 0.61–1.24)
GFR calc Af Amer: >60 mL/min (ref >60)
GFR calc non Af Amer: >60 mL/min (ref >60)
Glucose, Bld: 150 mg/dL — ABNORMAL HIGH (ref 70–99)
Potassium: 3.5 mmol/L (ref 3.5–5.1)
Sodium: 139 mmol/L (ref 135–145)

## 2019-04-18 LAB — GLUCOSE, CAPILLARY
Glucose-Capillary: 118 mg/dL — ABNORMAL HIGH (ref 70–99)
Glucose-Capillary: 132 mg/dL — ABNORMAL HIGH (ref 70–99)
Glucose-Capillary: 137 mg/dL — ABNORMAL HIGH (ref 70–99)
Glucose-Capillary: 157 mg/dL — ABNORMAL HIGH (ref 70–99)
Glucose-Capillary: 161 mg/dL — ABNORMAL HIGH (ref 70–99)
Glucose-Capillary: 167 mg/dL — ABNORMAL HIGH (ref 70–99)

## 2019-04-18 LAB — POCT I-STAT 7, (LYTES, BLD GAS, ICA,H+H)
Acid-Base Excess: 13 mmol/L — ABNORMAL HIGH (ref 0.0–2.0)
Bicarbonate: 39.3 mmol/L — ABNORMAL HIGH (ref 20.0–28.0)
Calcium, Ion: 1.17 mmol/L (ref 1.15–1.40)
HCT: 28 % — ABNORMAL LOW (ref 39.0–52.0)
Hemoglobin: 9.5 g/dL — ABNORMAL LOW (ref 13.0–17.0)
O2 Saturation: 93 %
Patient temperature: 98.6
Potassium: 3.6 mmol/L (ref 3.5–5.1)
Sodium: 138 mmol/L (ref 135–145)
TCO2: 41 mmol/L — ABNORMAL HIGH (ref 22–32)
pCO2 arterial: 59.7 mmHg — ABNORMAL HIGH (ref 32.0–48.0)
pH, Arterial: 7.426 (ref 7.350–7.450)
pO2, Arterial: 67 mmHg — ABNORMAL LOW (ref 83.0–108.0)

## 2019-04-18 LAB — CBC
HCT: 20.5 % — ABNORMAL LOW (ref 39.0–52.0)
Hemoglobin: 6.3 g/dL — CL (ref 13.0–17.0)
MCH: 28.1 pg (ref 26.0–34.0)
MCHC: 30.7 g/dL (ref 30.0–36.0)
MCV: 91.5 fL (ref 80.0–100.0)
Platelets: 396 10*3/uL (ref 150–400)
RBC: 2.24 MIL/uL — ABNORMAL LOW (ref 4.22–5.81)
RDW: 15.5 % (ref 11.5–15.5)
WBC: 15 10*3/uL — ABNORMAL HIGH (ref 4.0–10.5)
nRBC: 0.3 % — ABNORMAL HIGH (ref 0.0–0.2)

## 2019-04-18 LAB — PREPARE RBC (CROSSMATCH)

## 2019-04-18 LAB — HEMOGLOBIN AND HEMATOCRIT, BLOOD
HCT: 20.3 % — ABNORMAL LOW (ref 39.0–52.0)
HCT: 23.8 % — ABNORMAL LOW (ref 39.0–52.0)
Hemoglobin: 6.1 g/dL — CL (ref 13.0–17.0)
Hemoglobin: 7.2 g/dL — ABNORMAL LOW (ref 13.0–17.0)

## 2019-04-18 LAB — MAGNESIUM: Magnesium: 1.5 mg/dL — ABNORMAL LOW (ref 1.7–2.4)

## 2019-04-18 LAB — PHOSPHORUS: Phosphorus: 2.5 mg/dL (ref 2.5–4.6)

## 2019-04-18 LAB — TRIGLYCERIDES: Triglycerides: 82 mg/dL (ref <150)

## 2019-04-18 MED ORDER — FUROSEMIDE 10 MG/ML IJ SOLN
40.0000 mg | Freq: Four times a day (QID) | INTRAMUSCULAR | Status: AC
  Administered 2019-04-18 (×3): 40 mg via INTRAVENOUS
  Filled 2019-04-18 (×3): qty 4

## 2019-04-18 MED ORDER — BACID PO TABS: 2.0000 | ORAL_TABLET | Freq: Three times a day (TID) | ORAL | Status: DC

## 2019-04-18 MED ORDER — MAGNESIUM SULFATE 2 GM/50ML IV SOLN
2.0000 g | Freq: Once | INTRAVENOUS | Status: AC
  Administered 2019-04-18: 2 g via INTRAVENOUS
  Filled 2019-04-18: qty 50

## 2019-04-18 MED ORDER — METOLAZONE 10 MG PO TABS
10.0000 mg | ORAL_TABLET | Freq: Once | ORAL | Status: AC
  Administered 2019-04-18: 10 mg via ORAL
  Filled 2019-04-18: qty 1

## 2019-04-18 MED ORDER — FLORANEX PO PACK
1.0000 g | PACK | Freq: Three times a day (TID) | ORAL | Status: DC
  Administered 2019-04-18 – 2019-04-20 (×6): 1 g via ORAL
  Filled 2019-04-18 (×8): qty 1

## 2019-04-18 MED ORDER — POTASSIUM CHLORIDE 20 MEQ/15ML (10%) PO SOLN
40.0000 meq | Freq: Once | ORAL | Status: AC
  Administered 2019-04-18: 13:00:00 40 meq via ORAL
  Filled 2019-04-18: qty 30

## 2019-04-18 MED ORDER — SODIUM CHLORIDE 0.9% IV SOLUTION: Freq: Once | INTRAVENOUS | Status: AC

## 2019-04-18 MED ORDER — POTASSIUM PHOSPHATES 15 MMOLE/5ML IV SOLN
20.0000 mmol | Freq: Once | INTRAVENOUS | Status: AC
  Administered 2019-04-18: 20 mmol via INTRAVENOUS
  Filled 2019-04-18: qty 6.67

## 2019-04-18 NOTE — Progress Notes (Signed)
Called E-link to inform MD of the pt's Hgb of 6.3. MD is in an emergency. Spoke with Ivin Booty. Will pass on in handoff report.

## 2019-04-18 NOTE — Progress Notes (Signed)
Notified Dr Vaughan Browner that pt was dyssynchronous with vent, tachypneic into the 40s, and desating.  PRNs given with no response.  To start ordered precedex drip.  No new orders at this time.

## 2019-04-18 NOTE — Progress Notes (Signed)
Spoke with pts wife Melissa.  Updated on pt status.  Pt currently sedated on ventilator.  Off paralytic and pressors.  All questions answered.

## 2019-04-18 NOTE — Progress Notes (Addendum)
NAME:  Angel Stuart, MRN:  [id number], DOB:  1961-04-01, LOS: 24 ADMISSION DATE:  03/13/2019, CONSULTATION DATE:  10/26 REFERRING MD:  Sharon Seller, CHIEF COMPLAINT:  Dyspnea   Brief History   58 yo male developed respiratory symptoms 10/05, and tested positive for COVID 19 on 10/09.  Presented to [facility] ER 10/25 with progressive dyspnea.  Found to have hypoxia and b/l pulmonary infiltrates, required intubation and transferred to [facility].  Past Medical History  HTN, HLD, DM  Significant Hospital Events   10/26 admission to ICU, prone position 10/29 fever 11/02 worsening oxygenation, persistent fever >> change ABx 11/05 d/c ABx 11/06 atrial fib with RVR 11/07 recurrent fever, change ABx 11/12 start pressure support weaning 1114 Fever 103.1, adjust ABx for HCAP 11/17 Trach 11/19 Restarted on paralytics due to vent dyynchrony  Consults:    Procedures:  10/26 ETT > 11/17 Trach (JY) 11/17>>> 10/26 L subclavian CVL > 11/4 L IJ 11/17>>>  Significant Diagnostic Tests:  Doppler legs b/l 11/01 >> no DVT Echo 11/05 >> EF 60 to 65%, grade 1 DD, mild LVH  Micro Data:  Blood 10/29 >> negative Sputum 10/30 >> MSSA, Pseudomonas Sputum 11/07 >> Pseudomonas, pansensitive  Sputum 11/14 >> Pseudomonas, now with resistance   COVID Therapy:  Remdesivir 10/26 >> 10/30 Decadron 10/26 >> 11/04 Convalescent plasma 10/26  Antibiotics:  Zosyn 10/30 >>11/02 Vancomycin 10/30 >> 10/31 Fortaz 11/02 >> 11/05 Cefepime 11/07 >> off Vancomycin 11/14 >> off Cipro 11/16>>>  Interim history/subjective:  Restarted paralytics. On high dose sedation  Objective   Blood pressure 108/61, pulse (!) 101, temperature 99.7 F (37.6 C), temperature source Oral, resp. rate (!) 35, height 5\' 11"  (1.803 m), weight 110.2 kg, SpO2 93 %.    Vent Mode: PRVC FiO2 (%):  [40 %-60 %] 40 % Set Rate:  [35 bmp] 35 bmp Vt Set:  [600 mL] 600 mL PEEP:  [10 cmH20] 10 cmH20 Plateau Pressure:  [33 cmH20-41  cmH20] 40 cmH20   Intake/Output Summary (Last 24 hours) at 04/18/2019 0845 Last data filed at 04/18/2019 04/20/2019 Gross per 24 hour  Intake 3982.88 ml  Output 5285 ml  Net -1302.12 ml   Filed Weights   04/16/19 0316 04/17/19 0500 04/18/19 0500  Weight: 115 kg 117 kg 110.2 kg    Examination: Gen:      No acute distress HEENT:  EOMI, sclera anicteric Neck:     No masses; no thyromegaly, trach Lungs:    Clear to auscultation bilaterally; normal respiratory effort CV:         Regular rate and rhythm; no murmurs Abd:      + bowel sounds; soft, non-tender; no palpable masses, no distension Ext:    No edema; adequate peripheral perfusion Skin:      Warm and dry; no rash Neuro: Sedated, paralysed  Chest x-ray today reviewed with stable bilateral infiltrates.    Resolved Hospital Problem list   Septic shock, MSSA HCAP, Hypernatremia  Assessment & Plan:   Acute hypoxic, hypercapnic respiratory failure with ARDS from COVID 19 pneumonia. S/p trach Patient has completed course of remdesivir, Decadron plus convalescent plasma Wean PEEP as tolerated Follow chest x-ray Monitor trach site for bleeding. Will take off paralytics  Pseudomonal HCAP, ventilator associated pneumonia Appreciate ID input. Continue ciprofloxacin  Positive cumulative fluid balance Repeat Lasix and zaroxylyn  New onset A fib with RVR. Hx of HTN, HLD. Continue amiodarone, aspirin, Crestor, Eliquis Continue to hold heparin today, consider restart tomorrow given bleeding around trach  site  Hyperkalemia, metabolic alkalosis. Continue diuresis Monitor alkalosis.  Can use Diamox if needed.  Anemia of critical illness. Hemoglobin 6.1.  No evidence of active bleed Transfuse 1 unit.  Moderate protein calorie malnutrition. Continue tube feeds Will likely require PEG placement but will hold off for now  DM type II. SSI with Lantus  Best practice:  Diet: tube feeds DVT prophylaxis:  GI prophylaxis: protonix  Mobility: bed rest Code Status: Full Family: Wife Melissa updated 11/19 Disposition: remain in ICU  Labs    CMP Latest Ref Rng & Units 04/18/2019 04/18/2019 04/17/2019  Glucose 70 - 99 mg/dL - 150(H) 182(H)  BUN 6 - 20 mg/dL - 51(H) 53(H)  Creatinine 0.61 - 1.24 mg/dL - 1.00 1.10  Sodium 135 - 145 mmol/L 138 139 138  Potassium 3.5 - 5.1 mmol/L 3.6 3.5 4.2  Chloride 98 - 111 mmol/L - 92(L) 94(L)  CO2 22 - 32 mmol/L - 37(H) 35(H)  Calcium 8.9 - 10.3 mg/dL - 8.4(L) 8.5(L)  Total Protein 6.5 - 8.1 g/dL - - -  Total Bilirubin 0.3 - 1.2 mg/dL - - -  Alkaline Phos 38 - 126 U/L - - -  AST 15 - 41 U/L - - -  ALT 0 - 44 U/L - - -    CBC Latest Ref Rng & Units 04/18/2019 04/18/2019 04/18/2019  WBC 4.0 - 10.5 K/uL - - 15.0(H)  Hemoglobin 13.0 - 17.0 g/dL 6.1(LL) 9.5(L) 6.3(LL)  Hematocrit 39.0 - 52.0 % 20.3(L) 28.0(L) 20.5(L)  Platelets 150 - 400 K/uL - - 396    ABG    Component Value Date/Time   PHART 7.426 04/18/2019 0407   PCO2ART 59.7 (H) 04/18/2019 0407   PO2ART 67.0 (L) 04/18/2019 0407   HCO3 39.3 (H) 04/18/2019 0407   TCO2 41 (H) 04/18/2019 0407   ACIDBASEDEF 3.0 (H) 03/23/2019 1442   O2SAT 93.0 04/18/2019 0407    CBG (last 3)  Recent Labs    04/18/19 0034 04/18/19 0329 04/18/19 0820  GLUCAP 167* 132* 137*   The patient is critically ill with multiple organ system failure and requires high complexity decision making for assessment and support, frequent evaluation and titration of therapies, advanced monitoring, review of radiographic studies and interpretation of complex data.   Critical Care Time devoted to patient care services, exclusive of separately billable procedures, described in this note is 35  minutes.   Marshell Garfinkel MD [place] [facility] and [facility] Pager [phone] If no answer call [phone] 04/18/2019, 8:56 AM

## 2019-04-18 NOTE — Progress Notes (Signed)
Lavaged pt with 10 cc saline flush per MD order. Small amount of tan/yellow thick secretions suctioned out. Pt stable throughout with no complications. VS within normal limits

## 2019-04-18 NOTE — Progress Notes (Signed)
CRITICAL VALUE ALERT  Critical Value: hemoglobin 6.3  Date & Time Notied:  2897 04/18/19  Provider Notified: Loralee Pacas   Orders Received/Actions taken: Waiting for response

## 2019-04-18 NOTE — Progress Notes (Signed)
Repeat cbc needed with variance in hgb 9.5 and hgb 6.3 cbc drawn and istat.

## 2019-04-18 NOTE — Progress Notes (Signed)
Pharmacy Antibiotic Note  Angel Stuart is a 58 y.o. male admitted on 03/03/2019 with COVID-19 pneumonia, and developed Pseudomonas and MSSA pneumonia.  He has completed Cefepime x10d.  Pharmacy has been consulted for Cipro dosing. WBC remain elevated, but improved at 15.  Tm 101.4, SCr 1. Potential for DDI w/amiodarone.  QTc 449 on EKG.  Plan: Cipro 400mg  IV q8h Follow up renal function, culture results, and clinical course. Per ID (11/18): Continue ciprofloxacin for 7 day course provided she improves.  Height: 5\' 11"  (180.3 cm) Weight: 242 lb 15.2 oz (110.2 kg) IBW/kg (Calculated) : 75.3  Temp (24hrs), Avg:100.3 F (37.9 C), Min:99.2 F (37.3 C), Max:101.2 F (38.4 C)  Recent Labs  Lab 04/13/19 0055 04/14/19 0545 04/15/19 0550  04/16/19 0715 04/16/19 1640 04/17/19 0500 04/17/19 1700 04/18/19 0400  WBC 29.1* 24.6* 22.2*  --   --   --  18.6*  --  15.0*  CREATININE 0.99 0.69 0.85   < > 1.24 1.21 1.23 1.10 1.00   < > = values in this interval not displayed.    Estimated Creatinine Clearance: 100.5 mL/min (by C-G formula based on SCr of 1 mg/dL).    Allergies  Allergen Reactions  . Clindamycin Hcl Other (See Comments)    Other reaction(s): Other (See Comments) Other reaction(s): ANAPHYLAXIS Other reaction(s): ANAPHYLAXIS Other reaction(s): Other (See Comments) Other reaction(s): ANAPHYLAXIS Other reaction(s): ANAPHYLAXIS Other reaction(s): ANAPHYLAXIS   . 5-Alpha Reductase Inhibitors   . Semaglutide Nausea And Vomiting  . Hydromorphone Nausea And Vomiting    Per patient Per patient Per patient     Antimicrobials this admission: 10/27 remdesivir >> 10/30 10/30 Vanc >> 11/1 10/30 Zosyn >> 11/2 11/2 Ceftazidime >> 11/5 11/7 Cefepime >> (11/16) 11/14 Vanc >> 11/16 11/16 Cipro >>   Microbiology results: 10/26 MRSA PCR: negative 10/29 BCx ng x5 days (final)  10/30 exp sputum: moderate Pseudomonas aeruginosa  (pan-sens), moderate MSSA 11/7  Tracheal asp: moderate pseudomonas (pan-sens) 11/14 Resp Cx: moderate pseudomonas (sens to cipro, gent, imipenem)  Thank you for allowing pharmacy to be a part of this patient's care.  Gretta Arab PharmD, BCPS Clinical pharmacist phone 7am- 5pm: [phone] 04/18/2019 8:18 AM

## 2019-04-18 NOTE — Progress Notes (Signed)
      INFECTIOUS DISEASE ATTENDING ADDENDUM:   Date: 04/18/2019  Patient name: Angel Stuart  Medical record number: [id number]  Date of birth: 1961/05/16   Fevers seem on curve better as well as WBC trend  Continue ciprofloxacin for 7 day course   I will sign off for now  Please call with further questions.     Rhina Brackett Dam 04/18/2019, 2:14 PM

## 2019-04-19 ENCOUNTER — Inpatient Hospital Stay (HOSPITAL_COMMUNITY): Payer: PRIVATE HEALTH INSURANCE

## 2019-04-19 LAB — BASIC METABOLIC PANEL
Anion gap: 13 (ref 5–15)
BUN: 55 mg/dL — ABNORMAL HIGH (ref 6–20)
CO2: 37 mmol/L — ABNORMAL HIGH (ref 22–32)
Calcium: 8.5 mg/dL — ABNORMAL LOW (ref 8.9–10.3)
Chloride: 89 mmol/L — ABNORMAL LOW (ref 98–111)
Creatinine, Ser: 1 mg/dL (ref 0.61–1.24)
GFR calc Af Amer: >60 mL/min (ref >60)
GFR calc non Af Amer: >60 mL/min (ref >60)
Glucose, Bld: 166 mg/dL — ABNORMAL HIGH (ref 70–99)
Potassium: 3.4 mmol/L — ABNORMAL LOW (ref 3.5–5.1)
Sodium: 139 mmol/L (ref 135–145)

## 2019-04-19 LAB — PHOSPHORUS
Phosphorus: 3 mg/dL (ref 2.5–4.6)
Phosphorus: 4.3 mg/dL (ref 2.5–4.6)

## 2019-04-19 LAB — GLUCOSE, CAPILLARY
Glucose-Capillary: 122 mg/dL — ABNORMAL HIGH (ref 70–99)
Glucose-Capillary: 130 mg/dL — ABNORMAL HIGH (ref 70–99)
Glucose-Capillary: 152 mg/dL — ABNORMAL HIGH (ref 70–99)
Glucose-Capillary: 156 mg/dL — ABNORMAL HIGH (ref 70–99)
Glucose-Capillary: 157 mg/dL — ABNORMAL HIGH (ref 70–99)
Glucose-Capillary: 176 mg/dL — ABNORMAL HIGH (ref 70–99)

## 2019-04-19 LAB — TYPE AND SCREEN
ABO/RH(D): A POS
Antibody Screen: NEGATIVE
Unit division: 0

## 2019-04-19 LAB — CBC
HCT: 25 % — ABNORMAL LOW (ref 39.0–52.0)
Hemoglobin: 7.7 g/dL — ABNORMAL LOW (ref 13.0–17.0)
MCH: 27.2 pg (ref 26.0–34.0)
MCHC: 30.8 g/dL (ref 30.0–36.0)
MCV: 88.3 fL (ref 80.0–100.0)
Platelets: 440 10*3/uL — ABNORMAL HIGH (ref 150–400)
RBC: 2.83 MIL/uL — ABNORMAL LOW (ref 4.22–5.81)
RDW: 16.3 % — ABNORMAL HIGH (ref 11.5–15.5)
WBC: 17.1 10*3/uL — ABNORMAL HIGH (ref 4.0–10.5)
nRBC: 0.1 % (ref 0.0–0.2)

## 2019-04-19 LAB — BPAM RBC
Blood Product Expiration Date: 202012152359
ISSUE DATE / TIME: 202011191427
Unit Type and Rh: 6200

## 2019-04-19 LAB — TRIGLYCERIDES: Triglycerides: 108 mg/dL (ref <150)

## 2019-04-19 LAB — MAGNESIUM
Magnesium: 1.7 mg/dL (ref 1.7–2.4)
Magnesium: 1.8 mg/dL (ref 1.7–2.4)

## 2019-04-19 MED ORDER — ENOXAPARIN SODIUM 60 MG/0.6ML ~~LOC~~ SOLN
0.5000 mg/kg | Freq: Two times a day (BID) | SUBCUTANEOUS | Status: DC
  Administered 2019-04-19 – 2019-04-20 (×3): 55 mg via SUBCUTANEOUS
  Filled 2019-04-19 (×3): qty 0.6

## 2019-04-19 MED ORDER — ACETAZOLAMIDE SODIUM 500 MG IJ SOLR
500.0000 mg | Freq: Once | INTRAMUSCULAR | Status: AC
  Administered 2019-04-19: 500 mg via INTRAVENOUS
  Filled 2019-04-19: qty 500

## 2019-04-19 MED ORDER — FUROSEMIDE 10 MG/ML IJ SOLN
60.0000 mg | Freq: Four times a day (QID) | INTRAMUSCULAR | Status: AC
  Administered 2019-04-19 (×3): 60 mg via INTRAVENOUS
  Filled 2019-04-19 (×3): qty 6

## 2019-04-19 MED ORDER — STERILE WATER FOR INJECTION IJ SOLN
INTRAMUSCULAR | Status: AC
  Administered 2019-04-19: 5 mL
  Filled 2019-04-19: qty 10

## 2019-04-19 MED ORDER — POTASSIUM CHLORIDE 20 MEQ/15ML (10%) PO SOLN
40.0000 meq | Freq: Three times a day (TID) | ORAL | Status: AC
  Administered 2019-04-19 (×3): 40 meq via ORAL
  Filled 2019-04-19 (×3): qty 30

## 2019-04-19 MED ORDER — METOLAZONE 5 MG PO TABS
10.0000 mg | ORAL_TABLET | Freq: Once | ORAL | Status: AC
  Administered 2019-04-19: 10 mg via ORAL
  Filled 2019-04-19: qty 2

## 2019-04-19 MED ORDER — OXYCODONE HCL 5 MG PO TABS
10.0000 mg | ORAL_TABLET | Freq: Four times a day (QID) | ORAL | Status: DC
  Administered 2019-04-19 – 2019-04-22 (×11): 10 mg
  Filled 2019-04-19 (×11): qty 2

## 2019-04-19 MED ORDER — MAGNESIUM SULFATE 2 GM/50ML IV SOLN
2.0000 g | Freq: Once | INTRAVENOUS | Status: AC
  Administered 2019-04-19: 13:00:00 2 g via INTRAVENOUS
  Filled 2019-04-19: qty 50

## 2019-04-19 NOTE — Progress Notes (Addendum)
NAME:  Angel Stuart, MRN:  [id number], DOB:  1960-11-27, LOS: 25 ADMISSION DATE:  03/14/2019, CONSULTATION DATE:  10/26 REFERRING MD:  Thereasa Solo, CHIEF COMPLAINT:  Dyspnea   Brief History   58 yo male developed respiratory symptoms 10/05, and tested positive for COVID 19 on 10/09.  Presented to [facility] ER 10/25 with progressive dyspnea.  Found to have hypoxia and b/l pulmonary infiltrates, required intubation and transferred to [facility].  Past Medical History  HTN, HLD, DM  Significant Hospital Events   10/26 admission to ICU, prone position 10/29 fever 11/02 worsening oxygenation, persistent fever >> change ABx 11/05 d/c ABx 11/06 atrial fib with RVR 11/07 recurrent fever, change ABx 11/12 start pressure support weaning 1114 Fever 103.1, adjust ABx for HCAP 11/17 Trach 11/19 Restarted on paralytics due to vent dyynchrony, 1 unti PRBC for low Hb  Consults:    Procedures:  10/26 ETT > 11/17 Trach (JY) 11/17>>> 10/26 L subclavian CVL > 11/4 L IJ 11/17>>>  Significant Diagnostic Tests:  Doppler legs b/l 11/01 >> no DVT Echo 11/05 >> EF 60 to 65%, grade 1 DD, mild LVH  Micro Data:  Blood 10/29 >> negative Sputum 10/30 >> MSSA, Pseudomonas Sputum 11/07 >> Pseudomonas, pansensitive  Sputum 11/14 >> Pseudomonas, now with resistance   COVID Therapy:  Remdesivir 10/26 >> 10/30 Decadron 10/26 >> 11/04 Convalescent plasma 10/26  Antibiotics:  Zosyn 10/30 >>11/02 Vancomycin 10/30 >> 10/31 Fortaz 11/02 >> 11/05 Cefepime 11/07 >> off Vancomycin 11/14 >> off Cipro 11/16>>>  Interim history/subjective:  Weaned off paralytics yesterday.  On high-dose sedation with Dilaudid, Precedex and Versed drips.  Objective   Blood pressure 99/63, pulse 73, temperature 100.1 F (37.8 C), temperature source Oral, resp. rate (!) 21, height 5\' 11"  (1.803 m), weight 110.5 kg, SpO2 97 %.    Vent Mode: PRVC FiO2 (%):  [40 %] 40 % Set Rate:  [35 bmp] 35 bmp Vt Set:  [600 mL] 600  mL PEEP:  [10 cmH20] 10 cmH20 Plateau Pressure:  [26 cmH20-34 cmH20] 34 cmH20   Intake/Output Summary (Last 24 hours) at 04/19/2019 0846 Last data filed at 04/19/2019 0602 Gross per 24 hour  Intake 5500.52 ml  Output 4330 ml  Net 1170.52 ml   Filed Weights   04/17/19 0500 04/18/19 0500 04/19/19 0500  Weight: 117 kg 110.2 kg 110.5 kg    Examination: Gen:      No acute distress HEENT:  EOMI, sclera anicteric Neck:     No masses; no thyromegaly, trach  Lungs:    Clear to auscultation bilaterally; normal respiratory effort CV:         Regular rate and rhythm; no murmurs Abd:      + bowel sounds; soft, non-tender; no palpable masses, no distension Ext:    No edema; adequate peripheral perfusion Skin:      Warm and dry; no rash Neuro: sedated    Resolved Hospital Problem list   Septic shock, MSSA HCAP, Hypernatremia  Assessment & Plan:   Acute hypoxic, hypercapnic respiratory failure with ARDS from COVID 19 pneumonia. S/p trach Patient has completed course of remdesivir, Decadron plus convalescent plasma Wean PEEP as tolerated Follow chest x-ray Monitor trach site for bleeding. Monitor off paralytics  Pseudomonal HCAP, ventilator associated pneumonia Appreciate ID input. Continue ciprofloxacin for 7 days  Positive cumulative fluid balance Increase Lasix dose.  Repeat Zaroxolyn  New onset A fib with RVR> now in NSR Hx of HTN, HLD. Continue amiodarone, aspirin, Crestor, Eliquis Heparin held  due trach bleeding. Will switch to lovenox for prophylaxis Does not need fill anticoagulation as he is in NSR.  Hyperkalemia, metabolic alkalosis. Continue diuresis Monitor alkalosis.  Will use 1 dose of Diamox  Anemia of critical illness. S/p 1 unit PRBC Monitor CBC.  Moderate protein calorie malnutrition. Continue tube feeds Will likely require PEG placement but will hold off for now  DM type II. SSI with Lantus  Goals of care Wife Melissa updated daily. She requested  11/20 that Kiondre me made DNR.   Best practice:  Diet: tube feeds DVT prophylaxis: Lovenix GI prophylaxis: protonix Mobility: bed rest Code Status: DNR Family: See above Disposition: remain in ICU  Labs    CMP Latest Ref Rng & Units 04/19/2019 04/18/2019 04/18/2019  Glucose 70 - 99 mg/dL 101(B) - 510(C)  BUN 6 - 20 mg/dL 58(N) - 27(P)  Creatinine 0.61 - 1.24 mg/dL 8.24 - 2.35  Sodium 361 - 145 mmol/L 139 138 139  Potassium 3.5 - 5.1 mmol/L 3.4(L) 3.6 3.5  Chloride 98 - 111 mmol/L 89(L) - 92(L)  CO2 22 - 32 mmol/L 37(H) - 37(H)  Calcium 8.9 - 10.3 mg/dL 4.4(R) - 8.4(L)  Total Protein 6.5 - 8.1 g/dL - - -  Total Bilirubin 0.3 - 1.2 mg/dL - - -  Alkaline Phos 38 - 126 U/L - - -  AST 15 - 41 U/L - - -  ALT 0 - 44 U/L - - -    CBC Latest Ref Rng & Units 04/19/2019 04/18/2019 04/18/2019  WBC 4.0 - 10.5 K/uL 17.1(H) - -  Hemoglobin 13.0 - 17.0 g/dL 7.7(L) 7.2(L) 6.1(LL)  Hematocrit 39.0 - 52.0 % 25.0(L) 23.8(L) 20.3(L)  Platelets 150 - 400 K/uL 440(H) - -    ABG    Component Value Date/Time   PHART 7.426 04/18/2019 0407   PCO2ART 59.7 (H) 04/18/2019 0407   PO2ART 67.0 (L) 04/18/2019 0407   HCO3 39.3 (H) 04/18/2019 0407   TCO2 41 (H) 04/18/2019 0407   ACIDBASEDEF 3.0 (H) 03/03/2019 1442   O2SAT 93.0 04/18/2019 0407    CBG (last 3)  Recent Labs    04/18/19 1541 04/18/19 1959 04/19/19 0010  GLUCAP 161* 157* 156*   The patient is critically ill with multiple organ system failure and requires high complexity decision making for assessment and support, frequent evaluation and titration of therapies, advanced monitoring, review of radiographic studies and interpretation of complex data.   Critical Care Time devoted to patient care services, exclusive of separately billable procedures, described in this note is 35  minutes.   Chilton Greathouse MD [place] [facility] Pager [phone] If no answer call [phone] 04/19/2019, 8:46 AM

## 2019-04-19 NOTE — Progress Notes (Signed)
Lavaged pt with 10 cc saline flush per MD order. COpious amounts of yellow thick secretions suctioned out. Pt stable throughout with no complications. VS within normal limits.

## 2019-04-19 NOTE — Progress Notes (Signed)
Call to wife to update her on pts status and plan of care. Questions answered. Support given. FaceTime call initiated with pts family.

## 2019-04-20 LAB — BASIC METABOLIC PANEL
Anion gap: 12 (ref 5–15)
BUN: 55 mg/dL — ABNORMAL HIGH (ref 6–20)
CO2: 33 mmol/L — ABNORMAL HIGH (ref 22–32)
Calcium: 8.4 mg/dL — ABNORMAL LOW (ref 8.9–10.3)
Chloride: 93 mmol/L — ABNORMAL LOW (ref 98–111)
Creatinine, Ser: 1.14 mg/dL (ref 0.61–1.24)
GFR calc Af Amer: >60 mL/min (ref >60)
GFR calc non Af Amer: >60 mL/min (ref >60)
Glucose, Bld: 153 mg/dL — ABNORMAL HIGH (ref 70–99)
Potassium: 4 mmol/L (ref 3.5–5.1)
Sodium: 138 mmol/L (ref 135–145)

## 2019-04-20 LAB — GLUCOSE, CAPILLARY
Glucose-Capillary: 108 mg/dL — ABNORMAL HIGH (ref 70–99)
Glucose-Capillary: 133 mg/dL — ABNORMAL HIGH (ref 70–99)
Glucose-Capillary: 159 mg/dL — ABNORMAL HIGH (ref 70–99)
Glucose-Capillary: 159 mg/dL — ABNORMAL HIGH (ref 70–99)
Glucose-Capillary: 184 mg/dL — ABNORMAL HIGH (ref 70–99)
Glucose-Capillary: 184 mg/dL — ABNORMAL HIGH (ref 70–99)
Glucose-Capillary: 185 mg/dL — ABNORMAL HIGH (ref 70–99)

## 2019-04-20 LAB — CBC
HCT: 26.1 % — ABNORMAL LOW (ref 39.0–52.0)
Hemoglobin: 7.9 g/dL — ABNORMAL LOW (ref 13.0–17.0)
MCH: 27 pg (ref 26.0–34.0)
MCHC: 30.3 g/dL (ref 30.0–36.0)
MCV: 89.1 fL (ref 80.0–100.0)
Platelets: 518 10*3/uL — ABNORMAL HIGH (ref 150–400)
RBC: 2.93 MIL/uL — ABNORMAL LOW (ref 4.22–5.81)
RDW: 15.9 % — ABNORMAL HIGH (ref 11.5–15.5)
WBC: 19.7 10*3/uL — ABNORMAL HIGH (ref 4.0–10.5)
nRBC: 0.1 % (ref 0.0–0.2)

## 2019-04-20 LAB — MAGNESIUM
Magnesium: 1.8 mg/dL (ref 1.7–2.4)
Magnesium: 1.8 mg/dL (ref 1.7–2.4)

## 2019-04-20 LAB — PHOSPHORUS
Phosphorus: 4.3 mg/dL (ref 2.5–4.6)
Phosphorus: 3.5 mg/dL (ref 2.5–4.6)

## 2019-04-20 LAB — TRIGLYCERIDES: Triglycerides: 102 mg/dL (ref <150)

## 2019-04-20 MED ORDER — ENOXAPARIN SODIUM 40 MG/0.4ML ~~LOC~~ SOLN
40.0000 mg | Freq: Two times a day (BID) | SUBCUTANEOUS | Status: DC
  Administered 2019-04-20 – 2019-04-29 (×19): 40 mg via SUBCUTANEOUS
  Filled 2019-04-20 (×20): qty 0.4

## 2019-04-20 MED ORDER — SACCHAROMYCES BOULARDII 250 MG PO CAPS
250.0000 mg | ORAL_CAPSULE | Freq: Two times a day (BID) | ORAL | Status: DC
  Administered 2019-04-20 – 2019-04-30 (×17): 250 mg
  Filled 2019-04-20 (×21): qty 1

## 2019-04-20 NOTE — Progress Notes (Addendum)
NAME:  Angel Stuart, MRN:  [id number], DOB:  Jun 29, 1960, LOS: 26 ADMISSION DATE:  03/21/2019, CONSULTATION DATE:  10/26 REFERRING MD:  Sharon Seller, CHIEF COMPLAINT:  Dyspnea   Brief History   58 yo male developed respiratory symptoms 10/05, and tested positive for COVID 19 on 10/09.  Presented to [facility] ER 10/25 with progressive dyspnea.  Found to have hypoxia and b/l pulmonary infiltrates, required intubation and transferred to [facility].  Past Medical History  HTN, HLD, DM  Significant Hospital Events   10/26 admission to ICU, prone position 10/29 fever 11/02 worsening oxygenation, persistent fever >> change ABx 11/05 d/c ABx 11/06 atrial fib with RVR 11/07 recurrent fever, change ABx 11/12 start pressure support weaning 1114 Fever 103.1, adjust ABx for HCAP 11/17 Trach 11/19 Restarted on paralytics due to vent dyynchrony, 1 unit PRBC for low Hb 11/20 off paralytics  Consults:    Procedures:  10/26 ETT > 11/17 Trach (JY) 11/17>>> 10/26 L subclavian CVL > 11/4 L IJ 11/17>>>  Significant Diagnostic Tests:  Doppler legs b/l 11/01 >> no DVT Echo 11/05 >> EF 60 to 65%, grade 1 DD, mild LVH  Micro Data:  Blood 10/29 >> negative Sputum 10/30 >> MSSA, Pseudomonas Sputum 11/07 >> Pseudomonas, pansensitive  Sputum 11/14 >> Pseudomonas, now with resistance   COVID Therapy:  Remdesivir 10/26 >> 10/30 Decadron 10/26 >> 11/04 Convalescent plasma 10/26  Antibiotics:  Zosyn 10/30 >>11/02 Vancomycin 10/30 >> 10/31 Fortaz 11/02 >> 11/05 Cefepime 11/07 >> off Vancomycin 11/14 >> off Cipro 11/16>>>  Interim history/subjective:  PEEP/FiO2 weaning down.  Still on high dose sedation  Objective   Blood pressure 113/65, pulse 95, temperature 98.9 F (37.2 C), temperature source Oral, resp. rate (!) 31, height 5\' 11"  (1.803 m), weight 107.6 kg, SpO2 100 %.    Vent Mode: PRVC FiO2 (%):  [40 %] 40 % Set Rate:  [35 bmp] 35 bmp Vt Set:  [600 mL] 600 mL PEEP:  [8 cmH20] 8  cmH20 Plateau Pressure:  [22 cmH20-41 cmH20] 22 cmH20   Intake/Output Summary (Last 24 hours) at 04/20/2019 0900 Last data filed at 04/20/2019 0757 Gross per 24 hour  Intake 3349.26 ml  Output 5690 ml  Net -2340.74 ml   Filed Weights   04/18/19 0500 04/19/19 0500 04/20/19 0500  Weight: 110.2 kg 110.5 kg 107.6 kg    Examination: Gen:      No acute distress HEENT:  EOMI, sclera anicteric Neck:     No masses; no thyromegaly, trach Lungs:    Clear to auscultation bilaterally; normal respiratory effort CV:         Regular rate and rhythm; no murmurs Abd:      + bowel sounds; soft, non-tender; no palpable masses, no distension Ext:    No edema; adequate peripheral perfusion Skin:      Warm and dry; no rash Neuro: sedated, unresponsive   Resolved Hospital Problem list   Septic shock, MSSA HCAP, Hypernatremia  Assessment & Plan:   Acute hypoxic, hypercapnic respiratory failure with ARDS from COVID 19 pneumonia. S/p trach Wean PEEP as tolerated Follow intermittent chest x-ray Monitor trach site for bleeding.  Pseudomonal HCAP, ventilator associated pneumonia Leucocytosis Appreciate ID input. Continue ciprofloxacin for 7 days  Positive cumulative fluid balance Continue lasix and zaroxylyn for diuresis  New onset A fib with RVR> now in NSR Hx of HTN, HLD. Continue amiodarone, aspirin, Crestor, Eliquis Heparin gtt held due trach bleeding. Will switch to lovenox for prophylaxis Does not need full  anticoagulation as he is in NSR.  Hyperkalemia, metabolic alkalosis. Continue diuresis S/p Diamox x 1  Anemia of critical illness. S/p 1 unit PRBC Monitor CBC.  Moderate protein calorie malnutrition. Continue tube feeds Will likely require PEG placement but will hold off for now  DM type II. SSI with Lantus  Goals of care Wife Melissa updated 11/21. Code status is DNR  Best practice:  Diet: tube feeds DVT prophylaxis: Lovenix GI prophylaxis: protonix Mobility: bed  rest Code Status: DNR Family: See above Disposition: remain in ICU  Labs    CMP Latest Ref Rng & Units 04/19/2019 04/18/2019 04/18/2019  Glucose 70 - 99 mg/dL 166(H) - 150(H)  BUN 6 - 20 mg/dL 55(H) - 51(H)  Creatinine 0.61 - 1.24 mg/dL 1.00 - 1.00  Sodium 135 - 145 mmol/L 139 138 139  Potassium 3.5 - 5.1 mmol/L 3.4(L) 3.6 3.5  Chloride 98 - 111 mmol/L 89(L) - 92(L)  CO2 22 - 32 mmol/L 37(H) - 37(H)  Calcium 8.9 - 10.3 mg/dL 8.5(L) - 8.4(L)  Total Protein 6.5 - 8.1 g/dL - - -  Total Bilirubin 0.3 - 1.2 mg/dL - - -  Alkaline Phos 38 - 126 U/L - - -  AST 15 - 41 U/L - - -  ALT 0 - 44 U/L - - -    CBC Latest Ref Rng & Units 04/20/2019 04/19/2019 04/18/2019  WBC 4.0 - 10.5 K/uL 19.7(H) 17.1(H) -  Hemoglobin 13.0 - 17.0 g/dL 7.9(L) 7.7(L) 7.2(L)  Hematocrit 39.0 - 52.0 % 26.1(L) 25.0(L) 23.8(L)  Platelets 150 - 400 K/uL 518(H) 440(H) -    ABG    Component Value Date/Time   PHART 7.426 04/18/2019 0407   PCO2ART 59.7 (H) 04/18/2019 0407   PO2ART 67.0 (L) 04/18/2019 0407   HCO3 39.3 (H) 04/18/2019 0407   TCO2 41 (H) 04/18/2019 0407   ACIDBASEDEF 3.0 (H) 2019-04-12 1442   O2SAT 93.0 04/18/2019 0407    CBG (last 3)  Recent Labs    04/20/19 0055 04/20/19 0435 04/20/19 0755  GLUCAP 184* 159* 133*   The patient is critically ill with multiple organ system failure and requires high complexity decision making for assessment and support, frequent evaluation and titration of therapies, advanced monitoring, review of radiographic studies and interpretation of complex data.   Critical Care Time devoted to patient care services, exclusive of separately billable procedures, described in this note is 35  minutes.   Marshell Garfinkel MD [facility] Pager [phone] If no answer call [phone] 04/20/2019, 9:00 AM

## 2019-04-20 NOTE — Progress Notes (Signed)
20 ml of Versed wasted in stericycle. Witnessed by Etta Quill RN.

## 2019-04-20 NOTE — Progress Notes (Signed)
Lavaged pt with 10 cc saline flush per CCM MD order.  Copious amounts of yellow thick secretions suctioned out. Pt stable throughout with no complications. VS within normal limits.

## 2019-04-20 NOTE — Progress Notes (Signed)
Called pt's wife, [name], to update of pt condition and plan of care. Melissa appreciative of update.

## 2019-04-21 LAB — URINALYSIS, ROUTINE W REFLEX MICROSCOPIC
Bilirubin Urine: NEGATIVE
Glucose, UA: 150 mg/dL — AB
Ketones, ur: NEGATIVE mg/dL
Leukocytes,Ua: NEGATIVE
Nitrite: NEGATIVE
Protein, ur: 30 mg/dL — AB
RBC / HPF: >50 RBC/hpf — ABNORMAL HIGH (ref 0–5)
Specific Gravity, Urine: 1.021 (ref 1.005–1.030)
pH: 5 (ref 5.0–8.0)

## 2019-04-21 LAB — GLUCOSE, CAPILLARY
Glucose-Capillary: 163 mg/dL — ABNORMAL HIGH (ref 70–99)
Glucose-Capillary: 229 mg/dL — ABNORMAL HIGH (ref 70–99)
Glucose-Capillary: 261 mg/dL — ABNORMAL HIGH (ref 70–99)
Glucose-Capillary: 245 mg/dL — ABNORMAL HIGH (ref 70–99)
Glucose-Capillary: 233 mg/dL — ABNORMAL HIGH (ref 70–99)
Glucose-Capillary: 199 mg/dL — ABNORMAL HIGH (ref 70–99)
Glucose-Capillary: 255 mg/dL — ABNORMAL HIGH (ref 70–99)

## 2019-04-21 LAB — MAGNESIUM
Magnesium: 1.7 mg/dL (ref 1.7–2.4)
Magnesium: 1.7 mg/dL (ref 1.7–2.4)

## 2019-04-21 LAB — BASIC METABOLIC PANEL
Anion gap: 11 (ref 5–15)
BUN: 57 mg/dL — ABNORMAL HIGH (ref 6–20)
CO2: 32 mmol/L (ref 22–32)
Calcium: 8.6 mg/dL — ABNORMAL LOW (ref 8.9–10.3)
Chloride: 98 mmol/L (ref 98–111)
Creatinine, Ser: 0.95 mg/dL (ref 0.61–1.24)
GFR calc Af Amer: >60 mL/min (ref >60)
GFR calc non Af Amer: >60 mL/min (ref >60)
Glucose, Bld: 200 mg/dL — ABNORMAL HIGH (ref 70–99)
Potassium: 3.8 mmol/L (ref 3.5–5.1)
Sodium: 141 mmol/L (ref 135–145)

## 2019-04-21 LAB — CBC
HCT: 26.9 % — ABNORMAL LOW (ref 39.0–52.0)
Hemoglobin: 8.1 g/dL — ABNORMAL LOW (ref 13.0–17.0)
MCH: 27.1 pg (ref 26.0–34.0)
MCHC: 30.1 g/dL (ref 30.0–36.0)
MCV: 90 fL (ref 80.0–100.0)
Platelets: 643 10*3/uL — ABNORMAL HIGH (ref 150–400)
RBC: 2.99 MIL/uL — ABNORMAL LOW (ref 4.22–5.81)
RDW: 16.1 % — ABNORMAL HIGH (ref 11.5–15.5)
WBC: 26.8 10*3/uL — ABNORMAL HIGH (ref 4.0–10.5)
nRBC: 0.1 % (ref 0.0–0.2)

## 2019-04-21 LAB — TRIGLYCERIDES: Triglycerides: 69 mg/dL (ref <150)

## 2019-04-21 LAB — PHOSPHORUS
Phosphorus: 3.7 mg/dL (ref 2.5–4.6)
Phosphorus: 3.3 mg/dL (ref 2.5–4.6)

## 2019-04-21 MED ORDER — FUROSEMIDE 10 MG/ML IJ SOLN
40.0000 mg | Freq: Three times a day (TID) | INTRAMUSCULAR | Status: DC
  Administered 2019-04-21 – 2019-04-24 (×9): 40 mg via INTRAVENOUS
  Filled 2019-04-21 (×9): qty 4

## 2019-04-21 NOTE — Progress Notes (Signed)
Facetime call made to pt's wife and pt's daughter. Updated of pt condition and plan of care. Pt's family appreciates call.

## 2019-04-21 NOTE — Progress Notes (Signed)
NAME:  Angel Stuart, MRN:  [id number], DOB:  1961-02-11, LOS: 27 ADMISSION DATE:  2019/03/30, CONSULTATION DATE:  10/26 REFERRING MD:  Sharon Seller, CHIEF COMPLAINT:  Dyspnea   Brief History   58 yo male developed respiratory symptoms 10/05, and tested positive for COVID 19 on 10/09.  Presented to [facility] ER 10/25 with progressive dyspnea.  Found to have hypoxia and b/l pulmonary infiltrates, required intubation and transferred to [facility].  Past Medical History  HTN, HLD, DM  Significant Hospital Events   10/26 admission to ICU, prone position 10/29 fever 11/02 worsening oxygenation, persistent fever >> change ABx 11/05 d/c ABx 11/06 atrial fib with RVR 11/07 recurrent fever, change ABx 11/12 start pressure support weaning 1114 Fever 103.1, adjust ABx for HCAP 11/17 Trach 11/19 Restarted on paralytics due to vent dyynchrony, 1 unit PRBC for low Hb 11/20 off paralytics  Consults:    Procedures:  10/26 ETT > 11/17 Trach (JY) 11/17>>> 10/26 L subclavian CVL > 11/4 L IJ 11/17>>   Significant Diagnostic Tests:  Doppler legs b/l 11/01 >> no DVT Echo 11/05 >> EF 60 to 65%, grade 1 DD, mild LVH  Micro Data:  Blood 10/29 >> negative Sputum 10/30 >> MSSA, Pseudomonas Sputum 11/07 >> Pseudomonas, pansensitive  Sputum 11/14 >> Pseudomonas, now with resistance   COVID Therapy:  Remdesivir 10/26 >> 10/30 Decadron 10/26 >> 11/04 Convalescent plasma 10/26  Antibiotics:  Zosyn 10/30 >>11/02 Vancomycin 10/30 >> 10/31 Fortaz 11/02 >> 11/05 Cefepime 11/07 >> off Vancomycin 11/14 >> off Cipro 11/16>>>  Interim history/subjective:  PEEP/FiO2 weaning down.  Still on high dose sedation  Objective   Blood pressure (!) 144/73, pulse 92, temperature 100 F (37.8 C), temperature source Oral, resp. rate (!) 35, height 5\' 11"  (1.803 m), weight 105.7 kg, SpO2 98 %.    Vent Mode: PRVC FiO2 (%):  [40 %] 40 % Set Rate:  [35 bmp] 35 bmp Vt Set:  [600 mL] 600 mL PEEP:  [5 cmH20]  5 cmH20 Plateau Pressure:  [24 cmH20-32 cmH20] 31 cmH20   Intake/Output Summary (Last 24 hours) at 04/21/2019 04/23/2019 Last data filed at 04/21/2019 0800 Gross per 24 hour  Intake 3276.73 ml  Output 2595 ml  Net 681.73 ml   Filed Weights   04/19/19 0500 04/20/19 0500 04/21/19 0130  Weight: 110.5 kg 107.6 kg 105.7 kg    Examination: Gen:      No acute distress HEENT:  EOMI, sclera anicteric Neck:     No masses; no thyromegaly, ETT Lungs:    Rhonci CV:         Regular rate and rhythm; no murmurs Abd:      + bowel sounds; soft, non-tender; no palpable masses, no distension Ext:    No edema; adequate peripheral perfusion Skin:      Warm and dry; no rash Neuro: Sedated  Resolved Hospital Problem list   Septic shock, MSSA HCAP, Hypernatremia  Assessment & Plan:   Acute hypoxic, hypercapnic respiratory failure with ARDS from COVID 19 pneumonia. S/p trach On minimal vent settings now. Will attempt pressure support weans Follow intermittent chest x-ray Monitor trach site for bleeding.  Pseudomonal HCAP, ventilator associated pneumonia Leucocytosis Appreciate ID input. Reccomended ciprofloxacin for 7 days Has persistent fevers with WBC count increasing again Check repeat cultures. Remove CVL  Positive cumulative fluid balance Lasix for diuresis.  New onset A fib with RVR> now in NSR Hx of HTN, HLD. Continue amiodarone, aspirin, Crestor, Eliquis Heparin gtt held due trach bleeding.  Will switch to lovenox for prophylaxis Does not need full anticoagulation as he is in NSR.  Hyperkalemia, metabolic alkalosis. Continue diuresis S/p Diamox x 1  Anemia of critical illness. S/p 1 unit PRBC Monitor CBC.  Moderate protein calorie malnutrition. Continue tube feeds Will likely require PEG placement but will hold off for now  DM type II. SSI with Lantus  Goals of care Wife Melissa updated daily. Last updated 11/22 Code status is DNR  Best practice:  Diet: Tube feeds DVT  prophylaxis: Lovenox GI prophylaxis: protonix Mobility: bed rest Code Status: DNR Family: See above Disposition: remain in ICU  Labs    CMP Latest Ref Rng & Units 04/21/2019 04/20/2019 04/19/2019  Glucose 70 - 99 mg/dL 200(H) 153(H) 166(H)  BUN 6 - 20 mg/dL 57(H) 55(H) 55(H)  Creatinine 0.61 - 1.24 mg/dL 0.95 1.14 1.00  Sodium 135 - 145 mmol/L 141 138 139  Potassium 3.5 - 5.1 mmol/L 3.8 4.0 3.4(L)  Chloride 98 - 111 mmol/L 98 93(L) 89(L)  CO2 22 - 32 mmol/L 32 33(H) 37(H)  Calcium 8.9 - 10.3 mg/dL 8.6(L) 8.4(L) 8.5(L)  Total Protein 6.5 - 8.1 g/dL - - -  Total Bilirubin 0.3 - 1.2 mg/dL - - -  Alkaline Phos 38 - 126 U/L - - -  AST 15 - 41 U/L - - -  ALT 0 - 44 U/L - - -    CBC Latest Ref Rng & Units 04/21/2019 04/20/2019 04/19/2019  WBC 4.0 - 10.5 K/uL 26.8(H) 19.7(H) 17.1(H)  Hemoglobin 13.0 - 17.0 g/dL 8.1(L) 7.9(L) 7.7(L)  Hematocrit 39.0 - 52.0 % 26.9(L) 26.1(L) 25.0(L)  Platelets 150 - 400 K/uL 643(H) 518(H) 440(H)    ABG    Component Value Date/Time   PHART 7.426 04/18/2019 0407   PCO2ART 59.7 (H) 04/18/2019 0407   PO2ART 67.0 (L) 04/18/2019 0407   HCO3 39.3 (H) 04/18/2019 0407   TCO2 41 (H) 04/18/2019 0407   ACIDBASEDEF 3.0 (H) 2019/04/05 1442   O2SAT 93.0 04/18/2019 0407    CBG (last 3)  Recent Labs    04/20/19 2332 04/21/19 0329 04/21/19 0801  GLUCAP 185* 163* 229*   The patient is critically ill with multiple organ system failure and requires high complexity decision making for assessment and support, frequent evaluation and titration of therapies, advanced monitoring, review of radiographic studies and interpretation of complex data.   Critical Care Time devoted to patient care services, exclusive of separately billable procedures, described in this note is 35  minutes.   Marshell Garfinkel MD [place] [facility] Pager [phone] If no answer call [phone] 04/21/2019, 8:28 AM

## 2019-04-22 LAB — CBC
HCT: 30.2 % — ABNORMAL LOW (ref 39.0–52.0)
Hemoglobin: 9 g/dL — ABNORMAL LOW (ref 13.0–17.0)
MCH: 26.6 pg (ref 26.0–34.0)
MCHC: 29.8 g/dL — ABNORMAL LOW (ref 30.0–36.0)
MCV: 89.3 fL (ref 80.0–100.0)
Platelets: 859 10*3/uL — ABNORMAL HIGH (ref 150–400)
RBC: 3.38 MIL/uL — ABNORMAL LOW (ref 4.22–5.81)
RDW: 16 % — ABNORMAL HIGH (ref 11.5–15.5)
WBC: 27.5 10*3/uL — ABNORMAL HIGH (ref 4.0–10.5)
nRBC: 0.3 % — ABNORMAL HIGH (ref 0.0–0.2)

## 2019-04-22 LAB — GLUCOSE, CAPILLARY
Glucose-Capillary: 218 mg/dL — ABNORMAL HIGH (ref 70–99)
Glucose-Capillary: 211 mg/dL — ABNORMAL HIGH (ref 70–99)
Glucose-Capillary: 315 mg/dL — ABNORMAL HIGH (ref 70–99)
Glucose-Capillary: 281 mg/dL — ABNORMAL HIGH (ref 70–99)
Glucose-Capillary: 234 mg/dL — ABNORMAL HIGH (ref 70–99)
Glucose-Capillary: 187 mg/dL — ABNORMAL HIGH (ref 70–99)

## 2019-04-22 LAB — URINE CULTURE: Culture: NO GROWTH

## 2019-04-22 LAB — BASIC METABOLIC PANEL
Anion gap: 13 (ref 5–15)
BUN: 56 mg/dL — ABNORMAL HIGH (ref 6–20)
CO2: 33 mmol/L — ABNORMAL HIGH (ref 22–32)
Calcium: 8.8 mg/dL — ABNORMAL LOW (ref 8.9–10.3)
Chloride: 100 mmol/L (ref 98–111)
Creatinine, Ser: 0.91 mg/dL (ref 0.61–1.24)
GFR calc Af Amer: >60 mL/min (ref >60)
GFR calc non Af Amer: >60 mL/min (ref >60)
Glucose, Bld: 206 mg/dL — ABNORMAL HIGH (ref 70–99)
Potassium: 3.4 mmol/L — ABNORMAL LOW (ref 3.5–5.1)
Sodium: 146 mmol/L — ABNORMAL HIGH (ref 135–145)

## 2019-04-22 LAB — POCT I-STAT 7, (LYTES, BLD GAS, ICA,H+H)
Acid-Base Excess: 12 mmol/L — ABNORMAL HIGH (ref 0.0–2.0)
Bicarbonate: 37.1 mmol/L — ABNORMAL HIGH (ref 20.0–28.0)
Calcium, Ion: 1.21 mmol/L (ref 1.15–1.40)
HCT: 28 % — ABNORMAL LOW (ref 39.0–52.0)
Hemoglobin: 9.5 g/dL — ABNORMAL LOW (ref 13.0–17.0)
O2 Saturation: 95 %
Patient temperature: 102.8
Potassium: 3.4 mmol/L — ABNORMAL LOW (ref 3.5–5.1)
Sodium: 143 mmol/L (ref 135–145)
TCO2: 39 mmol/L — ABNORMAL HIGH (ref 22–32)
pCO2 arterial: 54.6 mmHg — ABNORMAL HIGH (ref 32.0–48.0)
pH, Arterial: 7.449 (ref 7.350–7.450)
pO2, Arterial: 87 mmHg (ref 83.0–108.0)

## 2019-04-22 LAB — PHOSPHORUS
Phosphorus: 2.9 mg/dL (ref 2.5–4.6)
Phosphorus: 3.7 mg/dL (ref 2.5–4.6)

## 2019-04-22 LAB — MAGNESIUM
Magnesium: 1.7 mg/dL (ref 1.7–2.4)
Magnesium: 1.7 mg/dL (ref 1.7–2.4)

## 2019-04-22 LAB — TRIGLYCERIDES: Triglycerides: 99 mg/dL (ref <150)

## 2019-04-22 MED ORDER — CLONIDINE HCL 0.1 MG PO TABS
0.3000 mg | ORAL_TABLET | Freq: Four times a day (QID) | ORAL | Status: DC
  Administered 2019-04-22 – 2019-04-24 (×7): 0.3 mg
  Filled 2019-04-22 (×7): qty 3

## 2019-04-22 MED ORDER — CLONIDINE HCL 0.1 MG PO TABS
0.3000 mg | ORAL_TABLET | Freq: Three times a day (TID) | ORAL | Status: DC
  Administered 2019-04-22 (×2): 0.3 mg via ORAL
  Filled 2019-04-22 (×2): qty 3

## 2019-04-22 MED ORDER — FENTANYL CITRATE (PF) 100 MCG/2ML IJ SOLN
25.0000 ug | INTRAMUSCULAR | Status: DC | PRN
  Administered 2019-04-23 – 2019-04-24 (×4): 100 ug via INTRAVENOUS
  Administered 2019-04-28: 50 ug via INTRAVENOUS
  Administered 2019-04-29: 100 ug via INTRAVENOUS
  Administered 2019-04-29: 50 ug via INTRAVENOUS
  Administered 2019-04-30 (×3): 100 ug via INTRAVENOUS
  Filled 2019-04-22 (×10): qty 2

## 2019-04-22 MED ORDER — CLONIDINE HCL 0.1 MG PO TABS: 0.4000 mg | ORAL_TABLET | Freq: Three times a day (TID) | ORAL | Status: DC

## 2019-04-22 MED ORDER — JUVEN PO PACK
1.0000 | PACK | Freq: Two times a day (BID) | ORAL | Status: DC
  Administered 2019-04-22 – 2019-04-30 (×13): 1
  Filled 2019-04-22 (×16): qty 1

## 2019-04-22 MED ORDER — TOBRAMYCIN 300 MG/5ML IN NEBU
300.0000 mg | INHALATION_SOLUTION | Freq: Two times a day (BID) | RESPIRATORY_TRACT | Status: DC
  Administered 2019-04-22 – 2019-04-30 (×15): 300 mg via RESPIRATORY_TRACT
  Filled 2019-04-22 (×20): qty 5

## 2019-04-22 MED ORDER — MIDAZOLAM HCL 2 MG/2ML IJ SOLN
1.0000 mg | INTRAMUSCULAR | Status: DC | PRN
  Administered 2019-04-22 – 2019-04-24 (×7): 1 mg via INTRAVENOUS
  Filled 2019-04-22 (×7): qty 2

## 2019-04-22 MED ORDER — OXYCODONE HCL 5 MG PO TABS
5.0000 mg | ORAL_TABLET | Freq: Four times a day (QID) | ORAL | Status: DC
  Administered 2019-04-22 – 2019-04-23 (×4): 5 mg
  Filled 2019-04-22 (×4): qty 1

## 2019-04-22 MED ORDER — CLONIDINE HCL 0.1 MG PO TABS
0.1000 mg | ORAL_TABLET | Freq: Once | ORAL | Status: AC
  Administered 2019-04-22: 0.1 mg via ORAL
  Filled 2019-04-22: qty 1

## 2019-04-22 NOTE — Progress Notes (Signed)
Inpatient Diabetes Program Recommendations  AACE/ADA: New Consensus Statement on Inpatient Glycemic Control (2015)  Target Ranges:  Prepandial:   less than 140 mg/dL      Peak postprandial:   less than 180 mg/dL (1-2 hours)      Critically ill patients:  140 - 180 mg/dL   Lab Results  Component Value Date   GLUCAP 211 (H) 04/22/2019   HGBA1C 7.5 (H) 03/30/2019    Review of Glycemic Control Results for LEVIN, DAGOSTINO (MRN [id number]) as of 04/22/2019 11:20  Ref. Range 04/21/2019 23:06 04/22/2019 03:00 04/22/2019 07:42  Glucose-Capillary Latest Ref Range: 70 - 99 mg/dL 255 (H) 218 (H) 211 (H)   Diabetes history: Type 2 Dm Outpatient Diabetes medications: Lantus 60 units QD, Tradjenta 5 mg QD, Metformin 1000 mg BID Current orders for Inpatient glycemic control: Novolog 14 units Q4H, Novolog 0-20 units Q4H, Lantus 50 units BID  Inpatient Diabetes Program Recommendations:    Consider increasing Lantus 60 units BID and resume Tradjenta 5 mg QD.  Thanks, Bronson Curb, MSN, RNC-OB Diabetes Coordinator [phone] (8a-5p)

## 2019-04-22 NOTE — Plan of Care (Signed)
Pt itubated and sedated, unable to update pt on POC.

## 2019-04-22 NOTE — Progress Notes (Signed)
Nutrition Follow-up RD working remotely.  DOCUMENTATION CODES:   Obesity unspecified  INTERVENTION:   Continue TF via postpyloric Cortrak tube:   Vital 1.5 at 65 ml/h (1560 ml per day)   Pro-stat 30 ml QID   Provides 2740 kcal, 165 gm protein, 1192 ml free water daily  Add Juven BID via tube, each packet provides 80 calories, 8 grams of carbohydrate, 2.5  grams of protein (collagen), 7 grams of L-arginine and 7 grams of L-glutamine; supplement contains CaHMB, Vitamins C, E, B12 and Zinc to promote wound healing.  NUTRITION DIAGNOSIS:   Increased nutrient needs related to acute illness as evidenced by estimated needs.  Ongoing   GOAL:   Provide needs based on ASPEN/SCCM guidelines   Met with TF  MONITOR:   Vent status, Labs, I & O's, TF tolerance  ASSESSMENT:   58 yo male admitted with progressive SOB after testing positive for COVID-19 on 10/9 (symptoms started 10/5). PMH includes DM, HLD.   10/28 Postpyloric Cortrak tube placed  Currently receiving Vital 1.5 at 65 ml/h with Pro-stat 30 ml QID. Tolerating well.  S/P tracheostomy 11/17. Patient remains on ventilator support via trach. MV: 19.8 L/min Temp (24hrs), Avg:101.3 F (38.5 C), Min:99.1 F (37.3 C), Max:103 F (39.4 C)   Labs reviewed. Sodium 146 (H), potassium 3.4 (L) CBG's: 218-211-315  Medications reviewed and include lasix, novolog, lantus, Miralax, Flomax, Florastor, zinc sulfate.  I/O +9.3 L since admission Admit weight 111.4 kg Current weight 105.7 kg Patient with mild non pitting edema to BUE and mild non pitting generalized edema per RN documentation today.  Diet Order:   Diet Order            Diet NPO time specified  Diet effective now              EDUCATION NEEDS:   Not appropriate for education at this time  Skin:  Skin Assessment: Skin Integrity Issues: Skin Integrity Issues:: Stage II Stage II: sacrum Unstageable: upper lip  Last BM:  11/23 type 7  Height:   Ht  Readings from Last 1 Encounters:  04/16/19 5' 11"  (1.803 m)    Weight:   Wt Readings from Last 1 Encounters:  04/22/19 105.7 kg   Admit weight 111.4 kg  Ideal Body Weight:  78.2 kg  BMI:  Body mass index is 32.5 kg/m.  Estimated Nutritional Needs:   Kcal:  2700-2800  Protein:  >/= 156 gm  Fluid:  >/= 1.8 L    Molli Barrows, RD, LDN, [place] Pager [phone] After Hours Pager [phone]

## 2019-04-22 NOTE — Progress Notes (Signed)
NAME:  Angel Stuart, MRN:  [id number], DOB:  05/30/1961, LOS: 28 ADMISSION DATE:  2019/03/27, CONSULTATION DATE:  10/26 REFERRING MD:  Sharon Seller, CHIEF COMPLAINT:  Dyspnea   Brief History   58 yo male developed respiratory symptoms 10/05, and tested positive for COVID 19 on 10/09. Presented to [facility] ER 10/25 with progressive dyspnea. Found to have hypoxia and b/l pulmonary infiltrates, required intubation and transferred to [facility].  Has had a prolonged ICU stay, required tracheostomy on 11/17.  Course complicated by pseudomonas infection and encephalopathy.   Past Medical History  HTN, HLD, DM  Significant Hospital Events   10/26 admission to ICU, prone position 10/29 fever 11/02 worsening oxygenation, persistent fever >> change ABx 11/05 d/c ABx 11/06 atrial fib with RVR 11/07 recurrent fever, change ABx 11/12 start pressure support weaning 1114 Fever 103.1, adjust ABx for HCAP 11/17 Trach 11/19 Restarted on paralytics due to vent dyynchrony, 1 unit PRBC for low Hb 11/20 off paralytics 11/23 stopped dilaudid, started clonidine for precedex withdrawal, weaned on pressure support for 5-6 hours  Consults:   Procedures:  10/26 ETT >11/17 Trach (JY) 11/17>>> 10/26 L subclavian CVL >11/4 L IJ 11/17>> out   Significant Diagnostic Tests:  Doppler legs b/l 11/01 >> no DVT Echo 11/05 >> EF 60 to 65%, grade 1 DD, mild LVH  Micro Data:  Blood 10/29 >> negative Sputum 10/30 >> MSSA, Pseudomonas Sputum 11/07 >>Pseudomonas, pansensitive  Sputum 11/14 >>Pseudomonas, now with resistance   Antimicrobials/COVID Rx  Remdesivir 10/26 >> 10/30 Decadron 10/26 >> 11/04 Convalescent plasma 10/26  Zosyn 10/30 >>11/02 Vancomycin 10/30 >> 10/31 Fortaz 11/02 >> 11/05 Cefepime 11/07 >> off Vancomycin 11/14 >> off Cipro 11/16>>>  Interim history/subjective:  Minimal sedation Weaning on pressure support  Objective   Blood pressure 123/67, pulse (!) 109, temperature  (!) 101 F (38.3 C), temperature source Axillary, resp. rate (!) 39, height 5\' 11"  (1.803 m), weight 105.7 kg, SpO2 100 %.    Vent Mode: PRVC FiO2 (%):  [40 %] 40 % Set Rate:  [35 bmp] 35 bmp Vt Set:  [600 mL] 600 mL PEEP:  [5 cmH20] 5 cmH20 Pressure Support:  [20 cmH20] 20 cmH20 Plateau Pressure:  [19 cmH20-29 cmH20] 23 cmH20   Intake/Output Summary (Last 24 hours) at 04/22/2019 1807 Last data filed at 04/22/2019 1600 Gross per 24 hour  Intake 2076.28 ml  Output 3900 ml  Net -1823.72 ml   Filed Weights   04/20/19 0500 04/21/19 0130 04/22/19 0307  Weight: 107.6 kg 105.7 kg 105.7 kg    Examination:  General:  In bed on vent HENT: NCAT tracheostomy in place PULM: CTA B, vent supported breathing CV: RRR, no mgr GI: BS+, soft, nontender MSK: normal bulk and tone Neuro: eyes open, doesn't follow commands    Resolved Hospital Problem list     Assessment & Plan:  ARDS: oxygenation improved, fibroproliferative phase, slow progress Trach, bleeding around trach Pressure support wean as long as tolerated Process will likely take months to fully recover  Pseudomonas: recurrent respiratory trach infections vs colonization Stop cipro today Start nebulized tobi today, plan 2 weeks  Atrial fibrillation Tele Amiodarone  Anemia without bleeding Monitor for bleeding Transfuse PRBC for Hgb < 7 gm/dL  DM2 SSI Monitor glucose  Encephalopathy: prolonged, ICU delirium, medication not helping with sedation, likely precedex intolerance Stop dilaudid Prn fentanyl Start clonidine for precedex weaning Decrease oxycodone  DNR  Best practice:  Diet: tube feedign Pain/Anxiety/Delirium protocol (if indicated): as above, RASS target  0 VAP protocol (if indicated): yes DVT prophylaxis: lovenox GI prophylaxis: famotidine Glucose control: as above Mobility: passive range of motion Code Status: DNR Family Communication: I updated his wife Melissa for 20 minutes today, she is  very down about the overall situation. I tried to encourage her by telling her stories of other patients who have survived despite being in a very similar situation Disposition: remain in ICU  Labs   CBC: Recent Labs  Lab 04/18/19 0400  04/19/19 0500 04/20/19 0400 04/21/19 0425 04/22/19 0040 04/22/19 0450  WBC 15.0*  --  17.1* 19.7* 26.8*  --  27.5*  HGB 6.3*   < > 7.7* 7.9* 8.1* 9.5* 9.0*  HCT 20.5*   < > 25.0* 26.1* 26.9* 28.0* 30.2*  MCV 91.5  --  88.3 89.1 90.0  --  89.3  PLT 396  --  440* 518* 643*  --  859*   < > = values in this interval not displayed.    Basic Metabolic Panel: Recent Labs  Lab 04/18/19 0400  04/19/19 0500  04/20/19 0400 04/20/19 0415 04/20/19 1712 04/21/19 0425 04/21/19 1644 04/22/19 0040 04/22/19 0450  NA 139   < > 139  --   --  138  --  141  --  143 146*  K 3.5   < > 3.4*  --   --  4.0  --  3.8  --  3.4* 3.4*  CL 92*  --  89*  --   --  93*  --  98  --   --  100  CO2 37*  --  37*  --   --  33*  --  32  --   --  33*  GLUCOSE 150*  --  166*  --   --  153*  --  200*  --   --  206*  BUN 51*  --  55*  --   --  55*  --  57*  --   --  56*  CREATININE 1.00  --  1.00  --   --  1.14  --  0.95  --   --  0.91  CALCIUM 8.4*  --  8.5*  --   --  8.4*  --  8.6*  --   --  8.8*  MG 1.5*  --  1.7   < > 1.8  --  1.8 1.7 1.7  --  1.7  PHOS 2.5  --  3.0   < > 4.3  --  3.5 3.7 3.3  --  2.9   < > = values in this interval not displayed.   GFR: Estimated Creatinine Clearance: 108.2 mL/min (by C-G formula based on SCr of 0.91 mg/dL). Recent Labs  Lab 04/19/19 0500 04/20/19 0400 04/21/19 0425 04/22/19 0450  WBC 17.1* 19.7* 26.8* 27.5*    Liver Function Tests: No results for input(s): AST, ALT, ALKPHOS, BILITOT, PROT, ALBUMIN in the last 168 hours. No results for input(s): LIPASE, AMYLASE in the last 168 hours. No results for input(s): AMMONIA in the last 168 hours.  ABG    Component Value Date/Time   PHART 7.449 04/22/2019 0040   PCO2ART 54.6 (H)  04/22/2019 0040   PO2ART 87.0 04/22/2019 0040   HCO3 37.1 (H) 04/22/2019 0040   TCO2 39 (H) 04/22/2019 0040   ACIDBASEDEF 3.0 (H) 01-16-19 1442   O2SAT 95.0 04/22/2019 0040     Coagulation Profile: No results for input(s): INR, PROTIME in the last 168 hours.  Cardiac  Enzymes: No results for input(s): CKTOTAL, CKMB, CKMBINDEX, TROPONINI in the last 168 hours.  HbA1C: Hgb A1c MFr Bld  Date/Time Value Ref Range Status  03/13/2019 01:00 PM 7.5 (H) 4.8 - 5.6 % Final    Comment:    (NOTE) Pre diabetes:          5.7%-6.4% Diabetes:              >6.4% Glycemic control for   <7.0% adults with diabetes     CBG: Recent Labs  Lab 04/21/19 2306 04/22/19 0300 04/22/19 0742 04/22/19 1141 04/22/19 1518  GLUCAP 255* 218* 211* 315* 281*     Critical care time: 35 minutes    Roselie Awkward, MD [place] [facility] Pager: [phone] Cell: [phone] If no response, call [phone]

## 2019-04-23 ENCOUNTER — Inpatient Hospital Stay (HOSPITAL_COMMUNITY): Payer: PRIVATE HEALTH INSURANCE

## 2019-04-23 LAB — CBC
HCT: 33.5 % — ABNORMAL LOW (ref 39.0–52.0)
Hemoglobin: 10.2 g/dL — ABNORMAL LOW (ref 13.0–17.0)
MCH: 27.6 pg (ref 26.0–34.0)
MCHC: 30.4 g/dL (ref 30.0–36.0)
MCV: 90.5 fL (ref 80.0–100.0)
Platelets: 926 10*3/uL (ref 150–400)
RBC: 3.7 MIL/uL — ABNORMAL LOW (ref 4.22–5.81)
RDW: 15.9 % — ABNORMAL HIGH (ref 11.5–15.5)
WBC: 32 10*3/uL — ABNORMAL HIGH (ref 4.0–10.5)
nRBC: 0.1 % (ref 0.0–0.2)

## 2019-04-23 LAB — URINALYSIS, ROUTINE W REFLEX MICROSCOPIC
Bilirubin Urine: NEGATIVE
Glucose, UA: NEGATIVE mg/dL
Hgb urine dipstick: NEGATIVE
Ketones, ur: NEGATIVE mg/dL
Leukocytes,Ua: NEGATIVE
Nitrite: NEGATIVE
Protein, ur: NEGATIVE mg/dL
Specific Gravity, Urine: 1.015 (ref 1.005–1.030)
pH: 5 (ref 5.0–8.0)

## 2019-04-23 LAB — GLUCOSE, CAPILLARY
Glucose-Capillary: 163 mg/dL — ABNORMAL HIGH (ref 70–99)
Glucose-Capillary: 189 mg/dL — ABNORMAL HIGH (ref 70–99)
Glucose-Capillary: 254 mg/dL — ABNORMAL HIGH (ref 70–99)
Glucose-Capillary: 223 mg/dL — ABNORMAL HIGH (ref 70–99)
Glucose-Capillary: 163 mg/dL — ABNORMAL HIGH (ref 70–99)
Glucose-Capillary: 199 mg/dL — ABNORMAL HIGH (ref 70–99)

## 2019-04-23 LAB — MAGNESIUM
Magnesium: 1.9 mg/dL (ref 1.7–2.4)
Magnesium: 1.9 mg/dL (ref 1.7–2.4)

## 2019-04-23 LAB — PATHOLOGIST SMEAR REVIEW

## 2019-04-23 LAB — PHOSPHORUS
Phosphorus: 4.3 mg/dL (ref 2.5–4.6)
Phosphorus: 3.8 mg/dL (ref 2.5–4.6)

## 2019-04-23 LAB — C DIFFICILE QUICK SCREEN W PCR REFLEX
C Diff antigen: NEGATIVE
C Diff interpretation: NOT DETECTED
C Diff toxin: NEGATIVE

## 2019-04-23 LAB — MRSA PCR SCREENING: MRSA by PCR: NEGATIVE

## 2019-04-23 MED ORDER — OXYCODONE HCL 5 MG PO TABS
5.0000 mg | ORAL_TABLET | Freq: Three times a day (TID) | ORAL | Status: DC
  Administered 2019-04-23 – 2019-04-26 (×9): 5 mg
  Filled 2019-04-23 (×9): qty 1

## 2019-04-23 NOTE — Progress Notes (Signed)
Critical Platelet count 859, expected abnormal lab.

## 2019-04-23 NOTE — Progress Notes (Signed)
Patient with increased labored breathing with respiratory rate up to 49, patient does have periods of increased work of breathing which only are intermittent. This episode of increased RR and labored breathing lasting much longer. Patient medicated with Versed.

## 2019-04-23 NOTE — Progress Notes (Signed)
Assisted tele visit to patient with wife and daughters.  Travin Marik M Shandie Bertz, RN   

## 2019-04-23 NOTE — Progress Notes (Signed)
Updated wife re: POC.

## 2019-04-23 NOTE — Progress Notes (Signed)
NAME:  Angel Stuart, MRN:  [id number], DOB:  12/02/1960, LOS: 29 ADMISSION DATE:  03/16/2019, CONSULTATION DATE:  10/26 REFERRING MD:  Sharon SellerMcClung, CHIEF COMPLAINT:  Dyspnea   Brief History   58 yo male developed respiratory symptoms 10/05, and tested positive for COVID 19 on 10/09. Presented to [facility] ER 10/25 with progressive dyspnea. Found to have hypoxia and b/l pulmonary infiltrates, required intubation and transferred to [facility].  Has had a prolonged ICU stay, required tracheostomy on 11/17.  Course complicated by pseudomonas infection and encephalopathy.  Past Medical History  HTN, HLD, DM  Significant Hospital Events   10/26 admission to ICU, prone position 10/29 fever 11/02 worsening oxygenation, persistent fever >> change ABx 11/05 d/c ABx 11/06 atrial fib with RVR 11/07 recurrent fever, change ABx 11/12 start pressure support weaning 1114 Fever 103.1, adjust ABx for HCAP 11/17 Trach 11/19 Restarted on paralytics due to vent dyynchrony, 1 unit PRBC for low Hb 11/20 off paralytics 11/23 stopped dilaudid, started clonidine for precedex withdrawal, weaned on pressure support for 5-6 hours  Consults:   Procedures:  10/26 ETT >11/17 Trach (JY) 11/17>>> 10/26 L subclavian CVL >11/4 L IJ 11/17>> out   Significant Diagnostic Tests:  Doppler legs b/l 11/01 >> no DVT Echo 11/05 >> EF 60 to 65%, grade 1 DD, mild LVH  Micro Data:  Blood 10/29 >> negative Sputum 10/30 >> MSSA, Pseudomonas Sputum 11/07 >>Pseudomonas, pansensitive  Sputum 11/14 >>Pseudomonas, now with resistance   Antimicrobials/COVID Rx  Remdesivir 10/26 >> 10/30 Decadron 10/26 >> 11/04 Convalescent plasma 10/26  Zosyn 10/30 >>11/02 Vancomycin 10/30 >> 10/31 Fortaz 11/02 >> 11/05 Cefepime 11/07 >> off Vancomycin 11/14 >> off Cipro 11/16>>>  Interim history/subjective:  Off precedex Still has problems with tachypnea Followed commands for nursing this morning  Objective    Blood pressure (!) 158/95, pulse (!) 108, temperature 100 F (37.8 C), temperature source Axillary, resp. rate (!) 35, height 5\' 11"  (1.803 m), weight 95.3 kg, SpO2 100 %.    Vent Mode: PRVC FiO2 (%):  [40 %] 40 % Set Rate:  [35 bmp] 35 bmp Vt Set:  [600 mL] 600 mL PEEP:  [5 cmH20] 5 cmH20 Plateau Pressure:  [23 cmH20-34 cmH20] 31 cmH20   Intake/Output Summary (Last 24 hours) at 04/23/2019 1049 Last data filed at 04/23/2019 [id number] Gross per 24 hour  Intake 2732.29 ml  Output 3265 ml  Net -532.71 ml   Filed Weights   04/21/19 0130 04/22/19 0307 04/23/19 0449  Weight: 105.7 kg 105.7 kg 95.3 kg    Examination:  General:  In bed on vent, tachypnea HENT: NCAT tracheostomy in place PULM: CTA B, vent supported breathing CV: RRR, no mgr GI: BS+, soft, nontender MSK: normal bulk and tone Neuro: eyes open, doesn't follow commands for me but has made some movement of hands and feet spontaneously  Resolved Hospital Problem list     Assessment & Plan:  ARDS: oxygenation improved, fibroproliferative phase, slow progress, suspect this will take weeks to recover Trach, bleeding around trach  Continue pressure support weaning multiple times per day VAP prevention Trach care per routine  Pseudomonas: recurrent respiratory trach infections vs colonization Nebulized tobramycin for 2 weeks  Rising WBC with fevers, multiple recent blood cultures negative Check CXR Check U/A Check c.diff  Atrial fibrillation Tele amiodarone  Anemia without bleeding Monitor for bleeding Transfuse PRBC for Hgb < 7 gm/dL  DM2 SSI glucose  Encephalopathy: prolonged, ICU delirium, medication not helping with sedation, likely precedex intolerance  Continue clonidine Decrease oxycodone again Hold sedatives Tolerate tachypnea, sedation doesn't help it  DNR  Best practice:  Diet: tube feedign Pain/Anxiety/Delirium protocol (if indicated): as above, RASS target 0 VAP protocol (if indicated):  yes DVT prophylaxis: lovenox GI prophylaxis: famotidine Glucose control: as above Mobility: passive range of motion Code Status: DNR Family Communication:Updated his wife by video call in ICU today Disposition: remain in ICU  Labs   CBC: Recent Labs  Lab 04/19/19 0500 04/20/19 0400 04/21/19 0425 04/22/19 0040 04/22/19 0450 04/23/19 0520  WBC 17.1* 19.7* 26.8*  --  27.5* 32.0*  HGB 7.7* 7.9* 8.1* 9.5* 9.0* 10.2*  HCT 25.0* 26.1* 26.9* 28.0* 30.2* 33.5*  MCV 88.3 89.1 90.0  --  89.3 90.5  PLT 440* 518* 643*  --  859* 926*    Basic Metabolic Panel: Recent Labs  Lab 04/18/19 0400  04/19/19 0500  04/20/19 0415  04/21/19 0425 04/21/19 1644 04/22/19 0040 04/22/19 0450 04/22/19 1645 04/23/19 0520  NA 139   < > 139  --  138  --  141  --  143 146*  --   --   K 3.5   < > 3.4*  --  4.0  --  3.8  --  3.4* 3.4*  --   --   CL 92*  --  89*  --  93*  --  98  --   --  100  --   --   CO2 37*  --  37*  --  33*  --  32  --   --  33*  --   --   GLUCOSE 150*  --  166*  --  153*  --  200*  --   --  206*  --   --   BUN 51*  --  55*  --  55*  --  57*  --   --  56*  --   --   CREATININE 1.00  --  1.00  --  1.14  --  0.95  --   --  0.91  --   --   CALCIUM 8.4*  --  8.5*  --  8.4*  --  8.6*  --   --  8.8*  --   --   MG 1.5*  --  1.7   < >  --    < > 1.7 1.7  --  1.7 1.7 1.9  PHOS 2.5  --  3.0   < >  --    < > 3.7 3.3  --  2.9 3.7 4.3   < > = values in this interval not displayed.   GFR: Estimated Creatinine Clearance: 103 mL/min (by C-G formula based on SCr of 0.91 mg/dL). Recent Labs  Lab 04/20/19 0400 04/21/19 0425 04/22/19 0450 04/23/19 0520  WBC 19.7* 26.8* 27.5* 32.0*    Liver Function Tests: No results for input(s): AST, ALT, ALKPHOS, BILITOT, PROT, ALBUMIN in the last 168 hours. No results for input(s): LIPASE, AMYLASE in the last 168 hours. No results for input(s): AMMONIA in the last 168 hours.  ABG    Component Value Date/Time   PHART 7.449 04/22/2019 0040   PCO2ART  54.6 (H) 04/22/2019 0040   PO2ART 87.0 04/22/2019 0040   HCO3 37.1 (H) 04/22/2019 0040   TCO2 39 (H) 04/22/2019 0040   ACIDBASEDEF 3.0 (H) 04-24-2019 1442   O2SAT 95.0 04/22/2019 0040     Coagulation Profile: No results for input(s): INR, PROTIME in the last 168  hours.  Cardiac Enzymes: No results for input(s): CKTOTAL, CKMB, CKMBINDEX, TROPONINI in the last 168 hours.  HbA1C: Hgb A1c MFr Bld  Date/Time Value Ref Range Status  03/13/2019 01:00 PM 7.5 (H) 4.8 - 5.6 % Final    Comment:    (NOTE) Pre diabetes:          5.7%-6.4% Diabetes:              >6.4% Glycemic control for   <7.0% adults with diabetes     CBG: Recent Labs  Lab 04/22/19 1518 04/22/19 1954 04/22/19 2324 04/23/19 0329 04/23/19 0736  GLUCAP 281* 234* 187* 163* 189*     Critical care time 40 minutes    Heber [place], MD [place] [facility] Pager: [phone] Cell: [phone] If no response, call [phone]

## 2019-04-24 ENCOUNTER — Inpatient Hospital Stay (HOSPITAL_COMMUNITY): Payer: PRIVATE HEALTH INSURANCE

## 2019-04-24 DIAGNOSIS — R509 Fever, unspecified: Secondary | ICD-10-CM

## 2019-04-24 LAB — CULTURE, RESPIRATORY W GRAM STAIN: Special Requests: NORMAL

## 2019-04-24 LAB — BASIC METABOLIC PANEL
Anion gap: 13 (ref 5–15)
Anion gap: 12 (ref 5–15)
Anion gap: 10 (ref 5–15)
BUN: 83 mg/dL — ABNORMAL HIGH (ref 6–20)
BUN: 98 mg/dL — ABNORMAL HIGH (ref 6–20)
BUN: 96 mg/dL — ABNORMAL HIGH (ref 6–20)
CO2: 39 mmol/L — ABNORMAL HIGH (ref 22–32)
CO2: 38 mmol/L — ABNORMAL HIGH (ref 22–32)
CO2: 38 mmol/L — ABNORMAL HIGH (ref 22–32)
Calcium: 9 mg/dL (ref 8.9–10.3)
Calcium: 9 mg/dL (ref 8.9–10.3)
Calcium: 8.9 mg/dL (ref 8.9–10.3)
Chloride: 103 mmol/L (ref 98–111)
Chloride: 109 mmol/L (ref 98–111)
Chloride: 111 mmol/L (ref 98–111)
Creatinine, Ser: 0.98 mg/dL (ref 0.61–1.24)
Creatinine, Ser: 1.02 mg/dL (ref 0.61–1.24)
Creatinine, Ser: 1.08 mg/dL (ref 0.61–1.24)
GFR calc Af Amer: >60 mL/min (ref >60)
GFR calc Af Amer: >60 mL/min (ref >60)
GFR calc Af Amer: >60 mL/min (ref >60)
GFR calc non Af Amer: >60 mL/min (ref >60)
GFR calc non Af Amer: >60 mL/min (ref >60)
GFR calc non Af Amer: >60 mL/min (ref >60)
Glucose, Bld: 175 mg/dL — ABNORMAL HIGH (ref 70–99)
Glucose, Bld: 88 mg/dL (ref 70–99)
Glucose, Bld: 149 mg/dL — ABNORMAL HIGH (ref 70–99)
Potassium: 2.6 mmol/L — CL (ref 3.5–5.1)
Potassium: 3.5 mmol/L (ref 3.5–5.1)
Potassium: 4.4 mmol/L (ref 3.5–5.1)
Sodium: 155 mmol/L — ABNORMAL HIGH (ref 135–145)
Sodium: 159 mmol/L — ABNORMAL HIGH (ref 135–145)
Sodium: 159 mmol/L — ABNORMAL HIGH (ref 135–145)

## 2019-04-24 LAB — FERRITIN: Ferritin: 1162 ng/mL — ABNORMAL HIGH (ref 24–336)

## 2019-04-24 LAB — GLUCOSE, CAPILLARY
Glucose-Capillary: 152 mg/dL — ABNORMAL HIGH (ref 70–99)
Glucose-Capillary: 97 mg/dL (ref 70–99)
Glucose-Capillary: 206 mg/dL — ABNORMAL HIGH (ref 70–99)
Glucose-Capillary: 89 mg/dL (ref 70–99)
Glucose-Capillary: 144 mg/dL — ABNORMAL HIGH (ref 70–99)
Glucose-Capillary: 184 mg/dL — ABNORMAL HIGH (ref 70–99)

## 2019-04-24 LAB — CBC
HCT: 34 % — ABNORMAL LOW (ref 39.0–52.0)
Hemoglobin: 9.8 g/dL — ABNORMAL LOW (ref 13.0–17.0)
MCH: 26.6 pg (ref 26.0–34.0)
MCHC: 28.8 g/dL — ABNORMAL LOW (ref 30.0–36.0)
MCV: 92.4 fL (ref 80.0–100.0)
Platelets: 1052 10*3/uL (ref 150–400)
RBC: 3.68 MIL/uL — ABNORMAL LOW (ref 4.22–5.81)
RDW: 16 % — ABNORMAL HIGH (ref 11.5–15.5)
WBC: 28.8 10*3/uL — ABNORMAL HIGH (ref 4.0–10.5)
nRBC: 0 % (ref 0.0–0.2)

## 2019-04-24 LAB — MAGNESIUM
Magnesium: 2.1 mg/dL (ref 1.7–2.4)
Magnesium: 2.3 mg/dL (ref 1.7–2.4)

## 2019-04-24 LAB — PHOSPHORUS
Phosphorus: 2.9 mg/dL (ref 2.5–4.6)
Phosphorus: 3 mg/dL (ref 2.5–4.6)

## 2019-04-24 LAB — SAR COV2 SEROLOGY (COVID19)AB(IGG),IA: SARS-CoV-2 Ab, IgG: REACTIVE — AB

## 2019-04-24 LAB — LACTATE DEHYDROGENASE: LDH: 605 U/L — ABNORMAL HIGH (ref 98–192)

## 2019-04-24 LAB — C-REACTIVE PROTEIN: CRP: 4.7 mg/dL — ABNORMAL HIGH (ref <1.0)

## 2019-04-24 LAB — D-DIMER, QUANTITATIVE: D-Dimer, Quant: 1.85 ug/mL-FEU — ABNORMAL HIGH (ref 0.00–0.50)

## 2019-04-24 MED ORDER — POTASSIUM CHLORIDE 20 MEQ/15ML (10%) PO SOLN: 40.0000 meq | ORAL | Status: DC

## 2019-04-24 MED ORDER — CLONIDINE HCL 0.1 MG PO TABS
0.3000 mg | ORAL_TABLET | Freq: Two times a day (BID) | ORAL | Status: AC
  Administered 2019-04-25 (×2): 0.3 mg via ORAL
  Filled 2019-04-24 (×2): qty 3

## 2019-04-24 MED ORDER — CLONIDINE HCL 0.1 MG PO TABS
0.3000 mg | ORAL_TABLET | Freq: Every day | ORAL | Status: AC
  Administered 2019-04-26: 0.3 mg via ORAL
  Filled 2019-04-24: qty 3

## 2019-04-24 MED ORDER — CLONIDINE HCL 0.1 MG PO TABS
0.3000 mg | ORAL_TABLET | Freq: Three times a day (TID) | ORAL | Status: AC
  Administered 2019-04-24 – 2019-04-25 (×3): 0.3 mg via ORAL
  Filled 2019-04-24 (×3): qty 3

## 2019-04-24 MED ORDER — ARTIFICIAL TEARS OPHTHALMIC OINT
TOPICAL_OINTMENT | OPHTHALMIC | Status: DC | PRN
  Administered 2019-04-24: 18:00:00 via OPHTHALMIC
  Filled 2019-04-24: qty 3.5

## 2019-04-24 MED ORDER — FREE WATER
300.0000 mL | Status: DC
  Administered 2019-04-24 – 2019-04-25 (×6): 300 mL

## 2019-04-24 MED ORDER — IOHEXOL 300 MG/ML  SOLN
100.0000 mL | Freq: Once | INTRAMUSCULAR | Status: AC | PRN
  Administered 2019-04-24: 100 mL via INTRAVENOUS

## 2019-04-24 MED ORDER — POTASSIUM CHLORIDE 20 MEQ/15ML (10%) PO SOLN
40.0000 meq | ORAL | Status: AC
  Administered 2019-04-24 (×3): 40 meq
  Filled 2019-04-24 (×3): qty 30

## 2019-04-24 NOTE — Progress Notes (Signed)
[facility] Progress Note Patient Name: Angel Stuart DOB: May 10, 1961 MRN: [id number]   Date of Service  04/24/2019  HPI/Events of Note  K+ 2.6  eICU Interventions  KCL 40 meq  Via tube Q 4 hours x 3 doses        Kerry Kass Asahel Risden 04/24/2019, 6:35 AM

## 2019-04-24 NOTE — Progress Notes (Signed)
Wife updated on POC at 1110

## 2019-04-24 NOTE — Progress Notes (Signed)
Transported to CT and back to the ICU on current vent settings.  No complications noted.

## 2019-04-24 NOTE — Progress Notes (Signed)
Upon receiving patient, RR up to 47, with labored breathing, PO2 sat 95-98%. Patient continues to have very labored breathing with rates in the upper 40's, and high pressures on vent. Patient also has temp and not due for Tylenol yet. Patient medicated per MAR with Fentanyl and Versed, ice packs applied to patient. Patient did have decrease in RR and respiratory pattern appeared less labored after medication.

## 2019-04-24 NOTE — Progress Notes (Signed)
NAME:  Angel Stuart, MRN:  [id number], DOB:  1961/05/19, LOS: 26 ADMISSION DATE:  2019/03/31, CONSULTATION DATE:  10/26 REFERRING MD:  Thereasa Solo, CHIEF COMPLAINT:  Dyspnea   Brief History   58 yo male developed respiratory symptoms 10/05, and tested positive for COVID 19 on 10/09. Presented to [facility] ER 10/25 with progressive dyspnea. Found to have hypoxia and b/l pulmonary infiltrates, required intubation and transferred to [facility].  Has had a prolonged ICU stay, required tracheostomy on 11/17.  Course complicated by pseudomonas infection and encephalopathy.  Past Medical History  HTN, HLD, DM  Significant Hospital Events   10/26 admission to ICU, prone position 10/29 fever 11/02 worsening oxygenation, persistent fever >> change ABx 11/05 d/c ABx 11/06 atrial fib with RVR 11/07 recurrent fever, change ABx 11/12 start pressure support weaning 1114 Fever 103.1, adjust ABx for HCAP 11/17 Trach 11/19 Restarted on paralytics due to vent dyynchrony, 1 unit PRBC for low Hb 11/20 off paralytics 11/23 stopped dilaudid, started clonidine for precedex withdrawal, weaned on pressure support for 5-6 hours  Consults:   Procedures:  10/26 ETT >11/17 Trach (JY) 11/17>>> 10/26 L subclavian CVL >11/4 L IJ 11/17>> out   Significant Diagnostic Tests:  Doppler legs b/l 11/01 >> no DVT Echo 11/05 >> EF 60 to 65%, grade 1 DD, mild LVH CT head/chest/ab/pelvis 11/25 >>>  Micro Data:  Blood 10/29 >> negative Sputum 10/30 >> MSSA, Pseudomonas Sputum 11/07 >>Pseudomonas, pansensitive  Sputum 11/14 >>Pseudomonas, cephalosporin resistance  Blood 11/22 > NGTD Resp 11/22 > pseudomonas Urine 11/22 > NGTD c diff 11/24 > negative  Antimicrobials/COVID Rx  Remdesivir 10/26 >> 10/30 Decadron 10/26 >> 11/04 Convalescent plasma 10/26  Zosyn 10/30 >>11/02 Vancomycin 10/30 >> 10/31 Fortaz 11/02 >> 11/05 Cefepime 11/07 >> off Vancomycin 11/14 >> off Cipro 11/16>> 11/23 Inhaled  Tobi 11/23 >>   Interim history/subjective:  Off precedex Doesn't follow commands Tachypnea persists, some improvement with fentanyl and versed Still has fever  Objective   Blood pressure 103/65, pulse (!) 121, temperature (!) 100.4 F (38 C), temperature source Axillary, resp. rate (!) 31, height 5\' 11"  (1.803 m), weight 95.4 kg, SpO2 98 %.    Vent Mode: PRVC FiO2 (%):  [40 %] 40 % Set Rate:  [20 bmp] 20 bmp Vt Set:  [600 mL] 600 mL PEEP:  [5 cmH20] 5 cmH20 Plateau Pressure:  [22 [id number] cmH20] 22 cmH20   Intake/Output Summary (Last 24 hours) at 04/24/2019 1002 Last data filed at 04/24/2019 0800 Gross per 24 hour  Intake 1867.92 ml  Output 2930 ml  Net -1062.08 ml   Filed Weights   04/22/19 0307 04/23/19 0449 04/24/19 0402  Weight: 105.7 kg 95.3 kg 95.4 kg    Examination:  General:  In bed on vent, tachypnea HENT: NCAT tracheostomy in place PULM: CTA B, vent supported breathing CV: RRR, no mgr GI: BS+, soft, nontender MSK: diminished bulk/tone Neuro: will open eyes spontaneously, no extremity movements for me today    Resolved Hospital Problem list     Assessment & Plan:  ARDS: oxygenation improved, fibroproliferative phase, slow progress, suspect this will take weeks to recover Trach, bleeding around trach > resolved Continue pressure support weaning multiple times per day VAP prevention Trach care per routine  Pseudomonas: recurrent respiratory trach infections vs colonization Plan nebulized tobi bid for 2 weeks  Elevated WBC with fevers, blood culture neg, c diff neg, u/a clean Check CT head/chest/ab/pelvis (abscess? Cholecystitis? Sinusitis?) Post inflammatory syndrome (multi-system inflammatory syndrome? Vasculitis?) Hold  antibiotics Check inflammatory markers CRP, LDH, Ferritin Check SARS COV 2 Ab  Atrial fibrillation: now Tele Stop amiodarone  Anemia without bleeding Thrombocytosis: inflammatory reaction? Monitor for bleeding Transfuse  PRBC for Hgb < 7 gm/dL  Hypernatremia Azotemia Hold lasix Free water, increase Repeat BMET  DM2 SSI Monitor glucose  Encephalopathy: prolonged, ICU delirium, medication not helping with sedation, possibly precedex related Wean off clonidine this week  Continue prn fentanyl/versed Wean off oxycodone this week Tolerate tachypnea, have seen this with many COVID patients.     DNR   Best practice:  Diet: tube feedign Pain/Anxiety/Delirium protocol (if indicated): as above, RASS target 0 VAP protocol (if indicated): yes DVT prophylaxis: lovenox GI prophylaxis: famotidine Glucose control: as above Mobility: passive range of motion Code Status: DNR Family Communication: I updated his wife by phone  Disposition: remain in ICU  Labs   CBC: Recent Labs  Lab 04/20/19 0400 04/21/19 0425 04/22/19 0040 04/22/19 0450 04/23/19 0520 04/24/19 0225  WBC 19.7* 26.8*  --  27.5* 32.0* 28.8*  HGB 7.9* 8.1* 9.5* 9.0* 10.2* 9.8*  HCT 26.1* 26.9* 28.0* 30.2* 33.5* 34.0*  MCV 89.1 90.0  --  89.3 90.5 92.4  PLT 518* 643*  --  859* 926* 1,052*    Basic Metabolic Panel: Recent Labs  Lab 04/19/19 0500  04/20/19 0415  04/21/19 0425  04/22/19 0040 04/22/19 0450 04/22/19 1645 04/23/19 0520 04/23/19 1640 04/24/19 0225  NA 139  --  138  --  141  --  143 146*  --   --   --  155*  K 3.4*  --  4.0  --  3.8  --  3.4* 3.4*  --   --   --  2.6*  CL 89*  --  93*  --  98  --   --  100  --   --   --  103  CO2 37*  --  33*  --  32  --   --  33*  --   --   --  39*  GLUCOSE 166*  --  153*  --  200*  --   --  206*  --   --   --  175*  BUN 55*  --  55*  --  57*  --   --  56*  --   --   --  83*  CREATININE 1.00  --  1.14  --  0.95  --   --  0.91  --   --   --  0.98  CALCIUM 8.5*  --  8.4*  --  8.6*  --   --  8.8*  --   --   --  9.0  MG 1.7   < >  --    < > 1.7   < >  --  1.7 1.7 1.9 1.9 2.1  PHOS 3.0   < >  --    < > 3.7   < >  --  2.9 3.7 4.3 3.8 2.9   < > = values in this interval not  displayed.   GFR: Estimated Creatinine Clearance: 95.6 mL/min (by C-G formula based on SCr of 0.98 mg/dL). Recent Labs  Lab 04/21/19 0425 04/22/19 0450 04/23/19 0520 04/24/19 0225  WBC 26.8* 27.5* 32.0* 28.8*    Liver Function Tests: No results for input(s): AST, ALT, ALKPHOS, BILITOT, PROT, ALBUMIN in the last 168 hours. No results for input(s): LIPASE, AMYLASE in the last 168 hours. No results for  input(s): AMMONIA in the last 168 hours.  ABG    Component Value Date/Time   PHART 7.449 04/22/2019 0040   PCO2ART 54.6 (H) 04/22/2019 0040   PO2ART 87.0 04/22/2019 0040   HCO3 37.1 (H) 04/22/2019 0040   TCO2 39 (H) 04/22/2019 0040   ACIDBASEDEF 3.0 (H) 03/26/2019 1442   O2SAT 95.0 04/22/2019 0040     Coagulation Profile: No results for input(s): INR, PROTIME in the last 168 hours.  Cardiac Enzymes: No results for input(s): CKTOTAL, CKMB, CKMBINDEX, TROPONINI in the last 168 hours.  HbA1C: Hgb A1c MFr Bld  Date/Time Value Ref Range Status  26-Mar-2019 01:00 PM 7.5 (H) 4.8 - 5.6 % Final    Comment:    (NOTE) Pre diabetes:          5.7%-6.4% Diabetes:              >6.4% Glycemic control for   <7.0% adults with diabetes     CBG: Recent Labs  Lab 04/23/19 1547 04/23/19 1909 04/23/19 2259 04/24/19 0318 04/24/19 0726  GLUCAP 223* 163* 199* 152* 97     Critical care time 50 minutes    Heber [place], MD [place] [facility] Pager: [phone] Cell: [phone] If no response, call [phone]

## 2019-04-24 NOTE — Progress Notes (Signed)
Contacted pt wife, Zaccheus Edmister, via telephone. Gave update on pt status and POC. Pt wife verbalized understanding and had no further questions.

## 2019-04-25 ENCOUNTER — Inpatient Hospital Stay (HOSPITAL_COMMUNITY): Payer: PRIVATE HEALTH INSURANCE

## 2019-04-25 DIAGNOSIS — Z930 Tracheostomy status: Secondary | ICD-10-CM

## 2019-04-25 DIAGNOSIS — E870 Hyperosmolality and hypernatremia: Secondary | ICD-10-CM

## 2019-04-25 LAB — CBC WITH DIFFERENTIAL/PLATELET
Abs Immature Granulocytes: 0.42 10*3/uL — ABNORMAL HIGH (ref 0.00–0.07)
Basophils Absolute: 0.2 10*3/uL — ABNORMAL HIGH (ref 0.0–0.1)
Basophils Relative: 1 %
Eosinophils Absolute: 0.1 10*3/uL (ref 0.0–0.5)
Eosinophils Relative: 1 %
HCT: 34.5 % — ABNORMAL LOW (ref 39.0–52.0)
Hemoglobin: 9.7 g/dL — ABNORMAL LOW (ref 13.0–17.0)
Immature Granulocytes: 1 %
Lymphocytes Relative: 12 %
Lymphs Abs: 3.6 10*3/uL (ref 0.7–4.0)
MCH: 26.7 pg (ref 26.0–34.0)
MCHC: 28.1 g/dL — ABNORMAL LOW (ref 30.0–36.0)
MCV: 95 fL (ref 80.0–100.0)
Monocytes Absolute: 3 10*3/uL — ABNORMAL HIGH (ref 0.1–1.0)
Monocytes Relative: 10 %
Neutro Abs: 22.1 10*3/uL — ABNORMAL HIGH (ref 1.7–7.7)
Neutrophils Relative %: 75 %
Platelets: 975 10*3/uL (ref 150–400)
RBC: 3.63 MIL/uL — ABNORMAL LOW (ref 4.22–5.81)
RDW: 16.4 % — ABNORMAL HIGH (ref 11.5–15.5)
WBC: 29.4 10*3/uL — ABNORMAL HIGH (ref 4.0–10.5)
nRBC: 0.1 % (ref 0.0–0.2)

## 2019-04-25 LAB — BASIC METABOLIC PANEL
Anion gap: 12 (ref 5–15)
BUN: 99 mg/dL — ABNORMAL HIGH (ref 6–20)
CO2: 37 mmol/L — ABNORMAL HIGH (ref 22–32)
Calcium: 8.7 mg/dL — ABNORMAL LOW (ref 8.9–10.3)
Chloride: 111 mmol/L (ref 98–111)
Creatinine, Ser: 1.21 mg/dL (ref 0.61–1.24)
GFR calc Af Amer: >60 mL/min (ref >60)
GFR calc non Af Amer: >60 mL/min (ref >60)
Glucose, Bld: 288 mg/dL — ABNORMAL HIGH (ref 70–99)
Potassium: 4.2 mmol/L (ref 3.5–5.1)
Sodium: 160 mmol/L — ABNORMAL HIGH (ref 135–145)

## 2019-04-25 LAB — MAGNESIUM
Magnesium: 2.4 mg/dL (ref 1.7–2.4)
Magnesium: 2.5 mg/dL — ABNORMAL HIGH (ref 1.7–2.4)

## 2019-04-25 LAB — POCT I-STAT 7, (LYTES, BLD GAS, ICA,H+H)
Acid-Base Excess: 17 mmol/L — ABNORMAL HIGH (ref 0.0–2.0)
Bicarbonate: 42.5 mmol/L — ABNORMAL HIGH (ref 20.0–28.0)
Calcium, Ion: 1.18 mmol/L (ref 1.15–1.40)
HCT: 30 % — ABNORMAL LOW (ref 39.0–52.0)
Hemoglobin: 10.2 g/dL — ABNORMAL LOW (ref 13.0–17.0)
O2 Saturation: 97 %
Patient temperature: 101.5
Potassium: 4 mmol/L (ref 3.5–5.1)
Sodium: 159 mmol/L — ABNORMAL HIGH (ref 135–145)
TCO2: 44 mmol/L — ABNORMAL HIGH (ref 22–32)
pCO2 arterial: 61.3 mmHg — ABNORMAL HIGH (ref 32.0–48.0)
pH, Arterial: 7.454 — ABNORMAL HIGH (ref 7.350–7.450)
pO2, Arterial: 97 mmHg (ref 83.0–108.0)

## 2019-04-25 LAB — GLUCOSE, CAPILLARY
Glucose-Capillary: 154 mg/dL — ABNORMAL HIGH (ref 70–99)
Glucose-Capillary: 170 mg/dL — ABNORMAL HIGH (ref 70–99)
Glucose-Capillary: 188 mg/dL — ABNORMAL HIGH (ref 70–99)
Glucose-Capillary: 239 mg/dL — ABNORMAL HIGH (ref 70–99)
Glucose-Capillary: 243 mg/dL — ABNORMAL HIGH (ref 70–99)
Glucose-Capillary: 256 mg/dL — ABNORMAL HIGH (ref 70–99)

## 2019-04-25 LAB — PHOSPHORUS
Phosphorus: 3.7 mg/dL (ref 2.5–4.6)
Phosphorus: 4.1 mg/dL (ref 2.5–4.6)

## 2019-04-25 MED ORDER — FREE WATER
400.0000 mL | Status: DC
  Administered 2019-04-25 – 2019-04-26 (×6): 400 mL

## 2019-04-25 MED ORDER — IBUPROFEN 100 MG/5ML PO SUSP
400.0000 mg | Freq: Four times a day (QID) | ORAL | Status: DC | PRN
  Administered 2019-04-25 – 2019-04-26 (×3): 400 mg
  Filled 2019-04-25 (×4): qty 20

## 2019-04-25 MED ORDER — IBUPROFEN 100 MG PO CHEW: 100.0000 mg | CHEWABLE_TABLET | Freq: Four times a day (QID) | ORAL | Status: DC | PRN

## 2019-04-25 MED ORDER — IBUPROFEN 100 MG PO CHEW: 400.0000 mg | CHEWABLE_TABLET | Freq: Four times a day (QID) | ORAL | Status: DC | PRN

## 2019-04-25 NOTE — Plan of Care (Signed)
Note: Pt with urinary retention, straight cath x2 performed with 864ml and 622ml UOP. Pt febrile with tmax 102.3, partial cause is bed warmer that will not turn off despite bed saying it is off. Charge nurse made aware, plan to transfer pt to alternate bed during dayshift. Ice pack placed bilat groin, ibuprofen added prn to tylenol. Tracheostomy care peformed.   Problem: Respiratory: Goal: Will maintain a patent airway Outcome: Progressing   Problem: Clinical Measurements: Goal: Will remain free from infection Outcome: Progressing Goal: Diagnostic test results will improve Outcome: Progressing Goal: Respiratory complications will improve Outcome: Progressing Goal: Cardiovascular complication will be avoided Outcome: Progressing   Problem: Nutrition: Goal: Adequate nutrition will be maintained Outcome: Progressing   Problem: Elimination: Goal: Will not experience complications related to bowel motility Outcome: Progressing   Problem: Pain Managment: Goal: General experience of comfort will improve Outcome: Progressing   Problem: Safety: Goal: Ability to remain free from injury will improve Outcome: Progressing   Problem: Skin Integrity: Goal: Risk for impaired skin integrity will decrease Outcome: Progressing

## 2019-04-25 NOTE — Progress Notes (Signed)
Spoke to pt's wife a great deal over phone. Updated on pts condition and offered support. All questions answered. Pt asked many questions about pt's prognosis and states she has been thinking that it may be getting close to the time she has to" make some big decisions'. Support offered and further communication with MD team encouraged.

## 2019-04-25 NOTE — Progress Notes (Addendum)
NAME:  Deloss Amico, MRN:  [id number], DOB:  February 25, 1961, LOS: 31 ADMISSION DATE:  03/09/2019, CONSULTATION DATE:  10/26 REFERRING MD:  Sharon Seller, CHIEF COMPLAINT:  Dyspnea   Brief History   58 yo male developed respiratory symptoms 10/05, and tested positive for COVID 19 on 10/09. Presented to [facility] ER 10/25 with progressive dyspnea. Found to have hypoxia and b/l pulmonary infiltrates, required intubation and transferred to [facility].  Has had a prolonged ICU stay, required tracheostomy on 11/17.  Course complicated by pseudomonas infection and encephalopathy.  Past Medical History  HTN, HLD, DM  Significant Hospital Events   10/26 admission to ICU, prone position 10/29 fever 11/02 worsening oxygenation, persistent fever >> change ABx 11/05 d/c ABx 11/06 atrial fib with RVR 11/07 recurrent fever, change ABx 11/12 start pressure support weaning 1114 Fever 103.1, adjust ABx for HCAP 11/17 Trach 11/19 Restarted on paralytics due to vent dyynchrony, 1 unit PRBC for low Hb 11/20 off paralytics 11/23 stopped dilaudid, started clonidine for precedex withdrawal, weaned on pressure support for 5-6 hours  Consults:   Procedures:  10/26 ETT >11/17 Janina Mayo Ninetta Lights) 11/17>>> 10/26 L subclavian CVL >11/4 L IJ 11/17>> out   Significant Diagnostic Tests:  Doppler legs b/l 11/01 >> no DVT Echo 11/05 >> EF 60 to 65%, grade 1 DD, mild LVH CT head/chest/ab/pelvis 11/25 >>>  CT chest, abdomen, pelvis 11/25>> new pneumomediastinum, no pneumothorax.  Improving diffuse bilateral GGO.  No focal sources of infection identified. CT head 11/25>> no significant sinusitis, diffuse mild cortical atrophy and chronic white matter changes  Micro Data:  Blood 10/29 >> negative Sputum 10/30 >> MSSA, Pseudomonas Sputum 11/07 >>Pseudomonas, pansensitive  Sputum 11/14 >>Pseudomonas, cephalosporin resistance  Blood 11/22 > NGTD Resp 11/22 > pseudomonas Urine 11/22 > NGTD c diff 11/24 >  negative  Antimicrobials/COVID Rx  Remdesivir 10/26 >> 10/30 Decadron 10/26 >> 11/04 Convalescent plasma 10/26  Zosyn 10/30 >>11/02 Vancomycin 10/30 >> 10/31 Fortaz 11/02 >> 11/05 Cefepime 11/07 >> off Vancomycin 11/14 >> off Cipro 11/16>> 11/23 Inhaled Tobi 11/23 >>   Interim history/subjective:  Off Precedex and sedation, still not following commands, still tachypneic.  Objective   Blood pressure 115/68, pulse (!) 119, temperature (!) 101.2 F (38.4 C), temperature source Oral, resp. rate (!) 38, height 5\' 11"  (1.803 m), weight 95.6 kg, SpO2 100 %.    Vent Mode: PRVC FiO2 (%):  [40 %] 40 % Set Rate:  [20 bmp] 20 bmp Vt Set:  [600 mL] 600 mL PEEP:  [5 cmH20] 5 cmH20 Plateau Pressure:  [23 cmH20-33 cmH20] 23 cmH20   Intake/Output Summary (Last 24 hours) at 04/25/2019 1325 Last data filed at 04/25/2019 1000 Gross per 24 hour  Intake 3185 ml  Output 2600 ml  Net 585 ml   Filed Weights   04/23/19 0449 04/24/19 0402 04/25/19 0500  Weight: 95.3 kg 95.4 kg 95.6 kg    Examination:  General: Critically ill-appearing man laying in bed in no acute distress HENT: [place]/AT, eyes anicteric Neck: Trach in place without drainage or erythema PULM: CTAB, tachypneic, breathing above the set rate on the vent CV: Regular rate and rhythm, no murmur GI: Soft, nontender MSK: Significant atrophy Neuro: Eyes spontaneously open, but not tracking or responding at all to verbal stimulation.  Intact blinking reflex, coughing gag.    Resolved Hospital Problem list     Assessment & Plan:  ARDS: oxygenation improved, fibroproliferative phase, slow progress, suspect this will take weeks to recover Trach, bleeding around trach >  resolved -Continue LTVV, 4-8 cc/kg ideal body weight, goal plateau less than 30 and driving pressure less than 15 -Continue weaning trials as tolerated.  He should not be disqualified based on resting tachypnea alone. -Titrate down FiO2 as able to maintain sats  greater than 90% -Routine trach care -VAP prevention protocol  Pseudomonas: recurrent respiratory trach infections vs colonization -Continue nebulized tobramycin for 2 weeks  Elevated WBC with fevers, blood culture neg, c diff neg, u/a clean-question of hyper inflammatory syndrome related to Covid.  Of antibodies positive.  No new sources of infection identified on pan-CT  -Continue to hold antibiotics and monitor  Atrial fibrillation: now rate controlled -Continue to monitor -Stopping amiodarone  Anemia without bleeding Thrombocytosis: inflammatory reaction? -Continue to monitor -Transfuse for hemoglobin less than 7  Hypernatremia Azotemia -increase free water flushes -Likely has high insensible losses from fevers tachypnea  DM2 -Continue Accu-Cheks every 4 hours with sliding scale insulin as needed -Goal BG 140-180 while admitted to the ICU  Encephalopathy: prolonged, ICU delirium, medication not helping with sedation, possibly precedex related Wean off clonidine this week  -Minimize sedation as much as possible-as needed Versed and fentanyl if required, but has not been eating.  Titrating off clonidine -Continue supportive care.  Avoid delirium genic meds     Best practice:  Diet: tube feedign Pain/Anxiety/Delirium protocol (if indicated): as above, RASS target 0 VAP protocol (if indicated): yes DVT prophylaxis: lovenox GI prophylaxis: famotidine Glucose control: as above Mobility: passive range of motion Code Status: DNR Family Communication: Will update wife Disposition: ICU  Labs   CBC: Recent Labs  Lab 04/21/19 0425  04/22/19 0450 04/23/19 0520 04/24/19 0225 04/25/19 0130 04/25/19 0319  WBC 26.8*  --  27.5* 32.0* 28.8* 29.4*  --   NEUTROABS  --   --   --   --   --  22.1*  --   HGB 8.1*   < > 9.0* 10.2* 9.8* 9.7* 10.2*  HCT 26.9*   < > 30.2* 33.5* 34.0* 34.5* 30.0*  MCV 90.0  --  89.3 90.5 92.4 95.0  --   PLT 643*  --  859* 926* 1,052* 975*  --     < > = values in this interval not displayed.    Basic Metabolic Panel: Recent Labs  Lab 04/22/19 0450  04/23/19 0520 04/23/19 1640 04/24/19 0225 04/24/19 1605 04/24/19 1955 04/25/19 0130 04/25/19 0319  NA 146*  --   --   --  155* 159* 159* 160* 159*  K 3.4*  --   --   --  2.6* 3.5 4.4 4.2 4.0  CL 100  --   --   --  103 109 111 111  --   CO2 33*  --   --   --  39* 38* 38* 37*  --   GLUCOSE 206*  --   --   --  175* 88 149* 288*  --   BUN 56*  --   --   --  83* 98* 96* 99*  --   CREATININE 0.91  --   --   --  0.98 1.02 1.08 1.21  --   CALCIUM 8.8*  --   --   --  9.0 9.0 8.9 8.7*  --   MG 1.7   < > 1.9 1.9 2.1 2.3  --  2.4  --   PHOS 2.9   < > 4.3 3.8 2.9 3.0  --  3.7  --    < > =  values in this interval not displayed.   GFR: Estimated Creatinine Clearance: 77.5 mL/min (by C-G formula based on SCr of 1.21 mg/dL). Recent Labs  Lab 04/22/19 0450 04/23/19 0520 04/24/19 0225 04/25/19 0130  WBC 27.5* 32.0* 28.8* 29.4*    Liver Function Tests: No results for input(s): AST, ALT, ALKPHOS, BILITOT, PROT, ALBUMIN in the last 168 hours. No results for input(s): LIPASE, AMYLASE in the last 168 hours. No results for input(s): AMMONIA in the last 168 hours.  ABG    Component Value Date/Time   PHART 7.454 (H) 04/25/2019 0319   PCO2ART 61.3 (H) 04/25/2019 0319   PO2ART 97.0 04/25/2019 0319   HCO3 42.5 (H) 04/25/2019 0319   TCO2 44 (H) 04/25/2019 0319   ACIDBASEDEF 3.0 (H) 03/15/2019 1442   O2SAT 97.0 04/25/2019 0319     Coagulation Profile: No results for input(s): INR, PROTIME in the last 168 hours.  Cardiac Enzymes: No results for input(s): CKTOTAL, CKMB, CKMBINDEX, TROPONINI in the last 168 hours.  HbA1C: Hgb A1c MFr Bld  Date/Time Value Ref Range Status  03/26/2019 01:00 PM 7.5 (H) 4.8 - 5.6 % Final    Comment:    (NOTE) Pre diabetes:          5.7%-6.4% Diabetes:              >6.4% Glycemic control for   <7.0% adults with diabetes     CBG: Recent Labs  Lab  04/24/19 2349 04/25/19 0341 04/25/19 0644 04/25/19 0752 04/25/19 1139  GLUCAP 243* 256* 239* 188* 170*     This patient is critically ill with multiple organ system failure which requires frequent high complexity decision making, assessment, support, evaluation, and titration of therapies. This was completed through the application of advanced monitoring technologies and extensive interpretation of multiple databases. During this encounter critical care time was devoted to patient care services described in this note for 50 minutes.  Steffanie DunnLaura P Fantasy Donald, DO 04/25/19 1:33 PM [facility]    Wife Melissa updated by phone & all questions were answered.  Steffanie DunnLaura P Virl Coble, DO 04/25/19 5:25 PM [facility]

## 2019-04-26 DIAGNOSIS — A498 Other bacterial infections of unspecified site: Secondary | ICD-10-CM

## 2019-04-26 DIAGNOSIS — E860 Dehydration: Secondary | ICD-10-CM

## 2019-04-26 LAB — BASIC METABOLIC PANEL
Anion gap: 13 (ref 5–15)
BUN: 122 mg/dL — ABNORMAL HIGH (ref 6–20)
CO2: 35 mmol/L — ABNORMAL HIGH (ref 22–32)
Calcium: 8.4 mg/dL — ABNORMAL LOW (ref 8.9–10.3)
Chloride: 110 mmol/L (ref 98–111)
Creatinine, Ser: 1.32 mg/dL — ABNORMAL HIGH (ref 0.61–1.24)
GFR calc Af Amer: >60 mL/min (ref >60)
GFR calc non Af Amer: 59 mL/min — ABNORMAL LOW (ref >60)
Glucose, Bld: 168 mg/dL — ABNORMAL HIGH (ref 70–99)
Potassium: 3.7 mmol/L (ref 3.5–5.1)
Sodium: 158 mmol/L — ABNORMAL HIGH (ref 135–145)

## 2019-04-26 LAB — CULTURE, BLOOD (ROUTINE X 2)
Culture: NO GROWTH
Culture: NO GROWTH
Special Requests: ADEQUATE
Special Requests: ADEQUATE

## 2019-04-26 LAB — GLUCOSE, CAPILLARY
Glucose-Capillary: 122 mg/dL — ABNORMAL HIGH (ref 70–99)
Glucose-Capillary: 137 mg/dL — ABNORMAL HIGH (ref 70–99)
Glucose-Capillary: 145 mg/dL — ABNORMAL HIGH (ref 70–99)
Glucose-Capillary: 147 mg/dL — ABNORMAL HIGH (ref 70–99)
Glucose-Capillary: 166 mg/dL — ABNORMAL HIGH (ref 70–99)
Glucose-Capillary: 169 mg/dL — ABNORMAL HIGH (ref 70–99)
Glucose-Capillary: 210 mg/dL — ABNORMAL HIGH (ref 70–99)

## 2019-04-26 LAB — POCT I-STAT 7, (LYTES, BLD GAS, ICA,H+H)
Acid-Base Excess: 17 mmol/L — ABNORMAL HIGH (ref 0.0–2.0)
Bicarbonate: 41.7 mmol/L — ABNORMAL HIGH (ref 20.0–28.0)
Calcium, Ion: 1.16 mmol/L (ref 1.15–1.40)
HCT: 26 % — ABNORMAL LOW (ref 39.0–52.0)
Hemoglobin: 8.8 g/dL — ABNORMAL LOW (ref 13.0–17.0)
O2 Saturation: 98 %
Patient temperature: 98.9
Potassium: 3.6 mmol/L (ref 3.5–5.1)
Sodium: 158 mmol/L — ABNORMAL HIGH (ref 135–145)
TCO2: 43 mmol/L — ABNORMAL HIGH (ref 22–32)
pCO2 arterial: 48.8 mmHg — ABNORMAL HIGH (ref 32.0–48.0)
pH, Arterial: 7.54 — ABNORMAL HIGH (ref 7.350–7.450)
pO2, Arterial: 94 mmHg (ref 83.0–108.0)

## 2019-04-26 LAB — CBC
HCT: 29.8 % — ABNORMAL LOW (ref 39.0–52.0)
Hemoglobin: 8.4 g/dL — ABNORMAL LOW (ref 13.0–17.0)
MCH: 26.5 pg (ref 26.0–34.0)
MCHC: 28.2 g/dL — ABNORMAL LOW (ref 30.0–36.0)
MCV: 94 fL (ref 80.0–100.0)
Platelets: 861 10*3/uL — ABNORMAL HIGH (ref 150–400)
RBC: 3.17 MIL/uL — ABNORMAL LOW (ref 4.22–5.81)
RDW: 16.5 % — ABNORMAL HIGH (ref 11.5–15.5)
WBC: 22.8 10*3/uL — ABNORMAL HIGH (ref 4.0–10.5)
nRBC: 0.2 % (ref 0.0–0.2)

## 2019-04-26 LAB — PHOSPHORUS
Phosphorus: 3.7 mg/dL (ref 2.5–4.6)
Phosphorus: 3.5 mg/dL (ref 2.5–4.6)

## 2019-04-26 LAB — MAGNESIUM
Magnesium: 2.7 mg/dL — ABNORMAL HIGH (ref 1.7–2.4)
Magnesium: 2.6 mg/dL — ABNORMAL HIGH (ref 1.7–2.4)

## 2019-04-26 MED ORDER — LACTATED RINGERS IV BOLUS
1000.0000 mL | Freq: Once | INTRAVENOUS | Status: AC
  Administered 2019-04-26: 1000 mL via INTRAVENOUS

## 2019-04-26 MED ORDER — TAMSULOSIN HCL 0.4 MG PO CAPS
0.4000 mg | ORAL_CAPSULE | Freq: Once | ORAL | Status: AC
  Administered 2019-04-26: 0.4 mg via ORAL
  Filled 2019-04-26: qty 1

## 2019-04-26 MED ORDER — TAMSULOSIN HCL 0.4 MG PO CAPS
0.8000 mg | ORAL_CAPSULE | Freq: Every day | ORAL | Status: DC
  Administered 2019-04-27 – 2019-04-30 (×3): 0.8 mg via ORAL
  Filled 2019-04-26 (×4): qty 2

## 2019-04-26 MED ORDER — ACETAMINOPHEN 160 MG/5ML PO SOLN
650.0000 mg | Freq: Four times a day (QID) | ORAL | Status: DC
  Administered 2019-04-26 – 2019-04-30 (×10): 650 mg
  Filled 2019-04-26 (×14): qty 20.3

## 2019-04-26 MED ORDER — POTASSIUM CHLORIDE 20 MEQ PO PACK
20.0000 meq | PACK | Freq: Once | ORAL | Status: AC
  Administered 2019-04-26: 20 meq
  Filled 2019-04-26: qty 1

## 2019-04-26 MED ORDER — FREE WATER
250.0000 mL | Status: DC
  Administered 2019-04-26 – 2019-04-30 (×30): 250 mL

## 2019-04-26 NOTE — Progress Notes (Addendum)
NAME:  Angel Stuart, MRN:  [id number], DOB:  1960/07/20, LOS: 32 ADMISSION DATE:  2019-04-20, CONSULTATION DATE:  10/26 REFERRING MD:  Sharon Seller, CHIEF COMPLAINT:  Dyspnea   Brief History   58 yo male developed respiratory symptoms 10/05, and tested positive for COVID 19 on 10/09. Presented to [facility] ER 10/25 with progressive dyspnea. Found to have hypoxia and b/l pulmonary infiltrates, required intubation and transferred to [facility].  Has had a prolonged ICU stay, required tracheostomy on 11/17.  Course complicated by pseudomonas infection and encephalopathy.  Past Medical History  HTN, HLD, DM  Significant Hospital Events   10/26 admission to ICU, prone position 10/29 fever 11/02 worsening oxygenation, persistent fever >> change ABx 11/05 d/c ABx 11/06 atrial fib with RVR 11/07 recurrent fever, change ABx 11/12 start pressure support weaning 1114 Fever 103.1, adjust ABx for HCAP 11/17 Trach 11/19 Restarted on paralytics due to vent dyynchrony, 1 unit PRBC for low Hb 11/20 off paralytics 11/23 stopped dilaudid, started clonidine for precedex withdrawal, weaned on pressure support for 5-6 hours  Consults:   Procedures:  10/26 ETT >11/17 Janina Mayo Ninetta Lights) 11/17>>> 10/26 L subclavian CVL >11/4 L IJ 11/17>> out   Significant Diagnostic Tests:  Doppler legs b/l 11/01 >> no DVT Echo 11/05 >> EF 60 to 65%, grade 1 DD, mild LVH CT head/chest/ab/pelvis 11/25 >>>  CT chest, abdomen, pelvis 11/25>> new pneumomediastinum, no pneumothorax.  Improving diffuse bilateral GGO.  No focal sources of infection identified. CT head 11/25>> no significant sinusitis, diffuse mild cortical atrophy and chronic white matter changes  Micro Data:  Blood 10/29 >> negative Sputum 10/30 >> MSSA, Pseudomonas Sputum 11/07 >>Pseudomonas, pansensitive  Sputum 11/14 >>Pseudomonas, cephalosporin resistance  Blood 11/22 > NGTD Resp 11/22 > pseudomonas Urine 11/22 > NGTD c diff 11/24 >  negative  Antimicrobials/COVID Rx  Remdesivir 10/26 >> 10/30 Decadron 10/26 >> 11/04 Convalescent plasma 10/26  Zosyn 10/30 >>11/02 Vancomycin 10/30 >> 10/31 Fortaz 11/02 >> 11/05 Cefepime 11/07 >> off Vancomycin 11/14 >> off Cipro 11/16>> 11/23 Inhaled Tobi 11/23 >>   Interim history/subjective:  Remains off sedation but not following commands, still tachypneic.  No changes overnight.  Objective   Blood pressure 93/60, pulse (!) 112, temperature 100.2 F (37.9 C), temperature source Oral, resp. rate (!) 35, height 5\' 11"  (1.803 m), weight 95.6 kg, SpO2 95 %.    Vent Mode: PRVC FiO2 (%):  [30 %-40 %] 30 % Set Rate:  [20 bmp] 20 bmp Vt Set:  [600 mL] 600 mL PEEP:  [5 cmH20] 5 cmH20 Plateau Pressure:  [25 cmH20-30 cmH20] 28 cmH20   Intake/Output Summary (Last 24 hours) at 04/26/2019 1416 Last data filed at 04/26/2019 0800 Gross per 24 hour  Intake 1790 ml  Output 1700 ml  Net 90 ml   Filed Weights   04/23/19 0449 04/24/19 0402 04/25/19 0500  Weight: 95.3 kg 95.4 kg 95.6 kg    Examination:  General: Critically ill-appearing man lying in bed in no acute distress HENT: [place]/AT, eyes anicteric.  Oral mucosa moist. Neck: Trach in place without drainage or erythema PULM: Persistent secretions, yellow but thinner.  CTAB.  Tachypneic breathing above the vent, but no accessory muscle use. CV: Mild tachycardia, regular rhythm, no murmur GI: Obese, soft, nontender MSK: Severe atrophy Neuro: Strong cough reflex, intact corneal reflex.  Eyes less open today, but sometimes tracking to both sides of the bed.  Not responsive to verbal stimulation.   Resolved Hospital Problem list  Assessment & Plan:  ARDS: oxygenation improved, fibroproliferative phase, slow progress, suspect this will take weeks to recover Trach, bleeding around trach > resolved -Continue LTVV, 4-8 cc/kg ideal body weight with goal plateau less than 30 and driving pressure less than 15. -Continue weaning  trials as tolerated -Titrate down FiO2 as able to maintain sats greater than 90% -Routine trach care -VAP prevention protocol  -Limit sedation  Pseudomonas: recurrent respiratory trach infections vs colonization -Continue TOBI nebs for 2 weeks  Elevated WBC with fevers, blood culture neg, c diff neg, u/a clean-question of hyper inflammatory syndrome related to Covid.  Of antibodies positive.  No new sources of infection identified on pan-CT  -Scheduled Tylenol to minimize fevers; avoiding NSAIDs due to AKI -Cooling blankets -Continue to monitor for sources of infection, but no new sources have been identified  Atrial fibrillation: now rate- controlled -Continue to monitor off amiodarone  AKI-suspect prerenal due to insensible losses -Volume resuscitation-2 L of LR today; monitor O2 sats -Free water flushes to address hypernatremia -Continue to monitor -Avoid nephrotoxic meds  Anemia without bleeding Thrombocytosis: inflammatory reaction? -Continue to monitor -Transfuse for hemoglobin less than 7 or hemodynamic instability associated with active bleeding  Hypernatremia Azotemia-significantly worsened over the last 2 days -increasing free water flushes -2 L of LR -More aggressive fever control  DM2 -Continue Accu-Cheks every 4 hours with sliding scale insulin as needed -Goal BG 140-180 while admitted to the ICU  Encephalopathy: prolonged ICU delirium, medication not helping with sedation, possibly precedex related  -Minimizing sedation as much as possible-titrating off clonidine.  Discontinue scheduled oxycodone. -Continue supportive care and avoidance of deliriogenic meds     Best practice:  Diet: tube feedign Pain/Anxiety/Delirium protocol (if indicated): as above, RASS target 0 VAP protocol (if indicated): yes DVT prophylaxis: lovenox GI prophylaxis: famotidine Glucose control: as above Mobility: passive range of motion Code Status: DNR Family Communication: Will  update wife Disposition: ICU  Labs   CBC: Recent Labs  Lab 04/22/19 0450 04/23/19 0520 04/24/19 0225 04/25/19 0130 04/25/19 0319 04/26/19 0335 04/26/19 0520  WBC 27.5* 32.0* 28.8* 29.4*  --   --  22.8*  NEUTROABS  --   --   --  22.1*  --   --   --   HGB 9.0* 10.2* 9.8* 9.7* 10.2* 8.8* 8.4*  HCT 30.2* 33.5* 34.0* 34.5* 30.0* 26.0* 29.8*  MCV 89.3 90.5 92.4 95.0  --   --  94.0  PLT 859* 926* 1,052* 975*  --   --  861*    Basic Metabolic Panel: Recent Labs  Lab 04/24/19 0225 04/24/19 1605 04/24/19 1955 04/25/19 0130 04/25/19 0319 04/25/19 1700 04/26/19 0335 04/26/19 0520  NA 155* 159* 159* 160* 159*  --  158* 158*  K 2.6* 3.5 4.4 4.2 4.0  --  3.6 3.7  CL 103 109 111 111  --   --   --  110  CO2 39* 38* 38* 37*  --   --   --  35*  GLUCOSE 175* 88 149* 288*  --   --   --  168*  BUN 83* 98* 96* 99*  --   --   --  122*  CREATININE 0.98 1.02 1.08 1.21  --   --   --  1.32*  CALCIUM 9.0 9.0 8.9 8.7*  --   --   --  8.4*  MG 2.1 2.3  --  2.4  --  2.5*  --  2.7*  PHOS 2.9 3.0  --  3.7  --  4.1  --  3.7   GFR: Estimated Creatinine Clearance: 71.1 mL/min (A) (by C-G formula based on SCr of 1.32 mg/dL (H)). Recent Labs  Lab 04/23/19 0520 04/24/19 0225 04/25/19 0130 04/26/19 0520  WBC 32.0* 28.8* 29.4* 22.8*    Liver Function Tests: No results for input(s): AST, ALT, ALKPHOS, BILITOT, PROT, ALBUMIN in the last 168 hours. No results for input(s): LIPASE, AMYLASE in the last 168 hours. No results for input(s): AMMONIA in the last 168 hours.  ABG    Component Value Date/Time   PHART 7.540 (H) 04/26/2019 0335   PCO2ART 48.8 (H) 04/26/2019 0335   PO2ART 94.0 04/26/2019 0335   HCO3 41.7 (H) 04/26/2019 0335   TCO2 43 (H) 04/26/2019 0335   ACIDBASEDEF 3.0 (H) 03/10/2019 1442   O2SAT 98.0 04/26/2019 0335     Coagulation Profile: No results for input(s): INR, PROTIME in the last 168 hours.  Cardiac Enzymes: No results for input(s): CKTOTAL, CKMB, CKMBINDEX, TROPONINI  in the last 168 hours.  HbA1C: Hgb A1c MFr Bld  Date/Time Value Ref Range Status  03/13/2019 01:00 PM 7.5 (H) 4.8 - 5.6 % Final    Comment:    (NOTE) Pre diabetes:          5.7%-6.4% Diabetes:              >6.4% Glycemic control for   <7.0% adults with diabetes     CBG: Recent Labs  Lab 04/25/19 1605 04/26/19 0041 04/26/19 0423 04/26/19 0747 04/26/19 1233  GLUCAP 154* 210* 147* 137* 145*     This patient is critically ill with multiple organ system failure which requires frequent high complexity decision making, assessment, support, evaluation, and titration of therapies. This was completed through the application of advanced monitoring technologies and extensive interpretation of multiple databases. During this encounter critical care time was devoted to patient care services described in this note for 35 minutes.  Julian Hy, DO 04/26/19 2:16 PM [facility]   Mr. Prochnow's wife [name] was updated by phone.  Julian Hy, DO 04/26/19 6:36 PM [facility]

## 2019-04-26 NOTE — Progress Notes (Signed)
Spoke with ps wife over phone and updated on pts condition. All questions answered.

## 2019-04-27 LAB — BASIC METABOLIC PANEL
Anion gap: 10 (ref 5–15)
BUN: 95 mg/dL — ABNORMAL HIGH (ref 6–20)
CO2: 34 mmol/L — ABNORMAL HIGH (ref 22–32)
Calcium: 8.2 mg/dL — ABNORMAL LOW (ref 8.9–10.3)
Chloride: 110 mmol/L (ref 98–111)
Creatinine, Ser: 0.96 mg/dL (ref 0.61–1.24)
GFR calc Af Amer: >60 mL/min (ref >60)
GFR calc non Af Amer: >60 mL/min (ref >60)
Glucose, Bld: 106 mg/dL — ABNORMAL HIGH (ref 70–99)
Potassium: 3.3 mmol/L — ABNORMAL LOW (ref 3.5–5.1)
Sodium: 154 mmol/L — ABNORMAL HIGH (ref 135–145)

## 2019-04-27 LAB — GLUCOSE, CAPILLARY
Glucose-Capillary: 115 mg/dL — ABNORMAL HIGH (ref 70–99)
Glucose-Capillary: 129 mg/dL — ABNORMAL HIGH (ref 70–99)
Glucose-Capillary: 141 mg/dL — ABNORMAL HIGH (ref 70–99)
Glucose-Capillary: 147 mg/dL — ABNORMAL HIGH (ref 70–99)
Glucose-Capillary: 153 mg/dL — ABNORMAL HIGH (ref 70–99)
Glucose-Capillary: 175 mg/dL — ABNORMAL HIGH (ref 70–99)

## 2019-04-27 LAB — CBC
HCT: 25.5 % — ABNORMAL LOW (ref 39.0–52.0)
Hemoglobin: 7.3 g/dL — ABNORMAL LOW (ref 13.0–17.0)
MCH: 26.5 pg (ref 26.0–34.0)
MCHC: 28.6 g/dL — ABNORMAL LOW (ref 30.0–36.0)
MCV: 92.7 fL (ref 80.0–100.0)
Platelets: 639 10*3/uL — ABNORMAL HIGH (ref 150–400)
RBC: 2.75 MIL/uL — ABNORMAL LOW (ref 4.22–5.81)
RDW: 16.4 % — ABNORMAL HIGH (ref 11.5–15.5)
WBC: 27.1 10*3/uL — ABNORMAL HIGH (ref 4.0–10.5)
nRBC: 0.1 % (ref 0.0–0.2)

## 2019-04-27 LAB — PHOSPHORUS: Phosphorus: 3.5 mg/dL (ref 2.5–4.6)

## 2019-04-27 LAB — MAGNESIUM: Magnesium: 2.8 mg/dL — ABNORMAL HIGH (ref 1.7–2.4)

## 2019-04-27 MED ORDER — PRO-STAT SUGAR FREE PO LIQD
30.0000 mL | Freq: Four times a day (QID) | ORAL | Status: DC
  Administered 2019-04-29 – 2019-04-30 (×4): 30 mL
  Filled 2019-04-27 (×6): qty 30

## 2019-04-27 MED ORDER — DEXTROSE 5 % IV SOLN
INTRAVENOUS | Status: DC
  Administered 2019-04-27: 23:00:00 via INTRAVENOUS

## 2019-04-27 MED ORDER — POTASSIUM CHLORIDE 20 MEQ/15ML (10%) PO SOLN
40.0000 meq | Freq: Once | ORAL | Status: AC
  Administered 2019-04-27: 12:00:00 40 meq via ORAL
  Filled 2019-04-27: qty 30

## 2019-04-27 MED ORDER — BETHANECHOL CHLORIDE 10 MG PO TABS
10.0000 mg | ORAL_TABLET | Freq: Every day | ORAL | Status: DC
  Administered 2019-04-27 – 2019-04-30 (×3): 10 mg
  Filled 2019-04-27 (×4): qty 1

## 2019-04-27 NOTE — Progress Notes (Addendum)
NAME:  Angel Stuart, MRN:  [id number], DOB:  [birth year], LOS: 33 ADMISSION DATE:  03/16/2019, CONSULTATION DATE:  10/26 REFERRING MD:  Sharon Seller, CHIEF COMPLAINT:  Dyspnea   Brief History   58 yo male developed respiratory symptoms 10/05, and tested positive for COVID 19 on 10/09. Presented to [facility] ER 10/25 with progressive dyspnea. Found to have hypoxia and b/l pulmonary infiltrates, required intubation and transferred to [facility].  Has had a prolonged ICU stay, required tracheostomy on 11/17.  Course complicated by pseudomonas infection and encephalopathy.  Past Medical History  HTN, HLD, DM  Significant Hospital Events   10/26 admission to ICU, prone position 10/29 fever 11/02 worsening oxygenation, persistent fever >> change ABx 11/05 d/c ABx 11/06 atrial fib with RVR 11/07 recurrent fever, change ABx 11/12 start pressure support weaning 1114 Fever 103.1, adjust ABx for HCAP 11/17 Trach 11/19 Restarted on paralytics due to vent dyynchrony, 1 unit PRBC for low Hb 11/20 off paralytics 11/23 stopped dilaudid, started clonidine for precedex withdrawal, weaned on pressure support for 5-6 hours  Consults:   Procedures:  10/26 ETT >11/17 Janina Mayo Ninetta Lights) 11/17>>> 10/26 L subclavian CVL >11/4 L IJ 11/17>> out   Significant Diagnostic Tests:  Doppler legs b/l 11/01 >> no DVT Echo 11/05 >> EF 60 to 65%, grade 1 DD, mild LVH CT head/chest/ab/pelvis 11/25 >>>  CT chest, abdomen, pelvis 11/25>> new pneumomediastinum, no pneumothorax.  Improving diffuse bilateral GGO.  No focal sources of infection identified. CT head 11/25>> no significant sinusitis, diffuse mild cortical atrophy and chronic white matter changes  Micro Data:  Blood 10/29 >> negative Sputum 10/30 >> MSSA, Pseudomonas Sputum 11/07 >>Pseudomonas, pansensitive  Sputum 11/14 >>Pseudomonas, cephalosporin resistance  Blood 11/22 > NGTD Resp 11/22 > pseudomonas Urine 11/22 > NGTD c diff 11/24 >  negative  Antimicrobials/COVID Rx  Remdesivir 10/26 >> 10/30 Decadron 10/26 >> 11/04 Convalescent plasma 10/26  Zosyn 10/30 >>11/02 Vancomycin 10/30 >> 10/31 Fortaz 11/02 >> 11/05 Cefepime 11/07 >> off Vancomycin 11/14 >> off Cipro 11/16>> 11/23 Inhaled Tobi 11/23 >>   Interim history/subjective:  Remains off sedation but not following commands, still tachypneic.  No changes overnight.  Objective   Blood pressure (!) 104/48, pulse 99, temperature 100.2 F (37.9 C), temperature source Oral, resp. rate (!) 34, height 5\' 11"  (1.803 m), weight 96.9 kg, SpO2 93 %.    Vent Mode: PRVC FiO2 (%):  [30 %] 30 % Set Rate:  [20 bmp] 20 bmp Vt Set:  [600 mL] 600 mL PEEP:  [5 cmH20] 5 cmH20 Plateau Pressure:  [24 cmH20-29 cmH20] 29 cmH20   Intake/Output Summary (Last 24 hours) at 04/27/2019 1721 Last data filed at 04/27/2019 1600 Gross per 24 hour  Intake 5222.83 ml  Output 2600 ml  Net 2622.83 ml   Filed Weights   04/24/19 0402 04/25/19 0500 04/27/19 0500  Weight: 95.4 kg 95.6 kg 96.9 kg    Examination:  General: Critically ill-appearing man lying in bed in no acute distress, not responsive.  No sedation. HENT: [place]/AT, eyes anicteric Neck: Trach in place-no bleeding or erythema surrounding it PULM: Tachypnea unchanged from previous exams.  Mild rhonchi bilaterally. CV: Normally tachycardic, no murmurs GI: Obese, soft, nontender MSK: Severe atrophy, especially in his leg muscles. Neuro: Intact cough and corneal reflexes.  Not tracking today.  Not responsive to verbal or painful stimulation.  Not following commands.  No sedation on board Derm: Pallor, no bruising or bleeding.  No rashes.  Less skin tenting today.  Resolved Hospital Problem list     Assessment & Plan:  ARDS: oxygenation improved, fibroproliferative phase, slow progress, suspect this will take weeks to recover Trach, bleeding around trach > resolved -Continue LTV V, 4 to 8 cc/kg ideal body weight with goal  plateau less than 30 and driving pressure less than 15.  Titrate PEEP and FiO2 per ARDS ladder . -Continue weaning trials as tolerated.  Mental status precludes significant changes in current ventilator plan -Routine trach care -VAP prevention protocol -Limit sedation  Pseudomonas: recurrent respiratory trach infections vs colonization -TOBI nebs for 2 weeks  Elevated WBC with fevers, blood culture neg, c diff neg, u/a clean-question of hyper inflammatory syndrome related to Covid.  Of antibodies positive.  No new sources of infection identified on pan-CT  -Schedule Tylenol; avoiding NSAIDs due to AKI -Cooling blankets -Continue to monitor for new sources of infection  Atrial fibrillation: now rate- controlled -Amiodarone p.o.  AKI-suspect prerenal due to insensible losses -Volume resuscitation-2 L of LR today; monitor O2 sats -Free water flushes to address hypernatremia -Continue to monitor -Avoid nephrotoxic meds  Urinary retention, status post Foley catheter reinsertion -Continue high-dose Flomax -Adding Urecholine -Will try Foley catheter removal with voiding trials again tomorrow  Anemia without bleeding; drop in hemoglobin likely due to delusional effect Thrombocytosis: inflammatory reaction? -Continue to monitor -She is for hemoglobin less than 7  Hypernatremia Nonoliguric AKI with azotemia-significantly worsened over the last 2 days -continue free water flushes -Aggressive fever control -Goal is euvolemia; monitor I/O -Avoid nephrotoxic meds  DM2 -Continue Accu-Cheks every 4 hours with sliding scale insulin as needed -Goal BG 140-180 while admitted to the ICU  Encephalopathy: prolonged ICU delirium, medication not helping with sedation, possibly precedex related  -Minimizing sedation as much as possible -Continue supportive care and avoidance of delirium genic medications    Best practice:  Diet: tube feeding Pain/Anxiety/Delirium protocol (if indicated):  as above, RASS target 0 VAP protocol (if indicated): yes DVT prophylaxis: lovenox GI prophylaxis: famotidine Glucose control: as above Mobility: passive range of motion Code Status: DNR Family Communication: Will update wife Disposition: ICU  Labs   CBC: Recent Labs  Lab 04/23/19 0520 04/24/19 0225 04/25/19 0130 04/25/19 0319 04/26/19 0335 04/26/19 0520 04/27/19 0610  WBC 32.0* 28.8* 29.4*  --   --  22.8* 27.1*  NEUTROABS  --   --  22.1*  --   --   --   --   HGB 10.2* 9.8* 9.7* 10.2* 8.8* 8.4* 7.3*  HCT 33.5* 34.0* 34.5* 30.0* 26.0* 29.8* 25.5*  MCV 90.5 92.4 95.0  --   --  94.0 92.7  PLT 926* 1,052* 975*  --   --  861* 639*    Basic Metabolic Panel: Recent Labs  Lab 04/24/19 1605 04/24/19 1955 04/25/19 0130 04/25/19 0319 04/25/19 1700 04/26/19 0335 04/26/19 0520 04/26/19 1701 04/27/19 0610  NA 159* 159* 160* 159*  --  158* 158*  --  154*  K 3.5 4.4 4.2 4.0  --  3.6 3.7  --  3.3*  CL 109 111 111  --   --   --  110  --  110  CO2 38* 38* 37*  --   --   --  35*  --  34*  GLUCOSE 88 149* 288*  --   --   --  168*  --  106*  BUN 98* 96* 99*  --   --   --  122*  --  95*  CREATININE 1.02 1.08  1.21  --   --   --  1.32*  --  0.96  CALCIUM 9.0 8.9 8.7*  --   --   --  8.4*  --  8.2*  MG 2.3  --  2.4  --  2.5*  --  2.7* 2.6* 2.8*  PHOS 3.0  --  3.7  --  4.1  --  3.7 3.5 3.5   GFR: Estimated Creatinine Clearance: 98.3 mL/min (by C-G formula based on SCr of 0.96 mg/dL). Recent Labs  Lab 04/24/19 0225 04/25/19 0130 04/26/19 0520 04/27/19 0610  WBC 28.8* 29.4* 22.8* 27.1*    Liver Function Tests: No results for input(s): AST, ALT, ALKPHOS, BILITOT, PROT, ALBUMIN in the last 168 hours. No results for input(s): LIPASE, AMYLASE in the last 168 hours. No results for input(s): AMMONIA in the last 168 hours.  ABG    Component Value Date/Time   PHART 7.540 (H) 04/26/2019 0335   PCO2ART 48.8 (H) 04/26/2019 0335   PO2ART 94.0 04/26/2019 0335   HCO3 41.7 (H) 04/26/2019  0335   TCO2 43 (H) 04/26/2019 0335   ACIDBASEDEF 3.0 (H) 12/01/18 1442   O2SAT 98.0 04/26/2019 0335     Coagulation Profile: No results for input(s): INR, PROTIME in the last 168 hours.  Cardiac Enzymes: No results for input(s): CKTOTAL, CKMB, CKMBINDEX, TROPONINI in the last 168 hours.  HbA1C: Hgb A1c MFr Bld  Date/Time Value Ref Range Status  12/01/18 01:00 PM 7.5 (H) 4.8 - 5.6 % Final    Comment:    (NOTE) Pre diabetes:          5.7%-6.4% Diabetes:              >6.4% Glycemic control for   <7.0% adults with diabetes     CBG: Recent Labs  Lab 04/27/19 0005 04/27/19 0340 04/27/19 0858 04/27/19 1157 04/27/19 1548  GLUCAP 147* 115* 141* 175* 153*     This patient is critically ill with multiple organ system failure which requires frequent high complexity decision making, assessment, support, evaluation, and titration of therapies. This was completed through the application of advanced monitoring technologies and extensive interpretation of multiple databases. During this encounter critical care time was devoted to patient care services described in this note for 31 minutes.  Steffanie DunnLaura P Makylah Bossard, DO 04/27/19 5:21 PM [facility]     Wife Melissa updated.  Steffanie DunnLaura P Takita Riecke, DO 04/27/19 7:27 PM [facility]

## 2019-04-27 NOTE — Progress Notes (Signed)
[facility] Progress Note Patient Name: Angel Stuart DOB: 10-10-1960 MRN: [id number]   Date of Service  04/27/2019  HPI/Events of Note  cortrak is coiled up in mouth. He stopped tube feed for tonight. it will have to be replaced by team in AM? or MOnday (IDK if they come on weekends) Can they get an order to hold tube feeds? and hold insulin or give dextrose?  Hypernatremia on free water supplement. On Lantus BID and ssi.   eICU Interventions  - OK to hold TF. - Dextrose at 50 ml/hr. - hold Lantus.      Intervention Category Intermediate Interventions: Other:;Medication change / dose adjustment;Electrolyte abnormality - evaluation and management  Elmer Sow 04/27/2019, 10:17 PM

## 2019-04-27 NOTE — Progress Notes (Signed)
Video call with wife, [name] complete

## 2019-04-27 NOTE — Progress Notes (Signed)
Pt lavaged with 10 cc saline flush per MD order. Pt stable throughout with no complications. Copious amount of yellow/tan thick secretions suctioned out.

## 2019-04-28 DIAGNOSIS — Z7189 Other specified counseling: Secondary | ICD-10-CM

## 2019-04-28 LAB — GLUCOSE, CAPILLARY
Glucose-Capillary: 110 mg/dL — ABNORMAL HIGH (ref 70–99)
Glucose-Capillary: 123 mg/dL — ABNORMAL HIGH (ref 70–99)
Glucose-Capillary: 137 mg/dL — ABNORMAL HIGH (ref 70–99)
Glucose-Capillary: 137 mg/dL — ABNORMAL HIGH (ref 70–99)
Glucose-Capillary: 116 mg/dL — ABNORMAL HIGH (ref 70–99)
Glucose-Capillary: 33 mg/dL — CL (ref 70–99)
Glucose-Capillary: 102 mg/dL — ABNORMAL HIGH (ref 70–99)
Glucose-Capillary: 46 mg/dL — ABNORMAL LOW (ref 70–99)
Glucose-Capillary: 87 mg/dL (ref 70–99)
Glucose-Capillary: 89 mg/dL (ref 70–99)

## 2019-04-28 LAB — BASIC METABOLIC PANEL
Anion gap: 9 (ref 5–15)
BUN: 64 mg/dL — ABNORMAL HIGH (ref 6–20)
CO2: 33 mmol/L — ABNORMAL HIGH (ref 22–32)
Calcium: 8.2 mg/dL — ABNORMAL LOW (ref 8.9–10.3)
Chloride: 112 mmol/L — ABNORMAL HIGH (ref 98–111)
Creatinine, Ser: 0.87 mg/dL (ref 0.61–1.24)
GFR calc Af Amer: >60 mL/min (ref >60)
GFR calc non Af Amer: >60 mL/min (ref >60)
Glucose, Bld: 134 mg/dL — ABNORMAL HIGH (ref 70–99)
Potassium: 3.5 mmol/L (ref 3.5–5.1)
Sodium: 154 mmol/L — ABNORMAL HIGH (ref 135–145)

## 2019-04-28 LAB — CBC
HCT: 24.1 % — ABNORMAL LOW (ref 39.0–52.0)
Hemoglobin: 7.1 g/dL — ABNORMAL LOW (ref 13.0–17.0)
MCH: 27.2 pg (ref 26.0–34.0)
MCHC: 29.5 g/dL — ABNORMAL LOW (ref 30.0–36.0)
MCV: 92.3 fL (ref 80.0–100.0)
Platelets: 669 10*3/uL — ABNORMAL HIGH (ref 150–400)
RBC: 2.61 MIL/uL — ABNORMAL LOW (ref 4.22–5.81)
RDW: 16.3 % — ABNORMAL HIGH (ref 11.5–15.5)
WBC: 29.1 10*3/uL — ABNORMAL HIGH (ref 4.0–10.5)
nRBC: 0.1 % (ref 0.0–0.2)

## 2019-04-28 LAB — MAGNESIUM: Magnesium: 2.6 mg/dL — ABNORMAL HIGH (ref 1.7–2.4)

## 2019-04-28 LAB — PHOSPHORUS: Phosphorus: 4.3 mg/dL (ref 2.5–4.6)

## 2019-04-28 MED ORDER — FAMOTIDINE IN NACL 20-0.9 MG/50ML-% IV SOLN
20.0000 mg | Freq: Two times a day (BID) | INTRAVENOUS | Status: DC
  Administered 2019-04-28 – 2019-04-30 (×4): 20 mg via INTRAVENOUS
  Filled 2019-04-28 (×4): qty 50

## 2019-04-28 MED ORDER — DEXTROSE 50 % IV SOLN
INTRAVENOUS | Status: AC
  Administered 2019-04-28: 50 mL
  Filled 2019-04-28: qty 50

## 2019-04-28 MED ORDER — DEXTROSE 50 % IV SOLN
INTRAVENOUS | Status: AC
  Administered 2019-04-28: 25 mL
  Filled 2019-04-28: qty 50

## 2019-04-28 MED ORDER — AMIODARONE IV BOLUS ONLY 150 MG/100ML
150.0000 mg | Freq: Every day | INTRAVENOUS | Status: DC
  Administered 2019-04-29: 150 mg via INTRAVENOUS
  Filled 2019-04-28: qty 100

## 2019-04-28 MED ORDER — DEXTROSE 10 % IV SOLN
INTRAVENOUS | Status: DC
  Administered 2019-04-28: 18:00:00 via INTRAVENOUS

## 2019-04-28 MED ORDER — DEXTROSE 50 % IV SOLN: 12.5000 g | INTRAVENOUS | Status: AC

## 2019-04-28 MED ORDER — LACTATED RINGERS IV SOLN
INTRAVENOUS | Status: DC
  Administered 2019-04-28: 10 mL/h via INTRAVENOUS
  Administered 2019-04-29 – 2019-04-30 (×2): via INTRAVENOUS

## 2019-04-28 NOTE — Progress Notes (Signed)
   04/28/19 2000  Vitals  Temp (!) 103 F (39.4 C)  Npo, cannot give oral tylenol. Ice packs placed on pt & blankets removed.

## 2019-04-28 NOTE — Progress Notes (Signed)
Upon doing oral care, RN noticed that cortrak was coiled in patient's  Mouth. Tube feeding was held and md notified. Order received to start dextrose 5% at 21ml/h.

## 2019-04-28 NOTE — Progress Notes (Signed)
NAME:  Angel Stuart, MRN:  [id number], DOB:  [birth year], LOS: 34 ADMISSION DATE:  03/11/2019, CONSULTATION DATE:  10/26 REFERRING MD:  Sharon Seller, CHIEF COMPLAINT:  Dyspnea   Brief History   58 yo male developed respiratory symptoms 10/05, and tested positive for COVID 19 on 10/09. Presented to [facility] ER 10/25 with progressive dyspnea. Found to have hypoxia and b/l pulmonary infiltrates, required intubation and transferred to [facility].  Has had a prolonged ICU stay, required tracheostomy on 11/17.  Course complicated by pseudomonas infection and encephalopathy.  Past Medical History  HTN, HLD, DM  Significant Hospital Events   10/26 admission to ICU, prone position 10/29 fever 11/02 worsening oxygenation, persistent fever >> change ABx 11/05 d/c ABx 11/06 atrial fib with RVR 11/07 recurrent fever, change ABx 11/12 start pressure support weaning 1114 Fever 103.1, adjust ABx for HCAP 11/17 Trach 11/19 Restarted on paralytics due to vent dyynchrony, 1 unit PRBC for low Hb 11/20 off paralytics 11/23 stopped dilaudid, started clonidine for precedex withdrawal, weaned on pressure support for 5-6 hours  Consults:   Procedures:  10/26 ETT >11/17 Janina Mayo Ninetta Lights) 11/17>>> 10/26 L subclavian CVL >11/4 L IJ 11/17>> out   Significant Diagnostic Tests:  Doppler legs b/l 11/01 >> no DVT Echo 11/05 >> EF 60 to 65%, grade 1 DD, mild LVH CT head/chest/ab/pelvis 11/25 >>>  CT chest, abdomen, pelvis 11/25>> new pneumomediastinum, no pneumothorax.  Improving diffuse bilateral GGO.  No focal sources of infection identified. CT head 11/25>> no significant sinusitis, diffuse mild cortical atrophy and chronic white matter changes  Micro Data:  Blood 10/29 >> negative Sputum 10/30 >> MSSA, Pseudomonas Sputum 11/07 >>Pseudomonas, pansensitive  Sputum 11/14 >>Pseudomonas, cephalosporin resistance  Blood 11/22 > NGTD Resp 11/22 > pseudomonas Urine 11/22 > NGTD c diff 11/24 >  negative  Antimicrobials/COVID Rx  Remdesivir 10/26 >> 10/30 Decadron 10/26 >> 11/04 Convalescent plasma 10/26  Zosyn 10/30 >>11/02 Vancomycin 10/30 >> 10/31 Fortaz 11/02 >> 11/05 Cefepime 11/07 >> off Vancomycin 11/14 >> off Cipro 11/16>> 11/23 Inhaled Tobi 11/23 >>   Interim history/subjective:  Remains off sedation. Small- bore feeding tube coiled in his mouth causing gagging- removed today and unable to be replaced.  Objective   Blood pressure 109/71, pulse (!) 112, temperature 100.1 F (37.8 C), temperature source Axillary, resp. rate 11, height 5\' 11"  (1.803 m), weight 97.1 kg, SpO2 94 %.    Vent Mode: PRVC FiO2 (%):  [30 %] 30 % Set Rate:  [20 bmp] 20 bmp Vt Set:  [600 mL] 600 mL PEEP:  [5 cmH20] 5 cmH20 Plateau Pressure:  [24 cmH20-28 cmH20] 25 cmH20   Intake/Output Summary (Last 24 hours) at 04/28/2019 2015 Last data filed at 04/28/2019 1800 Gross per 24 hour  Intake 965.06 ml  Output 1475 ml  Net -509.94 ml   Filed Weights   04/25/19 0500 04/27/19 0500 04/28/19 0530  Weight: 95.6 kg 96.9 kg 97.1 kg    Examination:  General: critically ill appearing gentleman laying in bed in NAD HENT: [place]/AT, eyes anicteric Neck: trach without bleeding or erythema PULM: tachypnea unchanged from previous exams, no accessory muscle use. Thinner secretions from trach. Rhonchi cleared with suctioning. CV: Normally tachycardic, no murmurs GI: obese, soft, NT, ND MSK: Severe muscular atrophy especially in his legs. Neuro: Intact cough and corneal reflexes.  Pupils symmetric.  Not withdrawing from pain or responding to verbal stimulation Derm: Pallor without bruising or rashes   Resolved Hospital Problem list     Assessment &  Plan:  ARDS: oxygenation improved, fibroproliferative phase, slow progress, suspect this will take weeks to recover Trach, bleeding around trach > resolved -Continue low tidal volume ventilation, 4 to 8 cc/kg ideal body weight with goal plateau less  than 30 and driving pressure less than 15. -Continue weaning trials as tolerated -Continue routine trach care -VAP prevention protocol -Limit sedation due to encephalopathy  Pseudomonas: recurrent respiratory trach infections vs colonization -Tobi nebs for 2 weeks  Elevated WBC with fevers, blood culture neg, c diff neg, u/a clean-question of hyper inflammatory syndrome related to Covid.  Of antibodies positive.  No new sources of infection identified on pan-CT  -Continue scheduled Tylenol; avoid NSAIDs due to recent AKI -Cooling blankets -Continue to monitor for new sources of infection  Atrial fibrillation: now rate- controlled -Amiodarone p.o. on hold while feeding tube is removed.  Can convert to IV if required.  AKI-suspect prerenal due to insensible losses -Continue volume resuscitation with supplemental dextrose -Free water flushes on hold without enteral access -Continue to monitor -Avoid nephrotoxic meds -Measure I/O  Urinary retention, status post Foley catheter reinsertion -Continue high-dose Flomax -Continue Urecholine -Foley catheter removal trial tomorrow  Anemia without bleeding; drop in hemoglobin likely due to delusional effect Thrombocytosis: inflammatory reaction? -Transfuse for hemoglobin less than 7  Hypernatremia Nonoliguric AKI with azotemia-significantly worsened over the last 2 days -continue D10 infusion -Continue to monitor -Aggressive fever control -Goal is euvolemia; monitor I/O  DM2 -Holding evening insulin due to hypoglycemia -Continue D10 infusion -Goal BG 140-180 while admitted to the ICU   Encephalopathy: prolonged ICU delirium, medication not helping with sedation, possibly precedex related  -Holding all sedation -Continue supportive care and avoiding of sedating or deliriogenic medications   I had a discussion with Mr. Googe wife [name] today.  She is very concerned that his condition has plateaued and is most concerned  about his mental status.  She understands that we are working on reversing metabolic causes of encephalopathy.  She feels as though her husband would not want prolonged life support.  She indicated that she is considering withdrawing care within the next week if his condition has not significantly improved.     Best practice:  Diet: tube feeding Pain/Anxiety/Delirium protocol (if indicated): as above, RASS target 0 VAP protocol (if indicated): yes DVT prophylaxis: lovenox GI prophylaxis: famotidine Glucose control: as above Mobility: passive range of motion Code Status: DNR Family Communication: Updated wife Melissa this evening Disposition: ICU  Labs   CBC: Recent Labs  Lab 04/24/19 0225 04/25/19 0130 04/25/19 0319 04/26/19 0335 04/26/19 0520 04/27/19 0610 04/28/19 0605  WBC 28.8* 29.4*  --   --  22.8* 27.1* 29.1*  NEUTROABS  --  22.1*  --   --   --   --   --   HGB 9.8* 9.7* 10.2* 8.8* 8.4* 7.3* 7.1*  HCT 34.0* 34.5* 30.0* 26.0* 29.8* 25.5* 24.1*  MCV 92.4 95.0  --   --  94.0 92.7 92.3  PLT 1,052* 975*  --   --  861* 639* 669*    Basic Metabolic Panel: Recent Labs  Lab 04/24/19 1955 04/25/19 0130 04/25/19 0319 04/25/19 1700 04/26/19 0335 04/26/19 0520 04/26/19 1701 04/27/19 0610 04/28/19 0605  NA 159* 160* 159*  --  158* 158*  --  154* 154*  K 4.4 4.2 4.0  --  3.6 3.7  --  3.3* 3.5  CL 111 111  --   --   --  110  --  110 112*  CO2 38* 37*  --   --   --  35*  --  34* 33*  GLUCOSE 149* 288*  --   --   --  168*  --  106* 134*  BUN 96* 99*  --   --   --  122*  --  95* 64*  CREATININE 1.08 1.21  --   --   --  1.32*  --  0.96 0.87  CALCIUM 8.9 8.7*  --   --   --  8.4*  --  8.2* 8.2*  MG  --  2.4  --  2.5*  --  2.7* 2.6* 2.8* 2.6*  PHOS  --  3.7  --  4.1  --  3.7 3.5 3.5 4.3   GFR: Estimated Creatinine Clearance: 108.6 mL/min (by C-G formula based on SCr of 0.87 mg/dL). Recent Labs  Lab 04/25/19 0130 04/26/19 0520 04/27/19 0610 04/28/19 0605  WBC 29.4*  22.8* 27.1* 29.1*    Liver Function Tests: No results for input(s): AST, ALT, ALKPHOS, BILITOT, PROT, ALBUMIN in the last 168 hours. No results for input(s): LIPASE, AMYLASE in the last 168 hours. No results for input(s): AMMONIA in the last 168 hours.  ABG    Component Value Date/Time   PHART 7.540 (H) 04/26/2019 0335   PCO2ART 48.8 (H) 04/26/2019 0335   PO2ART 94.0 04/26/2019 0335   HCO3 41.7 (H) 04/26/2019 0335   TCO2 43 (H) 04/26/2019 0335   ACIDBASEDEF 3.0 (H) 03/20/2019 1442   O2SAT 98.0 04/26/2019 0335     Coagulation Profile: No results for input(s): INR, PROTIME in the last 168 hours.  Cardiac Enzymes: No results for input(s): CKTOTAL, CKMB, CKMBINDEX, TROPONINI in the last 168 hours.  HbA1C: Hgb A1c MFr Bld  Date/Time Value Ref Range Status  03/02/2019 01:00 PM 7.5 (H) 4.8 - 5.6 % Final    Comment:    (NOTE) Pre diabetes:          5.7%-6.4% Diabetes:              >6.4% Glycemic control for   <7.0% adults with diabetes     CBG: Recent Labs  Lab 04/28/19 1609 04/28/19 1726 04/28/19 1815 04/28/19 1900 04/28/19 1951  GLUCAP 116* 46* 102* 87 89     This patient is critically ill with multiple organ system failure which requires frequent high complexity decision making, assessment, support, evaluation, and titration of therapies. This was completed through the application of advanced monitoring technologies and extensive interpretation of multiple databases. During this encounter critical care time was devoted to patient care services described in this note for 40 minutes.  Steffanie DunnLaura P Barth Trella, DO 04/28/19 8:15 PM [facility]

## 2019-04-28 NOTE — Progress Notes (Signed)
Hypoglycemic Event  CBG: 69  Treatment: 83ml d50  Symptoms: none  Follow-up CBG: Time:0045 CBG Result:110  Possible Reasons for Event: Tube feeding on hold  Comments/MD notified yes    Amo Kuffour, Trilby Drummer

## 2019-04-29 ENCOUNTER — Inpatient Hospital Stay (HOSPITAL_COMMUNITY): Payer: PRIVATE HEALTH INSURANCE

## 2019-04-29 LAB — GLUCOSE, CAPILLARY
Glucose-Capillary: 123 mg/dL — ABNORMAL HIGH (ref 70–99)
Glucose-Capillary: 203 mg/dL — ABNORMAL HIGH (ref 70–99)
Glucose-Capillary: 238 mg/dL — ABNORMAL HIGH (ref 70–99)
Glucose-Capillary: 245 mg/dL — ABNORMAL HIGH (ref 70–99)
Glucose-Capillary: 282 mg/dL — ABNORMAL HIGH (ref 70–99)
Glucose-Capillary: 314 mg/dL — ABNORMAL HIGH (ref 70–99)
Glucose-Capillary: 69 mg/dL — ABNORMAL LOW (ref 70–99)

## 2019-04-29 LAB — CBC
HCT: 26.7 % — ABNORMAL LOW (ref 39.0–52.0)
Hemoglobin: 7.7 g/dL — ABNORMAL LOW (ref 13.0–17.0)
MCH: 26.5 pg (ref 26.0–34.0)
MCHC: 28.8 g/dL — ABNORMAL LOW (ref 30.0–36.0)
MCV: 91.8 fL (ref 80.0–100.0)
Platelets: 623 10*3/uL — ABNORMAL HIGH (ref 150–400)
RBC: 2.91 MIL/uL — ABNORMAL LOW (ref 4.22–5.81)
RDW: 16.7 % — ABNORMAL HIGH (ref 11.5–15.5)
WBC: 35.3 10*3/uL — ABNORMAL HIGH (ref 4.0–10.5)
nRBC: 0.1 % (ref 0.0–0.2)

## 2019-04-29 LAB — PHOSPHORUS: Phosphorus: 4.9 mg/dL — ABNORMAL HIGH (ref 2.5–4.6)

## 2019-04-29 LAB — BASIC METABOLIC PANEL
Anion gap: 12 (ref 5–15)
BUN: 61 mg/dL — ABNORMAL HIGH (ref 6–20)
CO2: 31 mmol/L (ref 22–32)
Calcium: 8.5 mg/dL — ABNORMAL LOW (ref 8.9–10.3)
Chloride: 113 mmol/L — ABNORMAL HIGH (ref 98–111)
Creatinine, Ser: 1.16 mg/dL (ref 0.61–1.24)
GFR calc Af Amer: >60 mL/min (ref >60)
GFR calc non Af Amer: >60 mL/min (ref >60)
Glucose, Bld: 202 mg/dL — ABNORMAL HIGH (ref 70–99)
Potassium: 3.8 mmol/L (ref 3.5–5.1)
Sodium: 156 mmol/L — ABNORMAL HIGH (ref 135–145)

## 2019-04-29 LAB — MAGNESIUM: Magnesium: 2.6 mg/dL — ABNORMAL HIGH (ref 1.7–2.4)

## 2019-04-29 MED ORDER — DEXTROSE 5 % IV SOLN
INTRAVENOUS | Status: DC
  Administered 2019-04-29 – 2019-04-30 (×4): via INTRAVENOUS

## 2019-04-29 NOTE — Consult Note (Signed)
WOC Nurse Consult Note: Reason for Consult: sacrum Wound type:  Deep tissue pressure injury Pressure Injury POA:No Measurement: 5cm x 7cm x 0.1cm  Wound bed:100% dark non blanchable purple tissue Drainage (amount, consistency, odor) scant; no odor Periwound: intact  Dressing procedure/placement/frequency: Silicone foam; monitor for evolution.  Contact WOC nurse for acute changes. If and when eschar forms new topical care will be needed.  Patient with persistent fevers; not able to control with meds/NSAIDs. Staff feel low air loss mattress makes patient even warmer; for now using turning and repositioning only.  Dietician following for supplementation for wound healing.   [place] Nurse team will follow with you and see patient within 10 days for wound assessments.  Please notify [place] nurses of any acute changes in the wounds or any new areas of concern [place] MSN, RN,CWOCN, [place], CWON-AP [phone] :

## 2019-04-29 NOTE — Progress Notes (Signed)
Spoke with and updated patients wife. Was able to answer any questions she had.

## 2019-04-29 NOTE — Progress Notes (Signed)
  Spoke with and updated patients wife. Will set up facetime for this afternoon.

## 2019-04-29 NOTE — Progress Notes (Signed)
NAME:  Angel Stuart, MRN:  [id number], DOB:  1960/06/29, LOS: 35 ADMISSION DATE:  Mar 26, 2019, CONSULTATION DATE:  10/26 REFERRING MD:  Sharon Seller, CHIEF COMPLAINT:  Dyspnea   Brief History   58 yo male developed respiratory symptoms 10/05, and tested positive for COVID 19 on 10/09. Presented to [facility] ER 10/25 with progressive dyspnea. Found to have hypoxia and b/l pulmonary infiltrates, required intubation and transferred to [facility]. Has had a prolonged ICU stay, required tracheostomy on 11/17. Course complicated by pseudomonas infection and encephalopathy.  Past Medical History  HTN, HLD, DM  Significant Hospital Events   10/26 admission to ICU, prone position 10/29 fever 11/02 worsening oxygenation, persistent fever >> change ABx 11/05 d/c ABx 11/06 atrial fib with RVR 11/07 recurrent fever, change ABx 11/12 start pressure support weaning 1114 Fever 103.1, adjust ABx for HCAP 11/17 Trach 11/19 Restarted on paralytics due to vent dyynchrony, 1 unit PRBC for low Hb 11/20 off paralytics 11/23 stopped dilaudid, started clonidine for precedex withdrawal, weaned on pressure support for 5-6 hours  Consults:    Procedures:  10/26 ETT >11/17 Janina Mayo Ninetta Lights) 11/17>>> 10/26 L subclavian CVL >11/4 L IJ 11/17>>out  Significant Diagnostic Tests:  Doppler legs b/l 11/01 >> no DVT Echo 11/05 >> EF 60 to 65%, grade 1 DD, mild LVH CT head/chest/ab/pelvis 11/25 >>>  CT chest, abdomen, pelvis 11/25>> new pneumomediastinum, no pneumothorax.  Improving diffuse bilateral GGO.  No focal sources of infection identified. CT head 11/25>> no significant sinusitis, diffuse mild cortical atrophy and chronic white matter changes  Micro Data:  Blood 10/29 >> negative Sputum 10/30 >> MSSA, Pseudomonas Sputum 11/07 >>Pseudomonas, pansensitive  Sputum 11/14 >>Pseudomonas, cephalosporin resistance  Blood 11/22 > NGTD Resp 11/22 > pseudomonas Urine 11/22 > NGTD c diff 11/24 > negative   Antimicrobials:  Remdesivir 10/26 >> 10/30 Decadron 10/26 >> 11/04 Convalescent plasma 10/26  Zosyn 10/30 >>11/02 Vancomycin 10/30 >> 10/31 Fortaz 11/02 >> 11/05 Cefepime 11/07 >> off Vancomycin 11/14 >> off Cipro 11/16>> 11/23 Inhaled Tobi 11/23 >>   Interim history/subjective:  Remains off sedation Feeding tube out   Objective   Blood pressure 102/63, pulse (!) 118, temperature 97.7 F (36.5 C), temperature source Axillary, resp. rate (!) 35, height 5\' 11"  (1.803 m), weight 101.6 kg, SpO2 97 %.    Vent Mode: PRVC FiO2 (%):  [30 %] 30 % Set Rate:  [20 bmp] 20 bmp Vt Set:  [600 mL] 600 mL PEEP:  [5 cmH20] 5 cmH20 Plateau Pressure:  [25 cmH20-28 cmH20] 25 cmH20   Intake/Output Summary (Last 24 hours) at 04/29/2019 1004 Last data filed at 04/29/2019 0600 Gross per 24 hour  Intake 1036.83 ml  Output 1250 ml  Net -213.17 ml   Filed Weights   04/27/19 0500 04/28/19 0530 04/29/19 0500  Weight: 96.9 kg 97.1 kg 101.6 kg    Examination:  General:  In bed on vent HENT: NCAT tracheostomy in place PULM: CTA B, vent supported breathing CV: RRR, no mgr GI: BS+, soft, nontender MSK: normal bulk and tone Neuro: winces to pain, opens eyes to voice, tracks with eyes  Resolved Hospital Problem list     Assessment & Plan:  ARDS due to COVID 19 pneumonia, will take weeks to recover Continue low tidal ventilation, Plateau < 30, Driving pressure < 15 cm 05/01/19 Continue pressure support daily as able Continue routine tracheostomy care VAP prevention Hold sedation  Pseudomonas colonization Tobi neb 2 weeks  Atrial fib Tele Holding amiodarone for now until NG  replaced  AKI/hypernatremia Increase total free water via IV with D5W today Replace NG, restart enteral free water  Urinary retention Continue urecholine, flomax Trial removal foley  DM2 SSI, continue to monitor blood sugar 140-180  Encephalopathy: prolonged ICU delirium, severe neuromuscular weakness;  appears to be tracking on exam 11/29 and 11/30 Holding all sedation Consider neurology consult, will discuss with wife today   Best practice:  Diet: tube feeding, replace NG today Pain/Anxiety/Delirium protocol (if indicated): RASS goal 0 VAP protocol (if indicated): yes DVT prophylaxis: lovenox bid per protocol GI prophylaxis: famotidine bid Glucose control: glargine/SSI Mobility: bed rest, passive range of motion exercises Code Status: DNR if arrests Family Communication: I updated Melissa at length.  Plan to re-assess his overall condition on Wednesday this week. Disposition: remain in ICU  Labs   CBC: Recent Labs  Lab 04/25/19 0130  04/26/19 0335 04/26/19 0520 04/27/19 0610 04/28/19 0605 04/29/19 0555  WBC 29.4*  --   --  22.8* 27.1* 29.1* 35.3*  NEUTROABS 22.1*  --   --   --   --   --   --   HGB 9.7*   < > 8.8* 8.4* 7.3* 7.1* 7.7*  HCT 34.5*   < > 26.0* 29.8* 25.5* 24.1* 26.7*  MCV 95.0  --   --  94.0 92.7 92.3 91.8  PLT 975*  --   --  861* 639* 669* 623*   < > = values in this interval not displayed.    Basic Metabolic Panel: Recent Labs  Lab 04/25/19 0130  04/26/19 0335 04/26/19 0520 04/26/19 1701 04/27/19 0610 04/28/19 0605 04/29/19 0555  NA 160*   < > 158* 158*  --  154* 154* 156*  K 4.2   < > 3.6 3.7  --  3.3* 3.5 3.8  CL 111  --   --  110  --  110 112* 113*  CO2 37*  --   --  35*  --  34* 33* 31  GLUCOSE 288*  --   --  168*  --  106* 134* 202*  BUN 99*  --   --  122*  --  95* 64* 61*  CREATININE 1.21  --   --  1.32*  --  0.96 0.87 1.16  CALCIUM 8.7*  --   --  8.4*  --  8.2* 8.2* 8.5*  MG 2.4   < >  --  2.7* 2.6* 2.8* 2.6* 2.6*  PHOS 3.7   < >  --  3.7 3.5 3.5 4.3 4.9*   < > = values in this interval not displayed.   GFR: Estimated Creatinine Clearance: 83.2 mL/min (by C-G formula based on SCr of 1.16 mg/dL). Recent Labs  Lab 04/26/19 0520 04/27/19 0610 04/28/19 0605 04/29/19 0555  WBC 22.8* 27.1* 29.1* 35.3*    Liver Function Tests: No  results for input(s): AST, ALT, ALKPHOS, BILITOT, PROT, ALBUMIN in the last 168 hours. No results for input(s): LIPASE, AMYLASE in the last 168 hours. No results for input(s): AMMONIA in the last 168 hours.  ABG    Component Value Date/Time   PHART 7.540 (H) 04/26/2019 0335   PCO2ART 48.8 (H) 04/26/2019 0335   PO2ART 94.0 04/26/2019 0335   HCO3 41.7 (H) 04/26/2019 0335   TCO2 43 (H) 04/26/2019 0335   ACIDBASEDEF 3.0 (H) 03/20/2019 1442   O2SAT 98.0 04/26/2019 0335     Coagulation Profile: No results for input(s): INR, PROTIME in the last 168 hours.  Cardiac Enzymes: No results  for input(s): CKTOTAL, CKMB, CKMBINDEX, TROPONINI in the last 168 hours.  HbA1C: Hgb A1c MFr Bld  Date/Time Value Ref Range Status  Mar 29, 2019 01:00 PM 7.5 (H) 4.8 - 5.6 % Final    Comment:    (NOTE) Pre diabetes:          5.7%-6.4% Diabetes:              >6.4% Glycemic control for   <7.0% adults with diabetes     CBG: Recent Labs  Lab 04/28/19 1815 04/28/19 1900 04/28/19 1951 04/29/19 0030 04/29/19 0814  GLUCAP 102* 87 89 123* 203*     Critical care time: 35 minutes      Roselie Awkward, MD [place] [facility] Pager: [phone] Cell: [phone] If no response, call [phone]

## 2019-04-29 NOTE — Progress Notes (Signed)
Nutrition Follow-up RD working remotely.  DOCUMENTATION CODES:   Obesity unspecified  INTERVENTION:   Resume TF when feeding tube can be replaced. Cortrak service next available Wednesday, 12/2.   Vital 1.5 at 65 ml/h (1560 ml per day)   Pro-stat 30 ml QID   Provides 2740 kcal, 165 gm protein, 1192 ml free water daily   Juven BID via tube, each packet provides 80 calories, 8 grams of carbohydrate, 2.5  grams of protein (collagen), 7 grams of L-arginine and 7 grams of L-glutamine; supplement contains CaHMB, Vitamins C, E, B12 and Zinc to promote wound healing.  NUTRITION DIAGNOSIS:   Increased nutrient needs related to acute illness as evidenced by estimated needs.  Ongoing   GOAL:   Provide needs based on ASPEN/SCCM guidelines   Unmet with feeding tube out and TF off  MONITOR:   Vent status, Labs, I & O's, TF tolerance  ASSESSMENT:   58 yo male admitted with progressive SOB after testing positive for COVID-19 on 10/9 (symptoms started 10/5). PMH includes DM, HLD.   10/28 Postpyloric Cortrak tube placed  11/17 S/P tracheostomy 11/29 Cortrak tube found coiled in patient's mouth, TF off  Patient remains on ventilator support via trach. MV: 22.8 L/min Temp (24hrs), Avg:100.9 F (38.3 C), Min:97.7 F (36.5 C), Max:103.2 F (39.6 C)   Labs reviewed. Sodium 156 (H), phosphorus 4.9 (H), magnesium 2.6 (H) CBG's: 123-203  Medications reviewed and include lasix, novolog, lantus, Juven,  Miralax, Flomax, Florastor, zinc sulfate.  I/O +12.3 L since admission Admit weight 111.4 kg Current weight 101.6 kg Patient with mild non pitting edema to BUE, BLE and mild +1 pitting generalized edema per RN documentation today.  Diet Order:   Diet Order            Diet NPO time specified  Diet effective now              EDUCATION NEEDS:   Not appropriate for education at this time  Skin:  Skin Assessment: Skin Integrity Issues: Skin Integrity Issues:: DTI DTI:  sacrum Stage II: L ear Unstageable: N/A  Last BM:  11/30 type 7  Height:   Ht Readings from Last 1 Encounters:  04/16/19 5\' 11"  (1.803 m)    Weight:   Wt Readings from Last 1 Encounters:  04/29/19 101.6 kg   Admit weight 111.4 kg  Ideal Body Weight:  78.2 kg  BMI:  Body mass index is 31.24 kg/m.  Estimated Nutritional Needs:   Kcal:  2700-2800  Protein:  >/= 156 gm  Fluid:  >/= 1.8 L    Molli Barrows, RD, LDN, [place] Pager [phone] After Hours Pager [phone]

## 2019-04-29 NOTE — Progress Notes (Signed)
Inpatient Diabetes Program Recommendations  AACE/ADA: New Consensus Statement on Inpatient Glycemic Control (2015)  Target Ranges:  Prepandial:   less than 140 mg/dL      Peak postprandial:   less than 180 mg/dL (1-2 hours)      Critically ill patients:  140 - 180 mg/dL   Lab Results  Component Value Date   GLUCAP 238 (H) 04/29/2019   HGBA1C 7.5 (H) 2019/04/08    Review of Glycemic Control Results for Angel Stuart, Angel Stuart (MRN [id number]) as of 04/29/2019 17:18  Ref. Range 04/28/2019 19:51 04/29/2019 00:30 04/29/2019 08:14 04/29/2019 14:06 04/29/2019 17:01  Glucose-Capillary Latest Ref Range: 70 - 99 mg/dL 89 123 (H) 203 (H) 282 (H) 238 (H)   Diabetes history: DM 2 Outpatient Diabetes medications:  Lantus 60 units daily, Tradjenta 5 mg daily, Metformin 1000 mg bid Current orders for Inpatient glycemic control:  Lantus 50 units bid, Novolog resistant q 4 hours  Inpatient Diabetes Program Recommendations:    Note patient had hypoglycemia on 11/28.  Last dose of Lantus insulin was on 11/28 in the AM.  Please reduce Lantus to 20 units daily.   Thanks  Adah Perl, RN, BC-ADM Inpatient Diabetes Coordinator Pager [phone] (8a-5p)

## 2019-04-30 ENCOUNTER — Inpatient Hospital Stay (HOSPITAL_COMMUNITY): Payer: PRIVATE HEALTH INSURANCE

## 2019-04-30 LAB — CBC
HCT: 22.6 % — ABNORMAL LOW (ref 39.0–52.0)
Hemoglobin: 6.6 g/dL — CL (ref 13.0–17.0)
MCH: 26.6 pg (ref 26.0–34.0)
MCHC: 29.2 g/dL — ABNORMAL LOW (ref 30.0–36.0)
MCV: 91.1 fL (ref 80.0–100.0)
Platelets: 392 10*3/uL (ref 150–400)
RBC: 2.48 MIL/uL — ABNORMAL LOW (ref 4.22–5.81)
RDW: 16.5 % — ABNORMAL HIGH (ref 11.5–15.5)
WBC: 32.5 10*3/uL — ABNORMAL HIGH (ref 4.0–10.5)
nRBC: 0.1 % (ref 0.0–0.2)

## 2019-04-30 LAB — CBC WITH DIFFERENTIAL/PLATELET
Abs Immature Granulocytes: 0.28 10*3/uL — ABNORMAL HIGH (ref 0.00–0.07)
Basophils Absolute: 0.2 10*3/uL — ABNORMAL HIGH (ref 0.0–0.1)
Basophils Relative: 1 %
Eosinophils Absolute: 0.3 10*3/uL (ref 0.0–0.5)
Eosinophils Relative: 1 %
HCT: 20.9 % — ABNORMAL LOW (ref 39.0–52.0)
Hemoglobin: 6.1 g/dL — CL (ref 13.0–17.0)
Immature Granulocytes: 1 %
Lymphocytes Relative: 8 %
Lymphs Abs: 2.4 10*3/uL (ref 0.7–4.0)
MCH: 26.8 pg (ref 26.0–34.0)
MCHC: 29.2 g/dL — ABNORMAL LOW (ref 30.0–36.0)
MCV: 91.7 fL (ref 80.0–100.0)
Monocytes Absolute: 1.1 10*3/uL — ABNORMAL HIGH (ref 0.1–1.0)
Monocytes Relative: 4 %
Neutro Abs: 26.3 10*3/uL — ABNORMAL HIGH (ref 1.7–7.7)
Neutrophils Relative %: 85 %
Platelets: 397 10*3/uL (ref 150–400)
RBC: 2.28 MIL/uL — ABNORMAL LOW (ref 4.22–5.81)
RDW: 16.4 % — ABNORMAL HIGH (ref 11.5–15.5)
WBC: 30.5 10*3/uL — ABNORMAL HIGH (ref 4.0–10.5)
nRBC: 0 % (ref 0.0–0.2)

## 2019-04-30 LAB — BASIC METABOLIC PANEL
Anion gap: 11 (ref 5–15)
BUN: 70 mg/dL — ABNORMAL HIGH (ref 6–20)
CO2: 29 mmol/L (ref 22–32)
Calcium: 7.9 mg/dL — ABNORMAL LOW (ref 8.9–10.3)
Chloride: 107 mmol/L (ref 98–111)
Creatinine, Ser: 1.06 mg/dL (ref 0.61–1.24)
GFR calc Af Amer: >60 mL/min (ref >60)
GFR calc non Af Amer: >60 mL/min (ref >60)
Glucose, Bld: 309 mg/dL — ABNORMAL HIGH (ref 70–99)
Potassium: 3.2 mmol/L — ABNORMAL LOW (ref 3.5–5.1)
Sodium: 147 mmol/L — ABNORMAL HIGH (ref 135–145)

## 2019-04-30 LAB — PHOSPHORUS: Phosphorus: 4.1 mg/dL (ref 2.5–4.6)

## 2019-04-30 LAB — D-DIMER, QUANTITATIVE: D-Dimer, Quant: 1.24 ug/mL-FEU — ABNORMAL HIGH (ref 0.00–0.50)

## 2019-04-30 LAB — GLUCOSE, CAPILLARY
Glucose-Capillary: 290 mg/dL — ABNORMAL HIGH (ref 70–99)
Glucose-Capillary: 280 mg/dL — ABNORMAL HIGH (ref 70–99)
Glucose-Capillary: 285 mg/dL — ABNORMAL HIGH (ref 70–99)
Glucose-Capillary: 315 mg/dL — ABNORMAL HIGH (ref 70–99)

## 2019-04-30 LAB — PREPARE RBC (CROSSMATCH)

## 2019-04-30 LAB — MAGNESIUM: Magnesium: 2.3 mg/dL (ref 1.7–2.4)

## 2019-04-30 MED ORDER — ACETAMINOPHEN 650 MG RE SUPP: 650.0000 mg | Freq: Four times a day (QID) | RECTAL | Status: DC | PRN

## 2019-04-30 MED ORDER — PANTOPRAZOLE SODIUM 40 MG IV SOLR
40.0000 mg | Freq: Every day | INTRAVENOUS | Status: DC
  Administered 2019-04-30: 40 mg via INTRAVENOUS
  Filled 2019-04-30: qty 40

## 2019-04-30 MED ORDER — MORPHINE SULFATE (PF) 2 MG/ML IV SOLN: 2.0000 mg | INTRAVENOUS | Status: DC | PRN

## 2019-04-30 MED ORDER — AMIODARONE IV BOLUS ONLY 150 MG/100ML
150.0000 mg | Freq: Every day | INTRAVENOUS | Status: DC
  Administered 2019-04-30: 150 mg via INTRAVENOUS
  Filled 2019-04-30: qty 100

## 2019-04-30 MED ORDER — POLYVINYL ALCOHOL 1.4 % OP SOLN
1.0000 [drp] | Freq: Four times a day (QID) | OPHTHALMIC | Status: DC | PRN
  Filled 2019-04-30: qty 15

## 2019-04-30 MED ORDER — GLYCOPYRROLATE 1 MG PO TABS
1.0000 mg | ORAL_TABLET | ORAL | Status: DC | PRN
  Filled 2019-04-30: qty 1

## 2019-04-30 MED ORDER — MORPHINE 100MG IN NS 100ML (1MG/ML) PREMIX INFUSION
0.0000 mg/h | INTRAVENOUS | Status: DC
  Administered 2019-04-30: 10 mg/h via INTRAVENOUS
  Filled 2019-04-30 (×2): qty 100

## 2019-04-30 MED ORDER — GLYCOPYRROLATE 0.2 MG/ML IJ SOLN: 0.2000 mg | INTRAMUSCULAR | Status: DC | PRN

## 2019-04-30 MED ORDER — INSULIN ASPART 100 UNIT/ML ~~LOC~~ SOLN
10.0000 [IU] | SUBCUTANEOUS | Status: DC
  Administered 2019-04-30: 10 [IU] via SUBCUTANEOUS

## 2019-04-30 MED ORDER — MORPHINE BOLUS VIA INFUSION
5.0000 mg | INTRAVENOUS | Status: DC | PRN
  Filled 2019-04-30: qty 5

## 2019-04-30 MED ORDER — FAMOTIDINE 40 MG/5ML PO SUSR: 20.0000 mg | Freq: Two times a day (BID) | ORAL | Status: DC

## 2019-04-30 MED ORDER — DEXTROSE 5 % IV SOLN: INTRAVENOUS | Status: DC

## 2019-04-30 MED ORDER — DIPHENHYDRAMINE HCL 50 MG/ML IJ SOLN: 25.0000 mg | INTRAMUSCULAR | Status: DC | PRN

## 2019-04-30 MED ORDER — ACETAMINOPHEN 325 MG PO TABS: 650.0000 mg | ORAL_TABLET | Freq: Four times a day (QID) | ORAL | Status: DC | PRN

## 2019-04-30 MED ORDER — SODIUM CHLORIDE 0.9% IV SOLUTION: Freq: Once | INTRAVENOUS | Status: DC

## 2019-04-30 MED ORDER — AMIODARONE HCL 200 MG PO TABS: 200.0000 mg | ORAL_TABLET | Freq: Every day | ORAL | Status: DC

## 2019-05-04 LAB — TYPE AND SCREEN
ABO/RH(D): A POS
Antibody Screen: NEGATIVE
Unit division: 0

## 2019-05-04 LAB — BPAM RBC
Blood Product Expiration Date: 202012222359
Unit Type and Rh: 6200

## 2019-05-31 NOTE — Progress Notes (Signed)
Pt taken off ventilator per Withdrawal of life protocol. Lurline Idol remains in position.

## 2019-05-31 NOTE — Progress Notes (Signed)
[facility]  Notified of Hgb 6.1 gm/dL Stat repeat CBC ordered  Roselie Awkward, MD [place] Pager: [phone] Cell: [phone] If no response, call [phone]

## 2019-05-31 NOTE — Progress Notes (Signed)
Time of death confirmed with 2 nurses, asystole noted on the monitor and no heart sounds auscultated after 1 minute. Time of death 14-Sep-2103.

## 2019-05-31 NOTE — Progress Notes (Signed)
Morphine gtt wasted with Mithcell, RN 10ml wasted in stericycle.

## 2019-05-31 NOTE — Progress Notes (Signed)
Lavaged pt for 10 cc saline flush per MD order. Small amount of yellow/tan thick secretions suctioned out with no complications.

## 2019-05-31 NOTE — Progress Notes (Signed)
Transported pt to family visit for end of life visitation on ventilator. Pt stable throughout with no complications.

## 2019-05-31 NOTE — Death Summary Note (Addendum)
27-May-2019 Addendum Agree, Pressure injury Left ear stage 2 not POA & Deep tissue injury mid sacrum, not POA   Coletta Memos 27-May-2019 8:23 PM   DEATH SUMMARY   Patient Details  Name: Angel Stuart MRN: [id number] DOB: August 20, 1960  Admission/Discharge Information   Admit Date:  16-Apr-2019  Date of Death: Date of Death: 2019/05/22  Time of Death: Time of Death: 2103/10/22  Length of Stay: 2034-10-22  Referring Physician: Randel Pigg, Dorma Russell, MD   Reason(s) for Hospitalization  Acute respiratory failure  Diagnoses  Preliminary cause of death:   Secondary Diagnoses (including complications and co-morbidities):  Active Problems:   Pneumonia due to COVID-19 virus   Acute respiratory failure (HCC)   Encounter for nasogastric (NG) tube placement   Acute pulmonary edema (HCC)   Acute encephalopathy   Acute respiratory distress syndrome (ARDS) due to COVID-19 virus (HCC)   AKI (acute kidney injury) (HCC)   Pressure injury of skin   Tracheostomy status [facility])   Brief Hospital Course (including significant findings, care, treatment, and services provided and events leading to death)  Angel Stuart is a 59 y.o. year old male who developed respiratory symptoms 10/05, and tested positive for COVID 19 on 10/09.  Presented to [facility] ER 10/25 with progressive dyspnea.  Found to have hypoxia and b/l pulmonary infiltrates, required intubation and transferred to [facility].  Has had a prolonged ICU stay, required tracheostomy on 11/17.  Course complicated by pseudomonas infection and encephalopathy.  16-Apr-2023 admission to ICU, prone position 10/29 fever 11/02 worsening oxygenation, persistent fever >> change ABx 11/05 d/c ABx 11/06 atrial fib with RVR 11/07 recurrent fever, change ABx 11/12 start pressure support weaning 1114 Fever 103.1, adjust ABx for HCAP 11/17 Trach 11/19 Restarted on paralytics due to vent dyynchrony, 1 unit PRBC for low Hb 11/20 off paralytics 11/23 stopped dilaudid,  started clonidine for precedex withdrawal, weaned on pressure support for 5-6 hours 05/22/23 Pt taken off ventilator per Withdrawal of life protocol  Micro Data:  Blood 10/29 >> negative Sputum 10/30 >> MSSA, Pseudomonas Sputum 11/07 >> Pseudomonas, pansensitive  Sputum 11/14 >> Pseudomonas, cephalosporin resistance   Blood 11/22 > NGTD Resp 11/22 > pseudomonas Urine 11/22 > NGTD c diff 11/24 > negative   Antimicrobials:  Remdesivir Apr 16, 2023 >> 10/30 Decadron 2023/04/16 >> 11/04 Convalescent plasma Apr 16, 2023   Zosyn 10/30 >>11/02 Vancomycin 10/30 >> 10/31 Fortaz 11/02 >> 11/05 Cefepime 11/07 >> off Vancomycin 11/14 >> off Cipro 11/16>> 11/23 Inhaled Tobi 11/23 >>   Pertinent Labs and Studies  Significant Diagnostic Studies Dg Abd 1 View  Result Date: 04/29/2019 CLINICAL DATA:  Feeding tube placement EXAM: ABDOMEN - 1 VIEW COMPARISON:  None. FINDINGS: Tube tip enters the stomach, just past the gastroesophageal junction. Gas pattern otherwise unremarkable. Previous cholecystectomy. IMPRESSION: Tube tip just past the gastroesophageal junction in the mid stomach. Electronically Signed   By: Paulina Fusi M.D.   On: 04/29/2019 12:29   Dg Abd 1 View  Result Date: 04/29/2019 CLINICAL DATA:  Enteric tube placement EXAM: ABDOMEN - 1 VIEW COMPARISON:  04/24/2019 CT abdomen/pelvis FINDINGS: Weighted enteric tube tip overlies the lower thoracic esophagus. No dilated small bowel loops in the visualized upper abdomen. No evidence of pneumatosis or pneumoperitoneum. Cholecystectomy clips are seen in the right upper quadrant of the abdomen. Hazy right lung base opacity. IMPRESSION: Weighted enteric tube tip overlies the lower thoracic esophagus, suggest advancing 8-10 cm. These results will be called to the ordering clinician or representative by the Radiologist Assistant,  and communication documented in the PACS or zVision Dashboard. Electronically Signed   By: Delbert PhenixJason A Poff M.D.   On: 04/29/2019 10:53   Ct  Head Wo Contrast  Result Date: 04/24/2019 CLINICAL DATA:  Encephalopathy. EXAM: CT HEAD WITHOUT CONTRAST TECHNIQUE: Contiguous axial images were obtained from the base of the skull through the vertex without intravenous contrast. COMPARISON:  None. FINDINGS: Brain: Mild diffuse cortical atrophy is noted. Mild chronic ischemic white matter disease is noted. No mass effect or midline shift is noted. Ventricular size is within normal limits. There is no evidence of mass lesion, hemorrhage or acute infarction. Vascular: No hyperdense vessel or unexpected calcification. Skull: Normal. Negative for fracture or focal lesion. Sinuses/Orbits: Mild right maxillary sinusitis is noted. Other: Fluid is noted in the mastoid air cells bilaterally. IMPRESSION: Mild diffuse cortical atrophy. Mild chronic ischemic white matter disease. No acute intracranial abnormality seen. Electronically Signed   By: Lupita RaiderJames  Green Jr M.D.   On: 04/24/2019 14:48   Ct Chest W Contrast  Result Date: 04/24/2019 CLINICAL DATA:  Sepsis. COVID-19 infection. History of diabetes. EXAM: CT CHEST, ABDOMEN, AND PELVIS WITH CONTRAST TECHNIQUE: Multidetector CT imaging of the chest, abdomen and pelvis was performed following the standard protocol during bolus administration of intravenous contrast. CONTRAST:  100mL OMNIPAQUE IOHEXOL 300 MG/ML  SOLN COMPARISON:  Radiographs 04/23/2019 and 04/19/2019. Chest CT 03/24/2019. Abdominopelvic CT 03/29/2016. FINDINGS: CT CHEST FINDINGS Cardiovascular: Mild atherosclerosis of the aorta, great vessels and coronary arteries. No acute vascular findings are identified. There is no evidence of acute pulmonary embolism on nondedicated imaging. The heart size is normal. There is no pericardial effusion. Mediastinum/Nodes: There are no enlarged mediastinal, hilar or axillary lymph nodes. Small mediastinal lymph nodes are stable. Interval development of pneumomediastinum without associated mediastinal fluid collection or  significant inflammation. Tracheostomy and feeding tube are in place. Lungs/Pleura: There is no pleural effusion or pneumothorax. There has been partial clearing of the previously demonstrated diffuse bilateral ground-glass and airspace opacities in both lungs. Some confluent components remain dependently in the right lower lobe. Musculoskeletal/Chest wall: Stable superior endplate compression deformity at T6. No acute osseous findings. CT ABDOMEN AND PELVIS FINDINGS Hepatobiliary: The liver is normal in density without suspicious focal abnormality. No significant biliary dilatation status post cholecystectomy. Pancreas: Unremarkable. No pancreatic ductal dilatation or surrounding inflammatory changes. Spleen: Normal in size without focal abnormality. Adrenals/Urinary Tract: Both adrenal glands appear normal. The kidneys and ureters appear stable without acute findings. There is mild symmetric perinephric soft tissue stranding bilaterally. No hydronephrosis or urinary tract calculus. The bladder is decompressed by a Foley catheter. A small amount of air is present in the bladder lumen. Stomach/Bowel: No evidence of bowel wall thickening, distention or surrounding inflammatory change. Feeding tube extends into the 4th portion of the duodenum. Rectal tube is in place. Vascular/Lymphatic: There are no enlarged abdominal or pelvic lymph nodes. Aortic and branch vessel atherosclerosis. No acute vascular findings. Reproductive: The prostate gland and seminal vesicles appear normal. Other: Postsurgical changes in the anterior abdominal wall. No ascites, focal fluid collection or extraluminal air collections. Musculoskeletal: No acute or significant osseous findings. Stable mild lumbar spondylosis. IMPRESSION: 1. Interval development of pneumomediastinum without associated mediastinal fluid collection or pneumothorax. 2. Interval partial clearing of previously demonstrated diffuse ground-glass and airspace opacities in both  lungs. Some confluent components remain dependently in the right lower lobe. No significant pleural effusion. 3. No acute findings demonstrated within the abdomen or pelvis. 4. Satisfactory position of the support system. 5.  Aortic Atherosclerosis (ICD10-I70.0). Electronically Signed   By: Carey Bullocks M.D.   On: 04/24/2019 15:19   Ct Abdomen Pelvis W Contrast  Result Date: 04/24/2019 CLINICAL DATA:  Sepsis. COVID-19 infection. History of diabetes. EXAM: CT CHEST, ABDOMEN, AND PELVIS WITH CONTRAST TECHNIQUE: Multidetector CT imaging of the chest, abdomen and pelvis was performed following the standard protocol during bolus administration of intravenous contrast. CONTRAST:  OMNIPAQUE IOHEXOL 300 MG/ML  SOLN COMPARISON:  Radiographs 04/23/2019 and 04/19/2019. Chest CT 03/24/2019. Abdominopelvic CT 03/29/2016. FINDINGS: CT CHEST FINDINGS Cardiovascular: Mild atherosclerosis of the aorta, great vessels and coronary arteries. No acute vascular findings are identified. There is no evidence of acute pulmonary embolism on nondedicated imaging. The heart size is normal. There is no pericardial effusion. Mediastinum/Nodes: There are no enlarged mediastinal, hilar or axillary lymph nodes. Small mediastinal lymph nodes are stable. Interval development of pneumomediastinum without associated mediastinal fluid collection or significant inflammation. Tracheostomy and feeding tube are in place. Lungs/Pleura: There is no pleural effusion or pneumothorax. There has been partial clearing of the previously demonstrated diffuse bilateral ground-glass and airspace opacities in both lungs. Some confluent components remain dependently in the right lower lobe. Musculoskeletal/Chest wall: Stable superior endplate compression deformity at T6. No acute osseous findings. CT ABDOMEN AND PELVIS FINDINGS Hepatobiliary: The liver is normal in density without suspicious focal abnormality. No significant biliary dilatation status post  cholecystectomy. Pancreas: Unremarkable. No pancreatic ductal dilatation or surrounding inflammatory changes. Spleen: Normal in size without focal abnormality. Adrenals/Urinary Tract: Both adrenal glands appear normal. The kidneys and ureters appear stable without acute findings. There is mild symmetric perinephric soft tissue stranding bilaterally. No hydronephrosis or urinary tract calculus. The bladder is decompressed by a Foley catheter. A small amount of air is present in the bladder lumen. Stomach/Bowel: No evidence of bowel wall thickening, distention or surrounding inflammatory change. Feeding tube extends into the 4th portion of the duodenum. Rectal tube is in place. Vascular/Lymphatic: There are no enlarged abdominal or pelvic lymph nodes. Aortic and branch vessel atherosclerosis. No acute vascular findings. Reproductive: The prostate gland and seminal vesicles appear normal. Other: Postsurgical changes in the anterior abdominal wall. No ascites, focal fluid collection or extraluminal air collections. Musculoskeletal: No acute or significant osseous findings. Stable mild lumbar spondylosis. IMPRESSION: 1. Interval development of pneumomediastinum without associated mediastinal fluid collection or pneumothorax. 2. Interval partial clearing of previously demonstrated diffuse ground-glass and airspace opacities in both lungs. Some confluent components remain dependently in the right lower lobe. No significant pleural effusion. 3. No acute findings demonstrated within the abdomen or pelvis. 4. Satisfactory position of the support system. 5. Aortic Atherosclerosis (ICD10-I70.0). Electronically Signed   By: Carey Bullocks M.D.   On: 04/24/2019 15:19   Dg Chest Port 1 View  Result Date: 05/26/2019 CLINICAL DATA:  Acute respiratory failure EXAM: PORTABLE CHEST 1 VIEW COMPARISON:  04/25/2019 FINDINGS: Cardiac shadow is stable. Tracheostomy tube and nasogastric catheter are noted. The previously seen feeding  catheter has been removed. The proximal side port on the gastric catheter lies at the GE junction and could be advanced a few cm as necessary. Lungs are well aerated bilaterally. Persistent airspace disease is again identified and stable. No bony abnormality is noted. IMPRESSION: Feeding catheter has been exchanged for a nasogastric catheter with the proximal side port at the GE junction. This could be advanced a few cm as necessary. Persistent bilateral airspace opacities stable from the prior exam. Electronically Signed   By: Eulah Pont.D.  On: 17-May-2019 08:16   Dg Chest Port 1 View  Result Date: 04/25/2019 CLINICAL DATA:  Acute respiratory failure with hypoxemia. COVID-19 positive EXAM: PORTABLE CHEST 1 VIEW COMPARISON:  04/23/2019 FINDINGS: Tracheostomy in good position. Feeding tube enters the stomach with the tip not visualized. Diffuse bilateral airspace disease unchanged. No superimposed acute infiltrate or effusion. No pneumothorax IMPRESSION: 1. No change in diffuse bilateral airspace disease. No superimposed acute abnormality. 2. Tracheostomy in good position. Electronically Signed   By: Marlan Palau M.D.   On: 04/25/2019 08:48   Dg Chest Port 1 View  Result Date: 04/23/2019 CLINICAL DATA:  Acute respiratory failure with hypoxemia. EXAM: PORTABLE CHEST 1 VIEW COMPARISON:  April 19, 2019. FINDINGS: Stable cardiomediastinal silhouette. Tracheostomy tube and feeding tube are unchanged in position. No pneumothorax or pleural effusion is noted. Stable bilateral lung opacities are noted. Bony thorax is unremarkable. IMPRESSION: Stable support apparatus. Stable bilateral lung opacities are noted concerning for edema or pneumonia. Electronically Signed   By: Lupita Raider M.D.   On: 04/23/2019 16:14   Dg Chest Port 1 View  Result Date: 04/19/2019 CLINICAL DATA:  Acute respiratory failure EXAM: PORTABLE CHEST 1 VIEW COMPARISON:  Two days ago FINDINGS: Well seated tracheostomy tube. Left  IJ line with tip at the upper cavoatrial junction. Feeding tube that at least reaches the stomach. Coarse bilateral interstitial opacity with ground-glass density. Lung volumes are low. No effusion or pneumothorax. Stable heart size. IMPRESSION: Stable hardware positioning and bilateral airspace disease. Electronically Signed   By: Marnee Spring M.D.   On: 04/19/2019 08:03   Dg Chest Port 1 View  Result Date: 04/17/2019 CLINICAL DATA:  Check tracheostomy tube placement, history of COVID-19 positivity EXAM: PORTABLE CHEST 1 VIEW COMPARISON:  04/16/2019 FINDINGS: Tracheostomy tube and feeding catheter are again noted in satisfactory position. Left jugular central line is noted extending to the cavoatrial junction. Diffuse bilateral airspace disease is again identified stable from the prior exam. No bony abnormality is seen. IMPRESSION: Stable appearance of the chest when compared with the previous day. Electronically Signed   By: Alcide Clever M.D.   On: 04/17/2019 08:54   Dg Chest Port 1 View  Result Date: 04/16/2019 CLINICAL DATA:  Central line placement.  COVID-19 pneumonia. EXAM: PORTABLE CHEST 1 VIEW COMPARISON:  Earlier today at 1056 hours. FINDINGS: 0328 hours. Tracheostomy, new. Feeding tube extends beyond the inferior aspect of the film. Left internal jugular line tip at high right atrium. Mild cardiomegaly. Possible trace right pleural fluid or thickening. No pneumothorax. Diffuse airspace disease with minimal sparing of the right apex, similar. Numerous leads and wires project over the chest. IMPRESSION: Interval removal of right-sided line with new left internal jugular line, tip right atrium. Interval tracheostomy. Similar diffuse airspace disease. Electronically Signed   By: Jeronimo Greaves M.D.   On: 04/16/2019 15:54   Dg Chest Port 1 View  Addendum Date: 04/16/2019   ADDENDUM REPORT: 04/16/2019 12:28 ADDENDUM: Right-sided central venous catheter is heading into the internal jugular vein.  Electronically Signed   By: Jonna Clark M.D.   On: 04/16/2019 12:28   Result Date: 04/16/2019 CLINICAL DATA:  Central line placement EXAM: PORTABLE CHEST 1 VIEW COMPARISON:  None. FINDINGS: The heart size and mediastinal contours are within normal limits. Both lungs are clear. The there is been interval placement of a right-sided PICC with the tip heading into the internal jugular vein. Endotracheal tube tip and Dobbhoff tube are in unchanged position. Again noted is flex  the/patchy airspace opacities throughout both lungs. The cardiomediastinal silhouette is obscured. IMPRESSION: 1. Right-sided PICC with the tip ending in the internal jugular vein 2. These results will be called to the ordering clinician or representative by the Radiologist Assistant, and communication documented in the PACS or zVision Dashboard. 3. Unchanged ETT and Dobbhoff tube 4. No significant interval change in bilateral airspace fall Electronically Signed: By: Prudencio Pair M.D. On: 04/16/2019 11:21   Dg Chest Port 1 View  Result Date: 04/15/2019 CLINICAL DATA:  Acute respiratory failure EXAM: PORTABLE CHEST 1 VIEW COMPARISON:  04/15/2019, 04/13/2019, 04/12/2019, 04/05/2019 FINDINGS: Endotracheal tube tip is about 4.8 cm superior to the carina. Esophageal tube tip is below the diaphragm but incompletely visualized. Overall no significant interval change in extensive bilateral airspace disease. Obscured cardiomediastinal silhouette. No pneumothorax. IMPRESSION: 1. Support lines and tubes as above. 2. No significant interval change in extensive bilateral airspace disease. Electronically Signed   By: Donavan Foil M.D.   On: 04/15/2019 23:17   Dg Chest Port 1 View  Result Date: 04/15/2019 CLINICAL DATA:  COVID-19 positive. EXAM: PORTABLE CHEST 1 VIEW COMPARISON:  April 13, 2019. FINDINGS: Stable cardiomegaly. Endotracheal and feeding tubes are unchanged in position. No pneumothorax is noted. Mildly increased bilateral diffuse  lung opacities are noted most consistent with worsening pneumonia or edema. Significant pleural effusion is not identified. Bony thorax is unremarkable. IMPRESSION: Stable support apparatus. Mildly increased bilateral diffuse lung opacities are noted concerning for worsening pneumonia or edema. Electronically Signed   By: Marijo Conception M.D.   On: 04/15/2019 07:26   Dg Chest Port 1 View  Result Date: 04/13/2019 CLINICAL DATA:  Respiratory failure EXAM: PORTABLE CHEST 1 VIEW COMPARISON:  04/12/2019 FINDINGS: Interval increase in dense heterogeneous opacity of the left upper lobe with relatively unchanged diffuse interstitial and heterogeneous opacity elsewhere. Probable small layering pleural effusions. Cardiomegaly. Endotracheal tube and partially imaged enteric feeding tube in unchanged position. IMPRESSION: 1. Interval increase in dense heterogeneous opacity of the left upper lobe with relatively unchanged diffuse interstitial and heterogeneous opacity elsewhere. Findings are consistent with worsened infection, edema, and/or ARDS. 2.  Probable small layering pleural effusions. 3.  Cardiomegaly. 4. Endotracheal tube and partially imaged enteric feeding tube in unchanged position. Electronically Signed   By: Eddie Candle M.D.   On: 04/13/2019 11:19   Dg Chest Port 1 View  Result Date: 04/12/2019 CLINICAL DATA:  Respiratory failure. EXAM: PORTABLE CHEST 1 VIEW COMPARISON:  04/05/2019 CT 03/24/2019. FINDINGS: Endotracheal tube and feeding tube in stable position. Stable cardiomegaly. Diffuse bilateral pulmonary infiltrates/edema again noted. No interim change. No pleural effusion or pneumothorax. IMPRESSION: 1.  Endotracheal tube and feeding tube in stable position. 2. Stable cardiomegaly. Diffuse bilateral pulmonary infiltrates/edema without interim change. Electronically Signed   By: Marcello Moores  Register   On: 04/12/2019 08:04   Dg Chest Port 1 View  Result Date: 04/05/2019 CLINICAL DATA:  Acute  respiratory failure with hypoxia EXAM: PORTABLE CHEST 1 VIEW COMPARISON:  Yesterday FINDINGS: Endotracheal tube tip is just below the clavicular heads. The feeding tube at least reaches the stomach. Generalized worsening pulmonary opacity. Cardiomegaly. Lung volumes are low. No pneumothorax. IMPRESSION: 1. Stable hardware positioning. 2. Generalized worsening opacity, question edema superimposed on the multifocal pneumonia given the symmetry and rapidity of change. Electronically Signed   By: Monte Fantasia M.D.   On: 04/05/2019 09:38   Dg Chest Port 1 View  Result Date: 04/04/2019 CLINICAL DATA:  59 year old male with respiratory failure EXAM: PORTABLE  CHEST 1 VIEW COMPARISON:  04/01/2011 FINDINGS: Cardiomediastinal silhouette unchanged in size and contour. Endotracheal tube terminates above the carina approximately 5.5 cm. Enteric tube projects over the mediastinum and terminates out of the field of view. Interval removal of left subclavian central venous catheter. Improving aeration of the left lung. Persistent bilateral reticulonodular opacities. No pneumothorax or pleural effusion. IMPRESSION: Improved aeration of the left lung with persisting pattern of bilateral reticulonodular opacities. Unchanged endotracheal tube and enteric tube. Interval removal of left subclavian central catheter Electronically Signed   By: Gilmer Mor D.O.   On: 04/04/2019 09:33   Dg Chest Port 1 View  Result Date: 04/01/2019 CLINICAL DATA:  COVID positive patient.  Endotracheal tube in place. EXAM: PORTABLE CHEST 1 VIEW COMPARISON:  03/31/2019; 03/30/2019; 03/29/2019 FINDINGS: Grossly unchanged cardiac silhouette and mediastinal contours given patient rotation. Stable positioning of support apparatus. Worsening bilateral interstitial airspace opacities, worse within the left mid and lower lung. No definite pleural effusion or pneumothorax. No definite evidence of edema. No acute osseous abnormalities. IMPRESSION: 1.  Worsening bilateral interstitial airspace opacities, left greater than right, potentially atelectasis though worrisome for progression of atypical infection. 2. Stable positioning of support apparatus.  No pneumothorax. Electronically Signed   By: Simonne Come M.D.   On: 04/01/2019 07:49    Microbiology Recent Results (from the past 240 hour(s))  Culture, respiratory (non-expectorated)     Status: None   Collection Time: 04/21/19  8:34 AM   Specimen: Tracheal Aspirate; Respiratory  Result Value Ref Range Status   Specimen Description   Final    TRACHEAL ASPIRATE Performed at [facility], [address], [place], [place] [address]    Special Requests   Final    Normal Performed at [facility], [address], [place], [place] [address]    Gram Stain   Final    RARE WBC PRESENT, PREDOMINANTLY PMN RARE GRAM POSITIVE COCCI IN PAIRS RARE GRAM NEGATIVE RODS Performed at [facility], [address], [place], [place] [address]    Culture FEW PSEUDOMONAS AERUGINOSA  Final   Report Status 04/24/2019 FINAL  Final   Organism ID, Bacteria PSEUDOMONAS AERUGINOSA  Final      Susceptibility   Pseudomonas aeruginosa - MIC*    CEFTAZIDIME >=64 RESISTANT Resistant     CIPROFLOXACIN 1 SENSITIVE Sensitive     GENTAMICIN 2 SENSITIVE Sensitive     IMIPENEM 2 SENSITIVE Sensitive     CEFEPIME >=64 RESISTANT Resistant     * FEW PSEUDOMONAS AERUGINOSA  Culture, blood (routine x 2)     Status: None   Collection Time: 04/21/19  9:00 AM   Specimen: BLOOD  Result Value Ref Range Status   Specimen Description   Final    BLOOD LEFT ANTECUBITAL Performed at [facility], [address], [place], [place] [address]    Special Requests   Final    BOTTLES DRAWN AEROBIC ONLY Blood Culture adequate volume Performed at [facility], [address] [place], [place] [address]    Culture   Final    NO GROWTH 5 DAYS Performed at  [facility], [address], [place], [place] [address]    Report Status 04/26/2019 FINAL  Final  Culture, blood (routine x 2)     Status: None   Collection Time: 04/21/19  9:05 AM   Specimen: BLOOD  Result Value Ref Range Status   Specimen Description   Final  BLOOD RIGHT HAND Performed at [facility], [address], [place], [place] [address]    Special Requests   Final    BOTTLES DRAWN AEROBIC ONLY Blood Culture adequate volume Performed at [facility] [facility], [address] [place], [place] [address]    Culture   Final    NO GROWTH 5 DAYS Performed at [facility], [address], [place], [place] [address]    Report Status 04/26/2019 FINAL  Final  Culture, Urine     Status: None   Collection Time: 04/21/19  2:32 PM   Specimen: Urine, Random  Result Value Ref Range Status   Specimen Description   Final    URINE, RANDOM Performed at [facility], [address] [place], [place] [address]    Special Requests   Final    NONE Performed at [facility], [address] [place], [place] [address]    Culture   Final    NO GROWTH Performed at [facility], [address], [place], [place] [address]    Report Status 04/22/2019 FINAL  Final  MRSA PCR Screening     Status: None   Collection Time: 04/22/19 10:42 PM   Specimen: Nasopharyngeal  Result Value Ref Range Status   MRSA by PCR NEGATIVE NEGATIVE Final    Comment:        The GeneXpert MRSA Assay (FDA approved for NASAL specimens only), is one component of a comprehensive MRSA colonization surveillance program. It is not intended to diagnose MRSA infection nor to guide or monitor treatment for MRSA infections. Performed at [facility], [address] [place], [place] [address]   C difficile quick scan w PCR reflex     Status: None   Collection Time: 04/23/19  3:51 PM   Specimen: Urine, Catheterized;  Stool  Result Value Ref Range Status   C Diff antigen NEGATIVE NEGATIVE Final   C Diff toxin NEGATIVE NEGATIVE Final   C Diff interpretation No C. difficile detected.  Final    Comment: Performed at [facility], [address] [place], [place] [address]    Lab Basic Metabolic Panel: Recent Labs  Lab 04/26/19 0520 04/26/19 1701 04/27/19 0610 04/28/19 0605 04/29/19 0555 05/25/2019 0525  NA 158*  --  154* 154* 156* 147*  K 3.7  --  3.3* 3.5 3.8 3.2*  CL 110  --  110 112* 113* 107  CO2 35*  --  34* 33* 31 29  GLUCOSE 168*  --  106* 134* 202* 309*  BUN 122*  --  95* 64* 61* 70*  CREATININE 1.32*  --  0.96 0.87 1.16 1.06  CALCIUM 8.4*  --  8.2* 8.2* 8.5* 7.9*  MG 2.7* 2.6* 2.8* 2.6* 2.6* 2.3  PHOS 3.7 3.5 3.5 4.3 4.9* 4.1   Liver Function Tests: No results for input(s): AST, ALT, ALKPHOS, BILITOT, PROT, ALBUMIN in the last 168 hours. No results for input(s): LIPASE, AMYLASE in the last 168 hours. No results for input(s): AMMONIA in the last 168 hours. CBC: Recent Labs  Lab 04/25/19 0130  04/27/19 0610 04/28/19 0605 04/29/19 0555 2019-05-25 0525 05/25/2019 0824  WBC 29.4*   < > 27.1* 29.1* 35.3* 30.5* 32.5*  NEUTROABS 22.1*  --   --   --   --  26.3*  --   HGB 9.7*   < > 7.3* 7.1* 7.7* 6.1* 6.6*  HCT 34.5*   < >  25.5* 24.1* 26.7* 20.9* 22.6*  MCV 95.0   < > 92.7 92.3 91.8 91.7 91.1  PLT 975*   < > 639* 669* 623* 397 392   < > = values in this interval not displayed.   Cardiac Enzymes: No results for input(s): CKTOTAL, CKMB, CKMBINDEX, TROPONINI in the last 168 hours. Sepsis Labs: Recent Labs  Lab 04/28/19 0605 04/29/19 0555 05/24/2019 0525 05/04/2019 0824  WBC 29.1* 35.3* 30.5* 32.5*    Procedures/Operations  10/26 ETT > 11/17 Trach (JY) 11/17>>> 10/26 L subclavian CVL > 11/4 L IJ 11/17>> out   [employer] 05/09/2019, 9:27 PM

## 2019-05-31 NOTE — Progress Notes (Signed)
NAME:  Angel Stuart, MRN:  [id number], DOB:  02-19-1961, LOS: 46 ADMISSION DATE:  2019/04/04, CONSULTATION DATE:  10/26 REFERRING MD:  Thereasa Solo, CHIEF COMPLAINT:  Dyspnea   Brief History   59 yo male developed respiratory symptoms 10/05, and tested positive for COVID 19 on 10/09. Presented to [facility] ER 10/25 with progressive dyspnea. Found to have hypoxia and b/l pulmonary infiltrates, required intubation and transferred to [facility]. Has had a prolonged ICU stay, required tracheostomy on 11/17. Course complicated by pseudomonas infection and encephalopathy.  Past Medical History  HTN, HLD, DM  Significant Hospital Events   10/26 admission to ICU, prone position 10/29 fever 11/02 worsening oxygenation, persistent fever >> change ABx 11/05 d/c ABx 11/06 atrial fib with RVR 11/07 recurrent fever, change ABx 11/12 start pressure support weaning 1114 Fever 103.1, adjust ABx for HCAP 11/17 Trach 11/19 Restarted on paralytics due to vent dyynchrony, 1 unit PRBC for low Hb 11/20 off paralytics 11/23 stopped dilaudid, started clonidine for precedex withdrawal, weaned on pressure support for 5-6 hours  Consults:    Procedures:  10/26 ETT >11/17 Angel Stuart) 11/17>>> 10/26 L subclavian CVL >11/4 L IJ 11/17>>out  Significant Diagnostic Tests:  Doppler legs b/l 11/01 >> no DVT Echo 11/05 >> EF 60 to 65%, grade 1 DD, mild LVH CT head/chest/ab/pelvis 11/25 >>>  CT chest, abdomen, pelvis 11/25>> new pneumomediastinum, no pneumothorax.  Improving diffuse bilateral GGO.  No focal sources of infection identified. CT head 11/25>> no significant sinusitis, diffuse mild cortical atrophy and chronic white matter changes  Micro Data:  Blood 10/29 >> negative Sputum 10/30 >> MSSA, Pseudomonas Sputum 11/07 >>Pseudomonas, pansensitive  Sputum 11/14 >>Pseudomonas, cephalosporin resistance  Blood 11/22 > NGTD Resp 11/22 > pseudomonas Urine 11/22 > NGTD c diff 11/24 > negative   Antimicrobials:  Remdesivir 10/26 >> 10/30 Decadron 10/26 >> 11/04 Convalescent plasma 10/26  Zosyn 10/30 >>11/02 Vancomycin 10/30 >> 10/31 Fortaz 11/02 >> 11/05 Cefepime 11/07 >> off Vancomycin 11/14 >> off Cipro 11/16>> 11/23 Inhaled Tobi 11/23 >>   Interim history/subjective:   Hgb down  Holding lovenox, asa Hypotensive today No obvious bleeding  Objective   Blood pressure 100/62, pulse 93, temperature 98.5 F (36.9 C), temperature source Axillary, resp. rate (!) 35, height 5\' 11"  (1.803 m), weight 101.6 kg, SpO2 99 %.    Vent Mode: PRVC FiO2 (%):  [30 %] 30 % Set Rate:  [20 bmp] 20 bmp Vt Set:  [600 mL] 600 mL PEEP:  [5 cmH20] 5 cmH20 Plateau Pressure:  [19 cmH20-36 cmH20] 24 cmH20   Intake/Output Summary (Last 24 hours) at 05/29/2019 0849 Last data filed at 05/27/2019 0700 Gross per 24 hour  Intake 6886.51 ml  Output 950 ml  Net 5936.51 ml   Filed Weights   04/27/19 0500 04/28/19 0530 04/29/19 0500  Weight: 96.9 kg 97.1 kg 101.6 kg    Examination:  General:  In bed on vent HENT: NCAT tracheostomy in place PULM: CTA B, vent supported breathing, tachypnea CV: RRR, no mgr GI: BS+, soft, nontender MSK: normal bulk and tone Neuro: eyes open, moves to pain, tracks with eyes    Resolved Hospital Problem list     Assessment & Plan:  ARDS due to COVID 19 pneumonia, will take weeks to recover Pressures support as long as tolerated Trach care per routine vap prevention  Pseudomonas colonization tobi neb 2 weeks  Atrial fib Tele Hold amiodarone  AKI/hypernatremia Continue free water via tube  Encephalopathy: prolonged ICU delirium, severe neuromuscular  weakness; appears to be tracking on exam 11/29 and 11/30 Holding sedation  Goals of care: his wife Angel Stuart and I have had many long conversations over the last several weeks.  She feels that he is at a point where no matter what his chances of survival, he would not want to carry on with continued  life support because of the degree of suffering he has endured and must endure to survive.  She understands that I feel he could survive with ongoing care and even get back to a point where he is ambulatory.  However she and I both agree that this would take monumental effort with untold suffering in the meantime.  She is absolutely confident that he would not want that.  Plan withdrawal of care after family visit today.  Best practice:  Diet: tube feeding, replace NG today Pain/Anxiety/Delirium protocol (if indicated): RASS goal 0 VAP protocol (if indicated): yes DVT prophylaxis: lovenox bid per protocol GI prophylaxis: famotidine bid Glucose control: glargine/SSI Mobility: bed rest, passive range of motion exercises Code Status: DNR if arrests Family Communication:see note above Disposition: remain in ICU  Labs   CBC: Recent Labs  Lab 04/25/19 0130  04/26/19 0520 04/27/19 0610 04/28/19 0605 04/29/19 0555 02/20/19 0525  WBC 29.4*  --  22.8* 27.1* 29.1* 35.3* 30.5*  NEUTROABS 22.1*  --   --   --   --   --  26.3*  HGB 9.7*   < > 8.4* 7.3* 7.1* 7.7* 6.1*  HCT 34.5*   < > 29.8* 25.5* 24.1* 26.7* 20.9*  MCV 95.0  --  94.0 92.7 92.3 91.8 91.7  PLT 975*  --  861* 639* 669* 623* 397   < > = values in this interval not displayed.    Basic Metabolic Panel: Recent Labs  Lab 04/26/19 0520 04/26/19 1701 04/27/19 0610 04/28/19 0605 04/29/19 0555 02/20/19 0525  NA 158*  --  154* 154* 156* 147*  K 3.7  --  3.3* 3.5 3.8 3.2*  CL 110  --  110 112* 113* 107  CO2 35*  --  34* 33* 31 29  GLUCOSE 168*  --  106* 134* 202* 309*  BUN 122*  --  95* 64* 61* 70*  CREATININE 1.32*  --  0.96 0.87 1.16 1.06  CALCIUM 8.4*  --  8.2* 8.2* 8.5* 7.9*  MG 2.7* 2.6* 2.8* 2.6* 2.6* 2.3  PHOS 3.7 3.5 3.5 4.3 4.9* 4.1   GFR: Estimated Creatinine Clearance: 91.1 mL/min (by C-G formula based on SCr of 1.06 mg/dL). Recent Labs  Lab 04/27/19 0610 04/28/19 0605 04/29/19 0555 02/20/19 0525  WBC 27.1*  29.1* 35.3* 30.5*    Liver Function Tests: No results for input(s): AST, ALT, ALKPHOS, BILITOT, PROT, ALBUMIN in the last 168 hours. No results for input(s): LIPASE, AMYLASE in the last 168 hours. No results for input(s): AMMONIA in the last 168 hours.  ABG    Component Value Date/Time   PHART 7.540 (H) 04/26/2019 0335   PCO2ART 48.8 (H) 04/26/2019 0335   PO2ART 94.0 04/26/2019 0335   HCO3 41.7 (H) 04/26/2019 0335   TCO2 43 (H) 04/26/2019 0335   ACIDBASEDEF 3.0 (H) 03/19/2019 1442   O2SAT 98.0 04/26/2019 0335     Coagulation Profile: No results for input(s): INR, PROTIME in the last 168 hours.  Cardiac Enzymes: No results for input(s): CKTOTAL, CKMB, CKMBINDEX, TROPONINI in the last 168 hours.  HbA1C: Hgb A1c MFr Bld  Date/Time Value Ref Range Status  03/20/2019 01:00  PM 7.5 (H) 4.8 - 5.6 % Final    Comment:    (NOTE) Pre diabetes:          5.7%-6.4% Diabetes:              >6.4% Glycemic control for   <7.0% adults with diabetes     CBG: Recent Labs  Lab 04/29/19 1701 04/29/19 2015 04/29/19 2347 05-05-2019 0411 May 05, 2019 0733  GLUCAP 238* 245* 314* 290* 280*     Critical care time: 45 minutes      [place], MD [place] [facility] Pager: [phone] Cell: [phone] If no response, call [phone]

## 2019-05-31 NOTE — Progress Notes (Signed)
Spoke with patient's wife, [name], and plan to have a family visit at 1700 this evening.

## 2019-05-31 NOTE — Progress Notes (Signed)
Time of death  

## 2019-05-31 NOTE — Progress Notes (Signed)
Pt made comfort care.  Decline is expected.  RT will monitor.

## 2019-05-31 NOTE — Progress Notes (Signed)
noteable changes to patient's vital signs. Agonal breaths. Family called and able to facetime patient until he passed at 2105.

## 2019-05-31 DEATH — deceased

## 2021-05-11 IMAGING — DX DG CHEST 1V PORT
1 series · 1 of 1 positions shown · non-contrast
Comparison: Chest radiograph 03/25/2019

CLINICAL DATA: Acute respiratory failure

EXAM:
PORTABLE CHEST 1 VIEW

[chest ap]
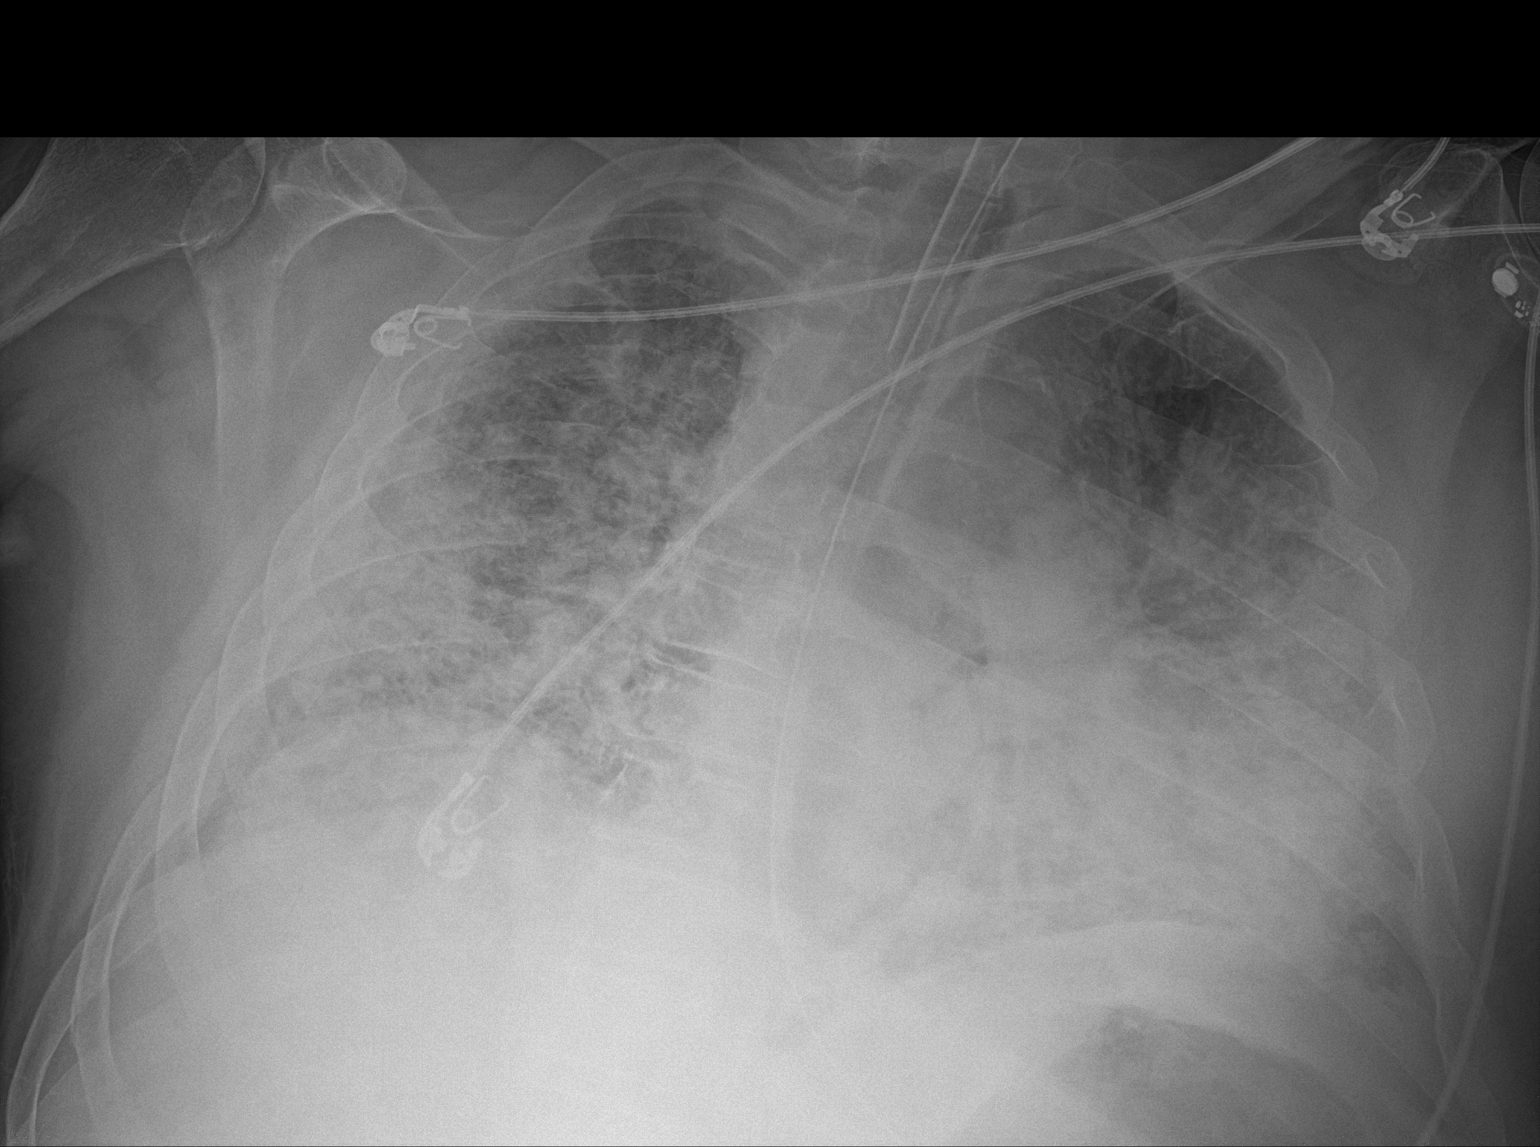

[1 of 1 positions shown; findings below may reference images not displayed]

FINDINGS: The endotracheal tube tip terminates between the thoracic inlet and
carina. Nasogastric tube remains in place with distal aspect below
the diaphragm and out of field of view. Similar appearance of the
lungs with extensive bilateral airspace opacities. No pneumothorax
or large pleural effusion. No acute finding in the visualized
skeleton.
IMPRESSION: Stable support apparatus and extensive bilateral airspace disease.

## 2021-05-11 IMAGING — DX DG ABDOMEN 1V
1 series · 1 of 1 positions shown · non-contrast
Comparison: None.

CLINICAL DATA: Central line and NG tube placement

EXAM:
ABDOMEN - 1 VIEW

[abdomen kub]
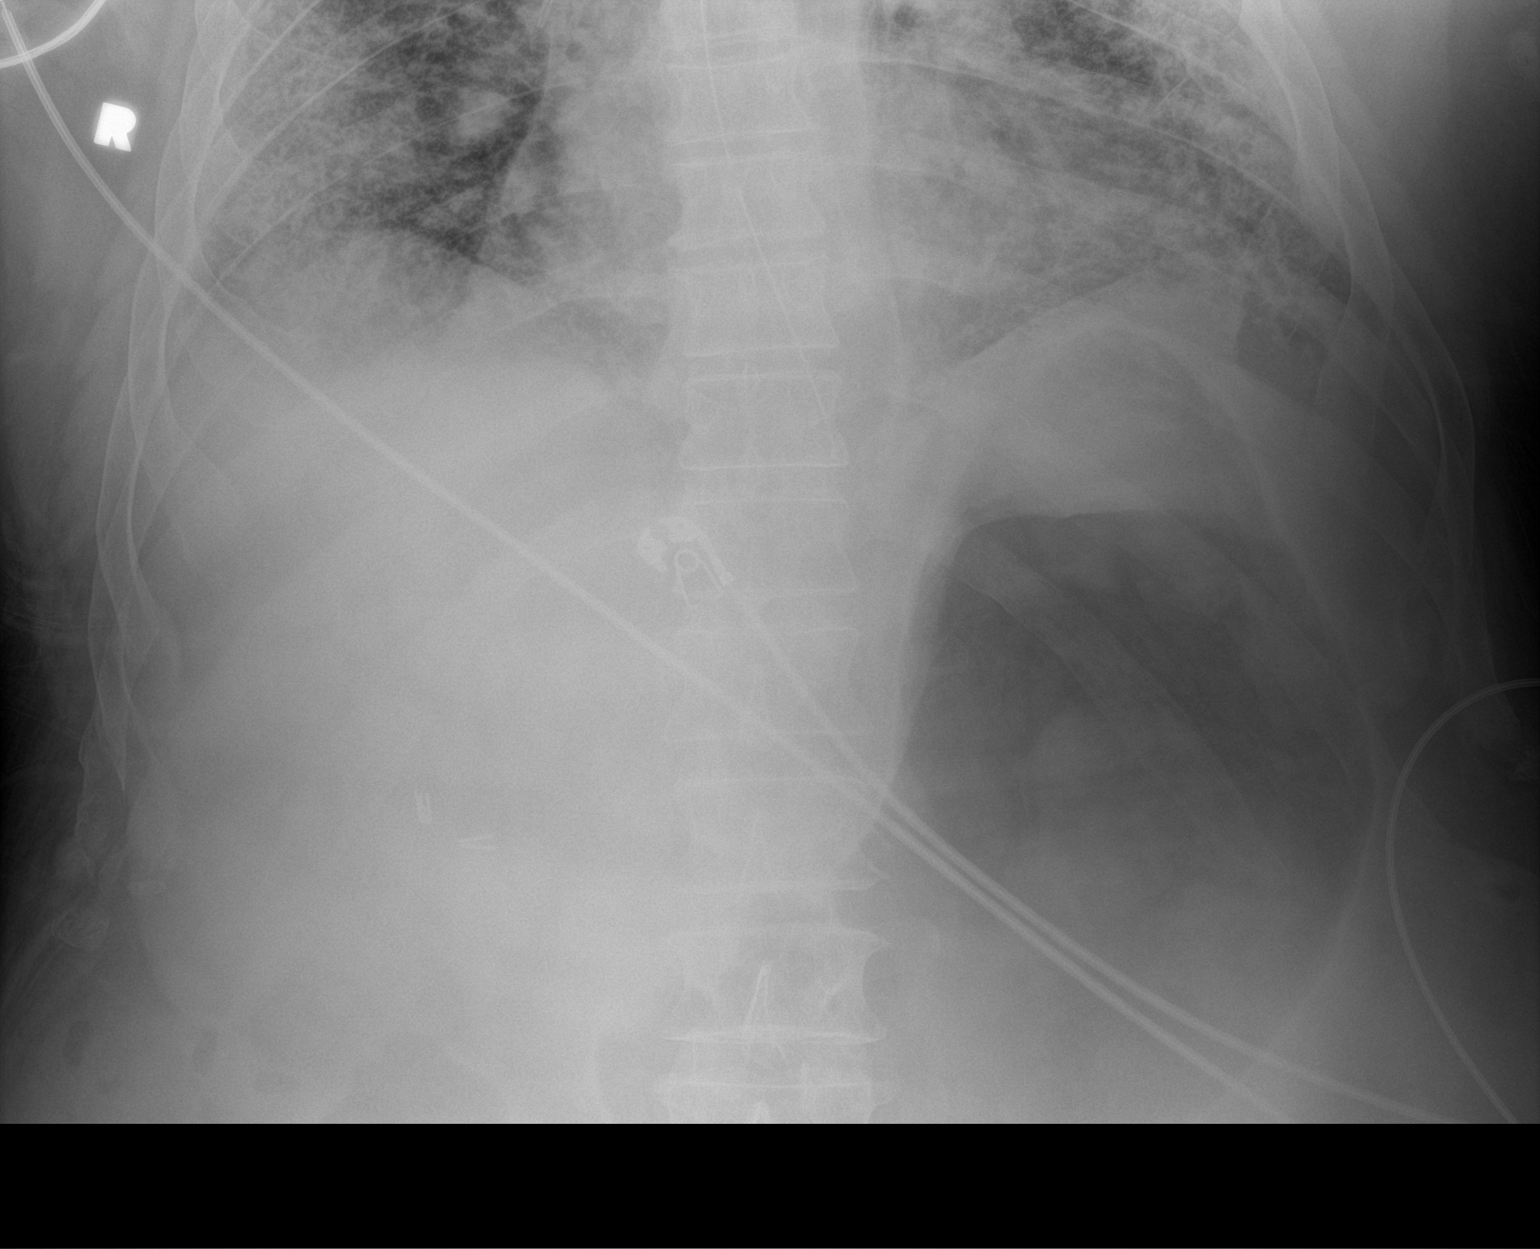

[1 of 1 positions shown; findings below may reference images not displayed]

FINDINGS: The nasogastric tube side port projects over the distal esophagus.
Gastric distention. The bowel gas pattern cannot be adequately
assessed given the limited field of view. No evidence for free air.
IMPRESSION: 1. The nasogastric tube side-port projects over the distal
esophagus. Recommend advancement of approximately 15 cm into the
stomach.
2. Gastric distention.

## 2021-05-12 IMAGING — DX DG ABDOMEN 1V
1 series · 1 of 1 positions shown · non-contrast
Comparison: Chest x-ray same day.  KUB 03/25/2019.

CLINICAL DATA: NG tube advancement.

EXAM:
ABDOMEN - 1 VIEW

[abdomen kub]
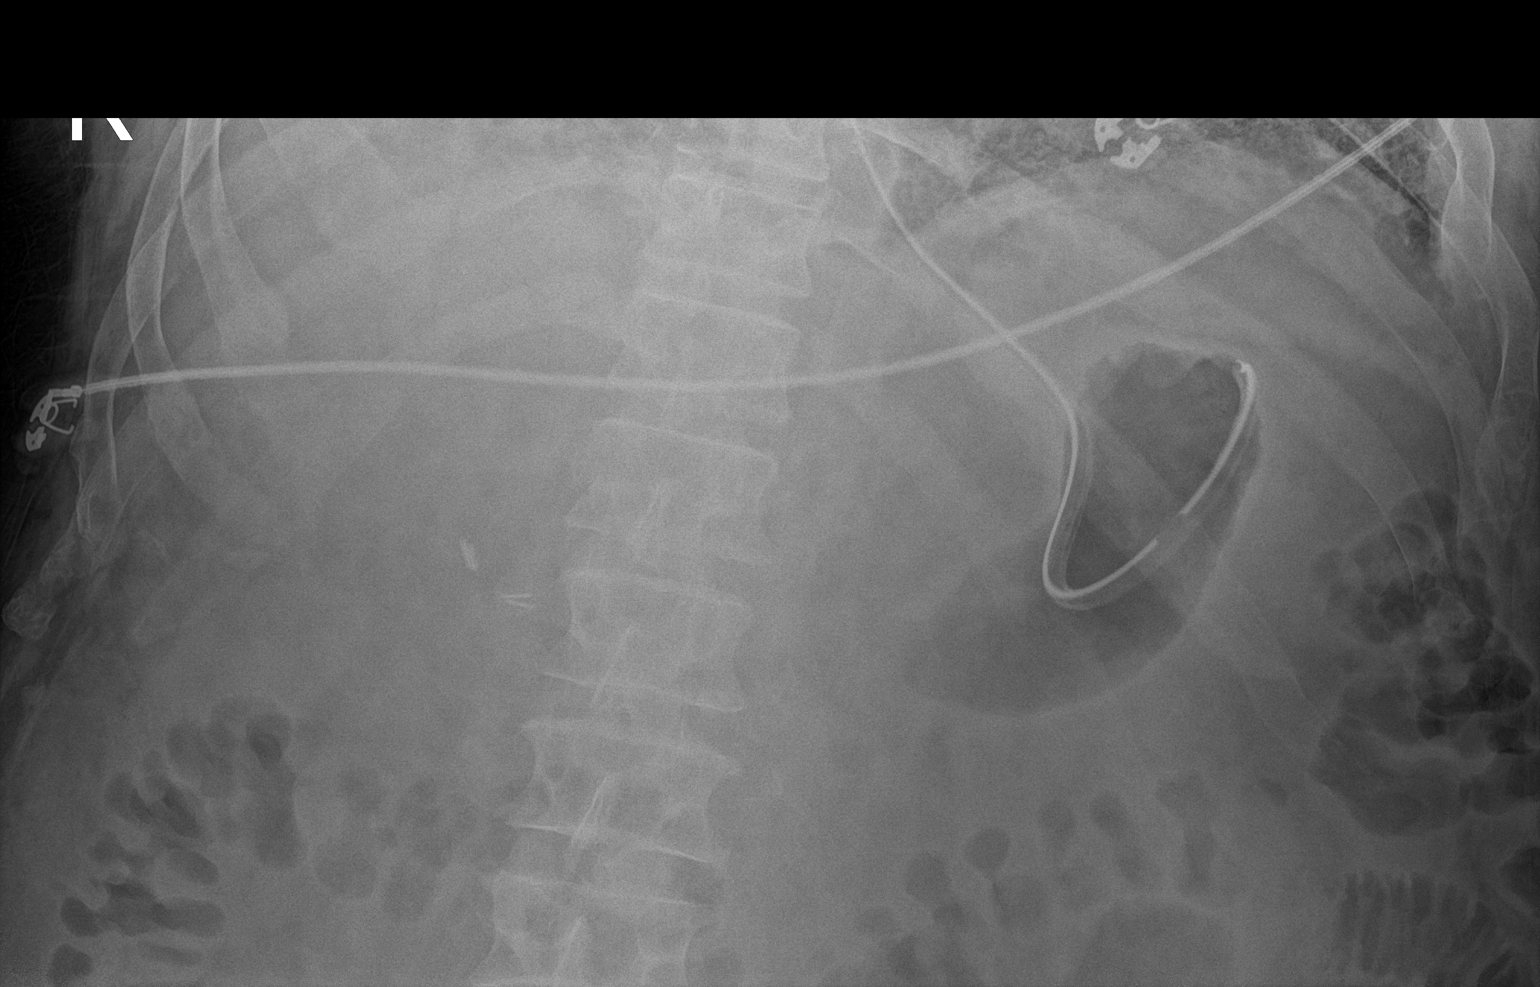

[1 of 1 positions shown; findings below may reference images not displayed]

FINDINGS: NG tube noted with tip coiled stomach. No bowel distention.
Bibasilar pulmonary infiltrates. Surgical clips right upper
quadrant.
IMPRESSION: NG tube noted with tip coiled in stomach.

## 2021-05-13 IMAGING — DX DG CHEST 1V PORT
1 series · 1 of 1 positions shown · non-contrast
Comparison: March 26, 2019.

CLINICAL DATA: Endotracheal tube placement.

EXAM:
PORTABLE CHEST 1 VIEW

[chest ap]
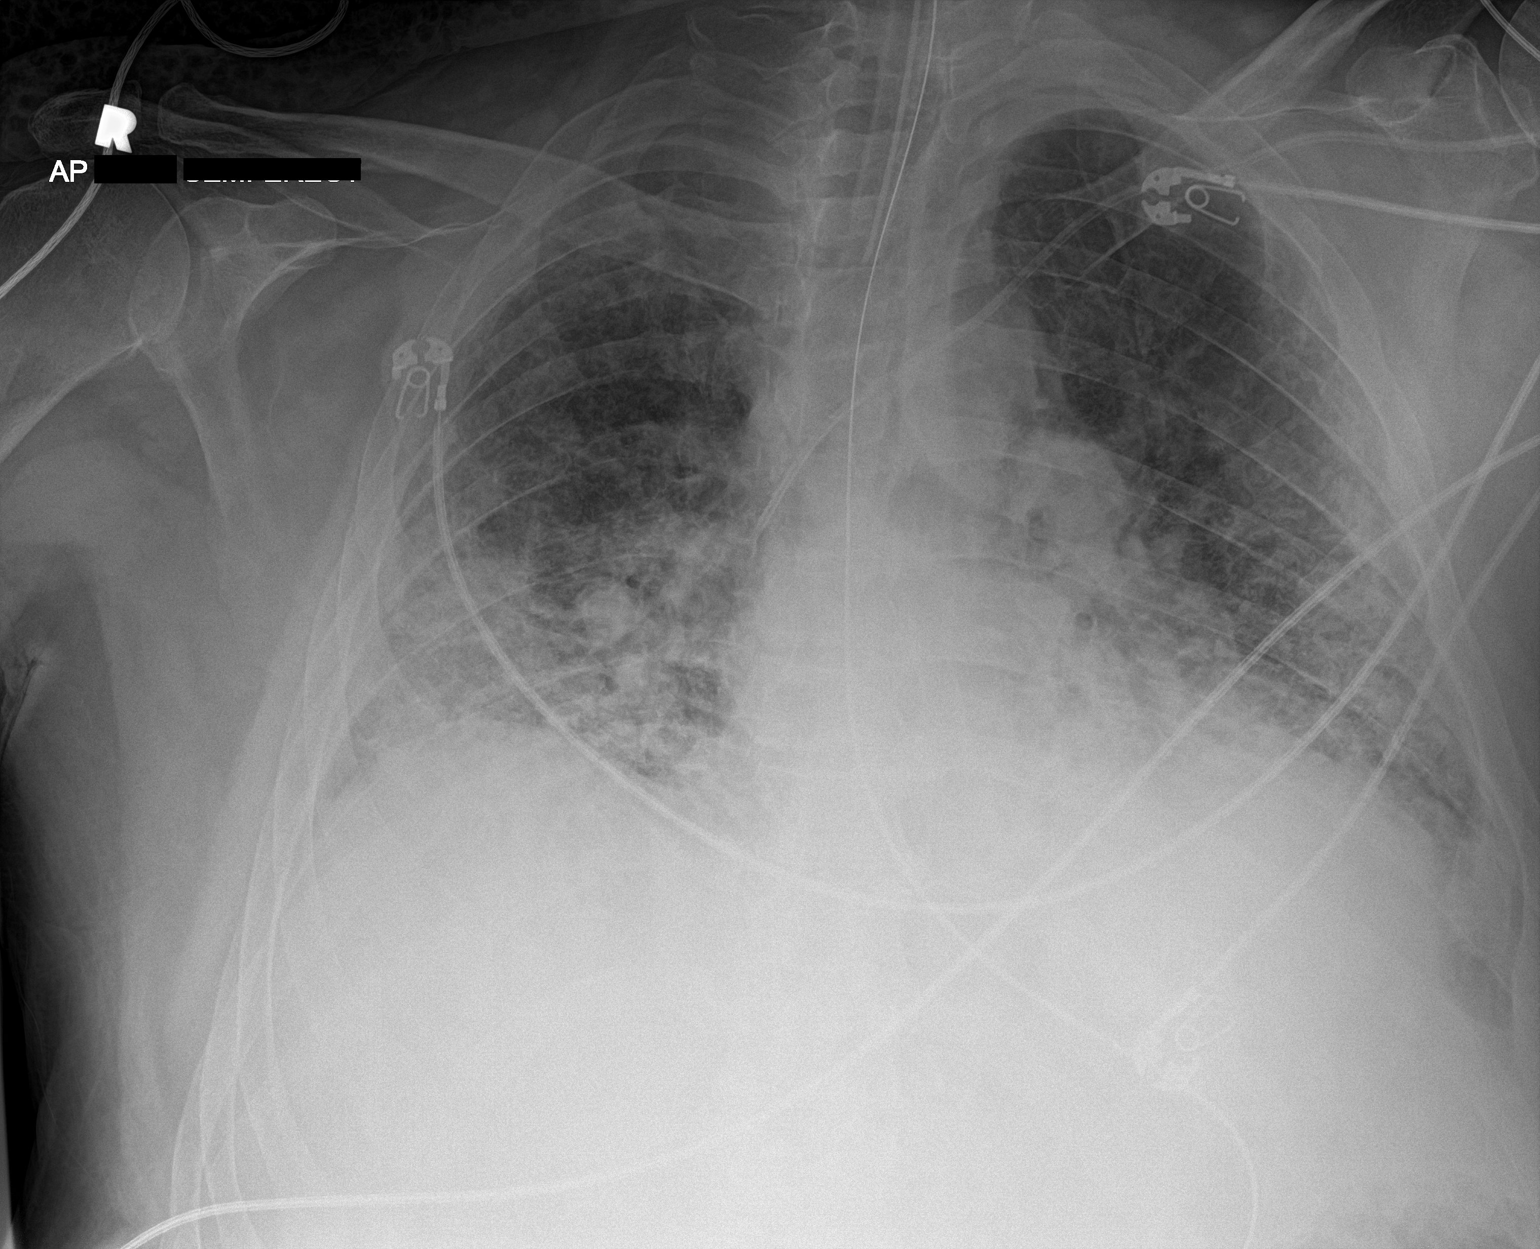

[1 of 1 positions shown; findings below may reference images not displayed]

FINDINGS: Stable cardiomediastinal silhouette. Endotracheal and nasogastric
tubes are unchanged in position. Left subclavian catheter is
unchanged. No pneumothorax is noted. Stable bilateral lung opacities
are noted concerning for multifocal pneumonia. Bony thorax is
unremarkable.
IMPRESSION: Stable support apparatus. Stable bilateral lung opacities are noted
concerning for multifocal pneumonia.

## 2021-05-14 IMAGING — DX DG CHEST 1V PORT
1 series · 1 of 1 positions shown · non-contrast
Comparison: Portable exam 0890 hours

CLINICAL DATA: Intubation, [id number]

EXAM:
PORTABLE CHEST 1 VIEW

[chest ap]
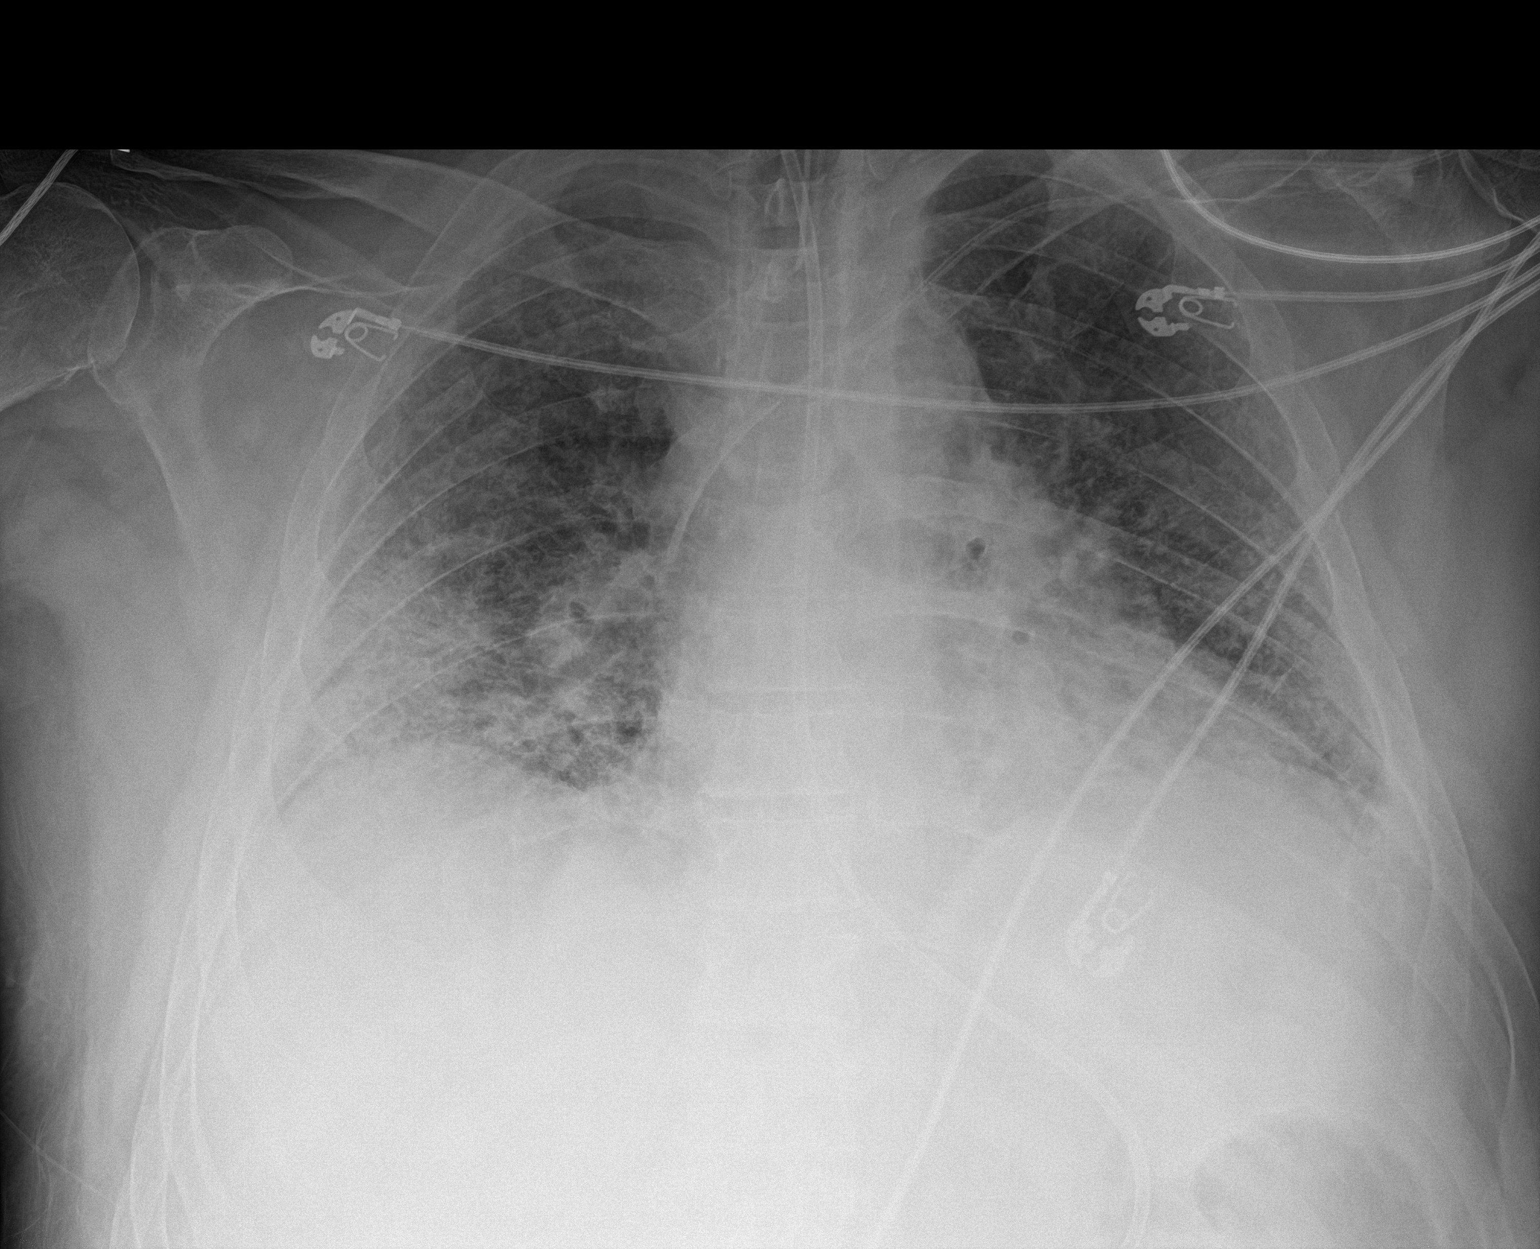

[1 of 1 positions shown; findings below may reference images not displayed]

FINDINGS: Tip of endotracheal tube projects 6.2 cm above carina.

Feeding tube extends into stomach.

LEFT subclavian line with tip projecting over SVC.

Stable heart size and mediastinal contours.

BILATERAL airspace infiltrates greatest at RIGHT base, slightly
improved.

No pleural effusion or pneumothorax.

Bones demineralized.
IMPRESSION: Slightly improved airspace infiltrates.
